# Patient Record
Sex: Male | Born: 1965 | Race: Black or African American | Hispanic: No | Marital: Single | Smoking: Current every day smoker
Health system: Southern US, Community
[De-identification: ages and names within clinical notes are randomized; demographics above are authoritative.]

## PROBLEM LIST (undated history)

## (undated) DIAGNOSIS — F209 Schizophrenia, unspecified: Secondary | ICD-10-CM

## (undated) DIAGNOSIS — J189 Pneumonia, unspecified organism: Secondary | ICD-10-CM

## (undated) DIAGNOSIS — M1990 Unspecified osteoarthritis, unspecified site: Secondary | ICD-10-CM

## (undated) DIAGNOSIS — I639 Cerebral infarction, unspecified: Secondary | ICD-10-CM

## (undated) DIAGNOSIS — M5136 Other intervertebral disc degeneration, lumbar region: Secondary | ICD-10-CM

## (undated) DIAGNOSIS — F32A Depression, unspecified: Secondary | ICD-10-CM

## (undated) DIAGNOSIS — R51 Headache: Secondary | ICD-10-CM

## (undated) DIAGNOSIS — F319 Bipolar disorder, unspecified: Secondary | ICD-10-CM

## (undated) DIAGNOSIS — R519 Headache, unspecified: Secondary | ICD-10-CM

## (undated) DIAGNOSIS — E119 Type 2 diabetes mellitus without complications: Secondary | ICD-10-CM

## (undated) DIAGNOSIS — M545 Low back pain, unspecified: Secondary | ICD-10-CM

## (undated) DIAGNOSIS — G8929 Other chronic pain: Secondary | ICD-10-CM

## (undated) DIAGNOSIS — M51369 Other intervertebral disc degeneration, lumbar region without mention of lumbar back pain or lower extremity pain: Secondary | ICD-10-CM

## (undated) DIAGNOSIS — F1310 Sedative, hypnotic or anxiolytic abuse, uncomplicated: Secondary | ICD-10-CM

## (undated) DIAGNOSIS — K219 Gastro-esophageal reflux disease without esophagitis: Secondary | ICD-10-CM

## (undated) DIAGNOSIS — I10 Essential (primary) hypertension: Secondary | ICD-10-CM

## (undated) DIAGNOSIS — F1210 Cannabis abuse, uncomplicated: Secondary | ICD-10-CM

## (undated) DIAGNOSIS — G43909 Migraine, unspecified, not intractable, without status migrainosus: Secondary | ICD-10-CM

## (undated) DIAGNOSIS — F329 Major depressive disorder, single episode, unspecified: Secondary | ICD-10-CM

## (undated) DIAGNOSIS — F1410 Cocaine abuse, uncomplicated: Secondary | ICD-10-CM

## (undated) DIAGNOSIS — F1010 Alcohol abuse, uncomplicated: Secondary | ICD-10-CM

## (undated) DIAGNOSIS — F419 Anxiety disorder, unspecified: Secondary | ICD-10-CM

## (undated) HISTORY — PX: NECK SURGERY: SHX720

## (undated) HISTORY — PX: BACK SURGERY: SHX140

## (undated) HISTORY — PX: LUMBAR DISC SURGERY: SHX700

---

## 1998-02-12 ENCOUNTER — Emergency Department (HOSPITAL_COMMUNITY): Admission: EM | Admit: 1998-02-12 | Discharge: 1998-02-12 | Payer: Self-pay | Admitting: Emergency Medicine

## 1998-02-25 ENCOUNTER — Encounter: Payer: Self-pay | Admitting: Family Medicine

## 1998-02-25 ENCOUNTER — Ambulatory Visit (HOSPITAL_COMMUNITY): Admission: RE | Admit: 1998-02-25 | Discharge: 1998-02-25 | Payer: Self-pay | Admitting: Family Medicine

## 1998-07-06 ENCOUNTER — Emergency Department (HOSPITAL_COMMUNITY): Admission: EM | Admit: 1998-07-06 | Discharge: 1998-07-06 | Payer: Self-pay | Admitting: Emergency Medicine

## 1999-01-08 ENCOUNTER — Emergency Department (HOSPITAL_COMMUNITY): Admission: EM | Admit: 1999-01-08 | Discharge: 1999-01-08 | Payer: Self-pay | Admitting: Emergency Medicine

## 1999-01-15 ENCOUNTER — Emergency Department (HOSPITAL_COMMUNITY): Admission: EM | Admit: 1999-01-15 | Discharge: 1999-01-15 | Payer: Self-pay

## 1999-02-03 ENCOUNTER — Encounter: Admission: RE | Admit: 1999-02-03 | Discharge: 1999-02-25 | Payer: Self-pay | Admitting: Orthopedic Surgery

## 1999-02-10 ENCOUNTER — Ambulatory Visit (HOSPITAL_COMMUNITY): Admission: RE | Admit: 1999-02-10 | Discharge: 1999-02-10 | Payer: Self-pay | Admitting: Orthopedic Surgery

## 1999-02-10 ENCOUNTER — Encounter: Payer: Self-pay | Admitting: Orthopedic Surgery

## 1999-03-29 ENCOUNTER — Encounter: Payer: Self-pay | Admitting: Emergency Medicine

## 1999-03-29 ENCOUNTER — Emergency Department (HOSPITAL_COMMUNITY): Admission: EM | Admit: 1999-03-29 | Discharge: 1999-03-29 | Payer: Self-pay | Admitting: *Deleted

## 1999-05-21 ENCOUNTER — Encounter: Payer: Self-pay | Admitting: Neurosurgery

## 1999-05-21 ENCOUNTER — Ambulatory Visit (HOSPITAL_COMMUNITY): Admission: RE | Admit: 1999-05-21 | Discharge: 1999-05-21 | Payer: Self-pay | Admitting: Neurosurgery

## 1999-05-24 ENCOUNTER — Encounter: Payer: Self-pay | Admitting: Neurosurgery

## 1999-05-28 ENCOUNTER — Encounter: Payer: Self-pay | Admitting: Neurosurgery

## 1999-05-29 ENCOUNTER — Encounter: Payer: Self-pay | Admitting: Neurosurgery

## 1999-05-29 ENCOUNTER — Observation Stay (HOSPITAL_COMMUNITY): Admission: RE | Admit: 1999-05-29 | Discharge: 1999-05-31 | Payer: Self-pay | Admitting: Neurosurgery

## 1999-08-07 ENCOUNTER — Encounter: Payer: Self-pay | Admitting: Emergency Medicine

## 1999-08-07 ENCOUNTER — Emergency Department (HOSPITAL_COMMUNITY): Admission: EM | Admit: 1999-08-07 | Discharge: 1999-08-07 | Payer: Self-pay | Admitting: Emergency Medicine

## 1999-08-13 ENCOUNTER — Emergency Department (HOSPITAL_COMMUNITY): Admission: EM | Admit: 1999-08-13 | Discharge: 1999-08-13 | Payer: Self-pay | Admitting: *Deleted

## 2000-10-11 ENCOUNTER — Encounter: Payer: Self-pay | Admitting: Emergency Medicine

## 2000-10-11 ENCOUNTER — Emergency Department (HOSPITAL_COMMUNITY): Admission: EM | Admit: 2000-10-11 | Discharge: 2000-10-12 | Payer: Self-pay | Admitting: Emergency Medicine

## 2000-10-23 ENCOUNTER — Emergency Department (HOSPITAL_COMMUNITY): Admission: EM | Admit: 2000-10-23 | Discharge: 2000-10-23 | Payer: Self-pay | Admitting: Emergency Medicine

## 2001-01-09 ENCOUNTER — Inpatient Hospital Stay (HOSPITAL_COMMUNITY): Admission: AD | Admit: 2001-01-09 | Discharge: 2001-01-14 | Payer: Self-pay | Admitting: Psychiatry

## 2002-10-17 ENCOUNTER — Emergency Department (HOSPITAL_COMMUNITY): Admission: EM | Admit: 2002-10-17 | Discharge: 2002-10-18 | Payer: Self-pay | Admitting: Emergency Medicine

## 2007-05-12 ENCOUNTER — Emergency Department (HOSPITAL_COMMUNITY): Admission: EM | Admit: 2007-05-12 | Discharge: 2007-05-12 | Payer: Self-pay | Admitting: Emergency Medicine

## 2007-06-13 ENCOUNTER — Emergency Department (HOSPITAL_COMMUNITY): Admission: EM | Admit: 2007-06-13 | Discharge: 2007-06-13 | Payer: Self-pay | Admitting: Emergency Medicine

## 2008-09-25 DIAGNOSIS — J189 Pneumonia, unspecified organism: Secondary | ICD-10-CM

## 2008-09-25 HISTORY — DX: Pneumonia, unspecified organism: J18.9

## 2008-10-16 ENCOUNTER — Ambulatory Visit: Payer: Self-pay | Admitting: Diagnostic Radiology

## 2008-10-16 ENCOUNTER — Emergency Department (HOSPITAL_BASED_OUTPATIENT_CLINIC_OR_DEPARTMENT_OTHER): Admission: EM | Admit: 2008-10-16 | Discharge: 2008-10-16 | Payer: Self-pay | Admitting: Emergency Medicine

## 2008-12-19 ENCOUNTER — Emergency Department (HOSPITAL_BASED_OUTPATIENT_CLINIC_OR_DEPARTMENT_OTHER): Admission: EM | Admit: 2008-12-19 | Discharge: 2008-12-19 | Payer: Self-pay | Admitting: Emergency Medicine

## 2009-03-05 ENCOUNTER — Emergency Department (HOSPITAL_COMMUNITY): Admission: EM | Admit: 2009-03-05 | Discharge: 2009-03-05 | Payer: Self-pay | Admitting: Emergency Medicine

## 2009-03-06 ENCOUNTER — Ambulatory Visit: Payer: Self-pay | Admitting: Vascular Surgery

## 2009-07-02 ENCOUNTER — Emergency Department (HOSPITAL_BASED_OUTPATIENT_CLINIC_OR_DEPARTMENT_OTHER): Admission: EM | Admit: 2009-07-02 | Discharge: 2009-07-02 | Payer: Self-pay | Admitting: Emergency Medicine

## 2010-01-13 ENCOUNTER — Emergency Department (HOSPITAL_BASED_OUTPATIENT_CLINIC_OR_DEPARTMENT_OTHER)
Admission: EM | Admit: 2010-01-13 | Discharge: 2010-01-13 | Payer: Self-pay | Source: Home / Self Care | Admitting: Emergency Medicine

## 2010-05-01 LAB — DIFFERENTIAL
Basophils Relative: 3 % — ABNORMAL HIGH (ref 0–1)
Eosinophils Absolute: 0.2 10*3/uL (ref 0.0–0.7)
Monocytes Relative: 9 % (ref 3–12)
Neutrophils Relative %: 71 % (ref 43–77)

## 2010-05-01 LAB — CBC
MCHC: 34.6 g/dL (ref 30.0–36.0)
MCV: 87 fL (ref 78.0–100.0)
Platelets: 333 10*3/uL (ref 150–400)

## 2010-05-01 LAB — BASIC METABOLIC PANEL
BUN: 15 mg/dL (ref 6–23)
CO2: 31 mEq/L (ref 19–32)
Chloride: 102 mEq/L (ref 96–112)
Creatinine, Ser: 1.2 mg/dL (ref 0.4–1.5)
Glucose, Bld: 111 mg/dL — ABNORMAL HIGH (ref 70–99)

## 2010-06-09 NOTE — Consult Note (Signed)
NEW PATIENT CONSULTATION   Lonnie [name], Lonnie [name]  DOB:  1965-10-21                                       03/06/2009  ZOXWR#:[id number]   I saw the patient in the office today in consultation concerning pain in  his left first, second and third toes.  He was referred by the emergency  department at [facility].  This is a pleasant 45 year old  gentleman who states that approximately a month ago he noted the fairly  sudden onset of pain in the first, second and third toes of his left  foot.  Over the last month the pain gradually worsened.  He experienced  pain whenever he was putting his socks on and then when there was any  pressure on his toes.  The only alleviating factor has been pain  medicines when he received these in the emergency room last night.  He  does describe some mild pain in his distal left foot but it does not  sound like classic calf claudication.  He has had no real rest pain.  He  has had no history of nonhealing wounds.  He did have his left great  toenail removed about 4 years ago.  He has had no associated symptoms  besides the pain in his toes.  He has not really noticed any  discoloration in his toes.   PAST MEDICAL HISTORY:  Significant for adult onset diabetes.  He does  not require insulin.  This has been stable on his current medications.  He also states that he has had some history of hypertension in the past  but has not had a problem with this recently.  He denies any history of  hypercholesterolemia, history of previous myocardial infarction, history  of congestive heart failure or history of COPD.   FAMILY HISTORY:  There is no history of premature cardiovascular  disease.   SOCIAL HISTORY:  He is single.  He has three children.  He smokes  occasional cigars but denies using cigarettes currently.  He has used  cigarettes in the past.   REVIEW OF SYSTEMS:  GENERAL:  He has had no recent weight loss, weight  gain or problems  with his appetite.  He has had some occasional chest  tightness which is relieved by stretching.  He has had no palpitations  or arrhythmias.  PULMONARY:  He has had occasional productive cough, occasional wheezing.  He has had no bronchitis or asthma.  GI:  He has had some reflux and history of diarrhea and constipation.  GU:  He has had some urinary frequency.  He has had no dysuria.  NEUROLOGIC:  He has had no history of stroke, TIAs or amaurosis fugax.  He has had no history of DVT or phlebitis.  He has occasional dizziness  and occasional headaches.  MUSCULOSKELETAL:  He does have occasional joint pain and muscle pain.  ENT:  He states that he has had some problems with his eyesight  recently.  He has had no change in hearing.  No history of nosebleeds or  sore throat.  Psychiatric, hematologic review of systems is unremarkable.  INTEGUMENTARY:  He does state he has had a callus on the bottom of his  right foot.   PHYSICAL EXAMINATION:  General:  This is a pleasant 45 year old  gentleman who appears his stated age.  Vital signs:  His blood pressure  is 124/74, respiratory rate 20, heart rate is 80.  HEENT:  Is  unremarkable.  Lungs:  Are clear bilaterally to auscultation without  rales, rhonchi or wheezing.  Cardiovascular:  I do not detect any  carotid bruits.  He has a regular rate and rhythm without murmur  appreciated.  He has no significant peripheral edema.  He has palpable  radial pulses bilaterally.  He has palpable femoral, popliteal, dorsalis  pedis and posterior tibial pulses bilaterally.  He has no ischemic  ulcers on his feet.  No discoloration of his toes.  Abdomen:  Soft and  nontender with normal pitched bowel sounds.  No masses appreciated.  I  cannot palpate an aneurysm.  Musculoskeletal:  He has no major  deformities or cyanosis.  There is no discoloration to his toes.  No  cyanosis of the toes.  Neurological:  He has no focal weakness or  paresthesias.  Skin:   He does have a callus on the plantar aspect of his  right foot.  He has no ulcers.  Lymphatic:  He has no significant  cervical, axillary or inguinal lymphadenopathy.   I did independently interpret his arterial Doppler study today which  shows biphasic Doppler signals in the dorsalis pedis and posterior  tibial positions bilaterally.  He has an ABI of 100% bilaterally.  Toe  pressure on the right is 103 mmHg and on the left 102 mmHg.   I have reassured the patient that based on his exam and Doppler study he  has no evidence of significant arterial insufficiency.  He has had some  pain in his toes but I really do not see any evidence of atheroembolic  disease.  He does not have any significant risk factors for having  proximal disease and there is no discoloration in the toes to suggest  atheroembolic disease.  He does have a history of diabetes and certainly  some of his pain could be neuropathy.  The other possibility would be  gout.  I have recommended that he see a podiatrist and they can do x-  rays at the office or he can be seen at the [facility] and we will try  to make these referrals.  Again, however, I reassured him that he has no  evidence of significant peripheral vascular disease and I think it is  very unlikely he has had any type of an embolic event.  We will see him  back p.r.n.     Di Kindle. Edilia Bo, M.D.  Electronically Signed   CSD/MEDQ  D:  03/06/2009  T:  03/07/2009  Job:  [id number]

## 2010-06-12 NOTE — H&P (Signed)
[facility]  Patient:    Lonnie [name], Lonnie [name] Visit Number: [id number] MRN: [id number]          Service Type: PSY Location: 300 0301 01 Attending Physician:  Jeanice Lim Dictated by:   Candi Leash. Orsini, N.P. Admit Date:  01/09/2001                     Psychiatric Admission Assessment  IDENTIFYING INFORMATION:  A 45 year old divorced African-American male involuntarily committed for depression and suicidal ideation and polysubstance abuse.  HISTORY OF PRESENT ILLNESS:  The patient presents with a history of alcohol abuse, cocaine dependence, and depressive symptoms for months that had increased, was having suicidal thoughts, the patients thoughts to smoke enough cocaine to blow his heart up.  He reports no sleep for the past 5 nights.  He is experiencing positive auditory hallucinations to kill himself, having a decreased appetite with an 18 pound weight loss.  No current suicidal or homicidal ideations, no psychosis.  He has been drinking 4-5 22-ounce beers for the past few days to calm himself down from cocaine binges.  The patient denies any withdrawal symptoms at present.  PAST PSYCHIATRIC HISTORY:  First visit to [facility].  He was detoxed in September at [facility], had been sober for 1-1/2 months.  Longest period of sobriety was in 1994 and clean for 3 years.  SOCIAL HISTORY:  He is a 45 year old divorced African-American male, divorced for 15 years, 2 children ages 107 and 21.  He lives alone, no legal charges.  He has completed 1 year of college.  He is unemployed.  FAMILY HISTORY:  None.  ALCOHOL DRUG HISTORY:  The patient smokes cigarettes, has been drinking 4 to 5 22-ounce beers for the past several days.  Last drink was on Sunday morning. Has been using crack cocaine for the past 15 years every day and reports a history of blackouts.  PAST MEDICAL HISTORY:  Primary care Wanell Lorenzi is none.  Medical problems  are none.  Medications are none.  DRUG ALLERGIES:  No known allergies.  PHYSICAL EXAMINATION  REVIEW OF SYSTEMS:  The patient had back surgery in the year 2000.  No other current medical problems.  Occasional migraine headaches.  No heart rate or pulmonary symptoms.  No endocrine or GI problems.  Patient reports some history of insomnia.  GENERAL APPEARANCE:    The patient is 5 feet 11 inches tall.  He is 184 pounds.  Last vital signs 97.6, 73, 24, blood pressure 123/67.  Patient is a 45 year old African-American male, well-nourished, in no acute distress. HEAD:  Normocephalic.  His hair is short, clean and of equal distribution. EYES:  His EOMs are intact bilaterally.  No sinus tenderness or nasal discharge.  Mucosa is moist with good dentition.  NECK:  Supple, no JVD, negative lymphadenopathy.  Thyroid is nonpalpable and nontender.  CHEST:  Clear to auscultation.  No cough.  HEART:  Regular rate and rhythm, without murmurs, gallops or rubs. GENITALIA EXAM is deferred.  ABDOMEN:  Soft, nontender abdomen.  No CVA tenderness.  MUSCULOSKELETAL:  No joint swelling or deformity.  Good range of motion. SKIN:  Warm and dry with good turgor.  No lacerations or rashes.  NEUROLOGIC:  He is oriented x 3.  Cranial nerves are grossly intact.  LABORATORY DATA:  CBC is within normal limits.  Total protein is decreased at 5.8.  MENTAL STATUS EXAMINATION:  He is an awake but sleep, well-nourished African-American  male.  He is cooperative, casually dressed,  neatly dressed. Speech is normal and relevant.  Mood is depressed.  Affect is difficult to ascertain.  The patient is too sleepy.  Thought processes are coherent.  There is no current auditory or visual hallucinations.  He does not appear to be responding to any internal stimuli.  No suicidal or homicidal ideations, no paranoia.  Cognitively, he is oriented x 3.  Judgment is impaired.  Insight is fair.  Poor impulse  control.  ADMISSION DIAGNOSES: Axis I:    1. Mood disorder not otherwise specified.            2. Alcohol abuse.            3. Cocaine dependence. Axis II:   Deferred. Axis III:  None. Axis IV:   Mild to moderate, psychosocial problems. Axis V:    Current 30, estimated this past year 52.  INITIAL PLAN OF CARE:  Involuntary commitment for depression and suicidal ideation, alcohol and cocaine abuse.  Contract for safety, check every 15 minutes.  The patient promises safety.  We will obtain labs and patient is to attend groups.  We will initiate an antidepressant, have Symmetrel available for withdrawal symptoms and Zyprexa for sleep and psychosis.  Will monitor withdrawal symptoms.  Our goal is to stabilize mood and thinking so the patient can be safe, for patient to be detoxed safely, for patient to remain drug and alcohol free after discharge, attend mental health and AA and NA meeting.  TENTATIVE LENGTH OF STAY:  4 to 5 days.Dictated by:   Candi Leash. Orsini, N.P.  Attending Physician:  Jeanice Lim DD:  01/11/01 TD:  01/12/01 Job: [id number]
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
## 2010-06-12 NOTE — Discharge Summary (Signed)
[facility]  Patient:    THAILAND, DUBE Visit Number: [id number] MRN: [id number]          Service Type: PSY Location: 300 0301 01 Attending Physician:  Jeanice Lim Dictated by:   Jeanice Lim, M.D. Admit Date:  01/09/2001 Discharge Date: 01/14/2001                             Discharge Summary  IDENTIFYING DATA:             Buchmann is a 45 year old divorced, African-American male who voluntarily committed for depression, suicidal ideation, polysubstance abuse.  ALLERGIES:                    No known drug allergies.  PHYSICAL EXAMINATION:         Unremarkable.  Neurologically, nonfocal.  ROUTINE ADMISSION LABS:       Within normal limits including CBC with differential, comprehensive metabolic panel, thyroid panel with a normal TSH of 0.793.  MENTAL STATUS EXAMINATION:    Awake, sleepy, well-nourished, African-American male, casually dressed, neat.  Speech was within normal limits.  Mood depressed.  Affect somewhat restricted.  Thought process goal directed by content.  No current psychotic symptoms.  He denied suicidal or homicidal ideation.  Cognitively intact.  Judgment and insight were considered impaired.  ADMITTING DIAGNOSES: Axis I;    1. Alcohol abuse.            2. Cocaine dependence.            3. Mood disorder, not otherwise specified. Axis II:   None. Axis III:  None. Axis IV:   Moderate, other psychosocial problems. Axis V:    30/65.  Routine p.r.n. medications were ordered.  The patient had been on a 7-day binge with marijuana, alcohol and cocaine and reported auditory hallucinations with suicidal content.  He was given thiamine and multivitamin, phenobarbital p.r.n. withdrawal symptoms and amantadine for cocaine cravings and Gatorade for potassium supplementation, Wellbutrin was optimized and Zyprexa scheduled for psychotic symptoms and Wellbutrin targeting depressive symptoms.  The patient reported oversedation from  Zyprexa and requested Seroquel which was optimized and the patient reported a positive response to medications and no side effects.  He denied active withdrawal symptoms and his condition at discharge was improved.  Mood was more euthymic.  Affect bright.  Thought process goal directed for content.  Negative for psychotic symptoms.  No dangerous ideation.  The patient reported motivation to be sober and to be compliant with follow up plans.  DISCHARGE MEDICATIONS: 1. Seroquel 150 mg q.h.s. 2. Symmetrel 100 mg b.i.d. 3. Wellbutrin SR 150 mg q.a.m. and q.3 p.m.  DISCHARGE DIAGNOSES: Axis I:    1. Alcohol abuse.            2. Cocaine dependence.            3. Mood disorder, not otherwise specified. Axis II:   None. Axis III:  None. Axis IV:   Moderate, other psychosocial problems. Axis V:    GAF on discharge was 55.  FURTHER EVALUATION AND TREATMENT RECOMMENDATIONS:    He was discharged to follow up with the mental health center for medication management and substance abuse       treatment. ictated by:   Jeanice Lim, M.D. Attending Physician:  Jeanice Lim DD:  02/21/01 TD:  02/21/01 Job: [id number]

## 2010-06-12 NOTE — Discharge Summary (Signed)
[place]. [facility]  Patient:    Lonnie [name], Lonnie [name]                       MRN: [id number] Adm. Date:  [id number] Disc. Date: [id number] Attending:  Coletta Memos                           Discharge Summary  REASON FOR ADMISSION: 1. Herniated lumbar disk L5-S1 left. 2. Left lumbar radiculopathy.  FINAL DIAGNOSES: 1. Herniated lumbar disk L5-S1 left. 2. Left lumbar radiculopathy.  HISTORY OF PRESENT ILLNESS AND HOSPITAL COURSE:  Lonnie [name] is a 45 year old man with significant left lower extremity radicular pain.  He underwent L4-5 microdiskectomy on the left.  He had improvement in his radicular pain. Postoperatively, he had some persistent incisional pain.  On May 30, 1999, complained of some crushing chest pain on the left.  An EKG was obtained, which was normal.  The patient had improvement of his chest pain with Mylanta. He was observed in the hospital after that, had no recurrent episodes of chest pain.  Was doing well on May 31, 1999, and was up and ambulatory with some persistent incisional pain, but much better than the previous day.  His incision appeared to be healing well.  He was discharged home.  DISCHARGE MEDICATIONS: 1. Percocet 5/325 1-2 every four hours as needed for pain. 2. Valium 5 mg up to every six hours as needed for spasm.  DISCHARGE INSTRUCTIONS:  No lifting, bending, twisting, or driving.  Okay to shower, but not to soak his incision.  FOLLOW-UP:  In two weeks with Dr. Franky Macho. DD:  05/31/99 TD:  06/01/99 Job: [id number]

## 2010-10-20 LAB — URINALYSIS, ROUTINE W REFLEX MICROSCOPIC
Hgb urine dipstick: NEGATIVE
Protein, ur: NEGATIVE
Urobilinogen, UA: 2 — ABNORMAL HIGH

## 2011-05-27 ENCOUNTER — Emergency Department (HOSPITAL_BASED_OUTPATIENT_CLINIC_OR_DEPARTMENT_OTHER)
Admission: EM | Admit: 2011-05-27 | Discharge: 2011-05-28 | Disposition: A | Discharge status: Home / Self Care | Payer: Self-pay | Attending: Emergency Medicine | Admitting: Emergency Medicine

## 2011-05-27 ENCOUNTER — Emergency Department (INDEPENDENT_AMBULATORY_CARE_PROVIDER_SITE_OTHER): Payer: Self-pay

## 2011-05-27 ENCOUNTER — Emergency Department (HOSPITAL_BASED_OUTPATIENT_CLINIC_OR_DEPARTMENT_OTHER): Payer: Self-pay

## 2011-05-27 ENCOUNTER — Encounter (HOSPITAL_BASED_OUTPATIENT_CLINIC_OR_DEPARTMENT_OTHER): Payer: Self-pay | Admitting: *Deleted

## 2011-05-27 DIAGNOSIS — S62639A Displaced fracture of distal phalanx of unspecified finger, initial encounter for closed fracture: Secondary | ICD-10-CM

## 2011-05-27 DIAGNOSIS — W230XXA Caught, crushed, jammed, or pinched between moving objects, initial encounter: Secondary | ICD-10-CM | POA: Insufficient documentation

## 2011-05-27 DIAGNOSIS — S62639B Displaced fracture of distal phalanx of unspecified finger, initial encounter for open fracture: Secondary | ICD-10-CM | POA: Insufficient documentation

## 2011-05-27 DIAGNOSIS — S6710XA Crushing injury of unspecified finger(s), initial encounter: Secondary | ICD-10-CM

## 2011-05-27 DIAGNOSIS — X58XXXA Exposure to other specified factors, initial encounter: Secondary | ICD-10-CM
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
## 2011-05-28 MED ORDER — BUPIVACAINE HCL 0.5 % IJ SOLN
INTRAMUSCULAR | Status: AC
  Administered 2011-05-28: 1 mL
  Filled 2011-05-28: qty 1

## 2011-05-28 MED ORDER — HYDROCODONE-ACETAMINOPHEN 5-325 MG PO TABS: 1.0000 | ORAL_TABLET | Freq: Four times a day (QID) | ORAL | Status: AC | PRN

## 2011-05-28 MED ORDER — HYDROCODONE-ACETAMINOPHEN 5-325 MG PO TABS
1.0000 | ORAL_TABLET | Freq: Once | ORAL | Status: AC
  Administered 2011-05-28: 1 via ORAL
  Filled 2011-05-28: qty 1

## 2011-05-28 MED ORDER — CEPHALEXIN 500 MG PO CAPS: 500.0000 mg | ORAL_CAPSULE | Freq: Four times a day (QID) | ORAL | Status: AC

## 2011-05-28 MED ORDER — BUPIVACAINE HCL 0.5 % IJ SOLN
50.0000 mL | Freq: Once | INTRAMUSCULAR | Status: AC
  Administered 2011-05-28: 1 mL

## 2011-05-28 MED ORDER — CEPHALEXIN 250 MG PO CAPS
500.0000 mg | ORAL_CAPSULE | Freq: Once | ORAL | Status: AC
  Administered 2011-05-28: 500 mg via ORAL
  Filled 2011-05-28: qty 2

## 2011-05-28 NOTE — Discharge Instructions (Signed)
Crush Injury, Fingers or Toes  A crush injury to the fingers or toes means the tissues have been damaged by being squeezed (compressed). There will be bleeding into the tissues and swelling. Often, blood will collect under the skin. When this happens, the skin on the finger often dies and may slough off (shed) 1 week to 10 days later. Usually, new skin is growing underneath. If the injury has been too severe and the tissue does not survive, the damaged tissue may begin to turn black over several days.   Wounds which occur because of the crushing may be stitched (sutured) shut. However, crush injuries are more likely to become infected than other injuries.These wounds may not be closed as tightly as other types of cuts to prevent infection. Nails involved are often lost. These usually grow back over several weeks.   DIAGNOSIS  X-rays may be taken to see if there is any injury to the bones.  TREATMENT  Broken bones (fractures) may be treated with splinting, depending on the fracture. Often, no treatment is required for fractures of the last bone in the fingers or toes.  HOME CARE INSTRUCTIONS    The crushed part should be raised (elevated) above the heart or center of the chest as much as possible for the first several days or as directed. This helps with pain and lessens swelling. Less swelling increases the chances that the crushed part will survive.   Put ice on the injured area.   Put ice in a plastic bag.   Place a towel between your skin and the bag.   Leave the ice on for 15 to 20 minutes, 3 to 4 times a day for the first 2 days.   Only take over-the-counter or prescription medicines for pain, discomfort, or fever as directed by your caregiver.   Use your injured part only as directed.   Change your bandages (dressings) as directed.   Keep all follow-up appointments as directed by your caregiver. Not keeping your appointment could result in a chronic or permanent injury, pain, and disability. If there  is any problem keeping the appointment, you must call to reschedule.  SEEK IMMEDIATE MEDICAL CARE IF:    There is redness, swelling, or increasing pain in the wound area.   Pus is coming from the wound.   You have a fever.   You notice a bad smell coming from the wound or dressing.   The edges of the wound do not stay together after the sutures have been removed.   You are unable to move the injured finger or toe.  MAKE SURE YOU:    Understand these instructions.   Will watch your condition.   Will get help right away if you are not doing well or get worse.  Document Released: 01/11/2005 Document Revised: 12/31/2010 Document Reviewed: 05/29/2010  [employer] Patient Information 2012 [employer].

## 2011-05-28 NOTE — ED Provider Notes (Signed)
History     CSN: [id number]  Arrival date & time 05/27/11  2320   First MD Initiated Contact with Patient 2011/06/03 [phone]      Chief Complaint  Patient presents with  . Finger Injury    (Consider location/radiation/quality/duration/timing/severity/associated sxs/prior treatment) HPI Is a 46 year old black male who states that her beam fell on his right middle finger at work about 4 hours ago. He has subsequently gradually developed severe pain in that finger tip. There is swelling and blood noted under the fingernail. Pain is worse with palpation or movement. He has not taken any medication to use for pain. He denies other injury.  History reviewed. No pertinent past medical history.  Past Surgical History  Procedure Date  . Back surgery     History reviewed. No pertinent family history.  History  Substance Use Topics  . Smoking status: Current Everyday Smoker  . Smokeless tobacco: Not on file  . Alcohol Use: No      Review of Systems  All other systems reviewed and are negative.    Allergies  Review of patient's allergies indicates no known allergies.  Home Medications  No current outpatient prescriptions on file.  BP 126/107  Pulse 92  Temp(Src) 98.3 F (36.8 C) (Oral)  Resp 22  SpO2 100%  Physical Exam General: Well-developed, well-nourished male in no acute distress; appearance consistent with age of record HENT: normocephalic, atraumatic Eyes: pupils equal round and reactive to light; extraocular muscles intact Neck: supple Heart: regular rate and rhythm Lungs: Normal respiratory effort and excursion Abdomen: soft; nondistended Extremities: No deformity; full range of motion; swelling and tenderness of distal phalanx of left middle finger with small (3-25mm) laceration of the proximal part of the left middle finger pad; large subungual hematoma of the left middle fingernail Neurologic: Awake, alert and oriented; motor function intact in all extremities  and symmetric; no facial droop Skin: Warm and dry    ED Course  Procedures (including critical care time)  SUBUNGUAL HEMATOMA DRAINAGE A standard battery-powered pen cautery was used to puncture the nail of the left middle finger. There was a subsequent release of blood. The patient tolerated this well and there were no immediate complications.  DIGITAL BLOCK The patient's left little finger was blocked with approximately 5 mL of 0.5% tetracaine without epinephrine. 1:28 AM Adequate anesthesia achieved. Patient comfortable.   MDM   Nursing notes and vitals signs, including pulse oximetry, reviewed.  Summary of this visit's results, reviewed by myself:   Imaging Studies: Dg Finger Middle Right  Jun 03, 2011  *RADIOLOGY REPORT*  Clinical Data: Right third digit pain status post trauma.  RIGHT MIDDLE FINGER 2+V  Comparison: None.  Findings: There is a fracture through the radial volar aspect of the tip of the distal phalanx third digit.  No dislocation.  No additional osseous abnormality.  IMPRESSION: Fracture at the tip of the distal phalanx third digit as above.  Original Report Authenticated By: Waneta Martins, M.D.    1:11 AM The laceration's location is proximal to the fracture seen in the gradient graft. This distances such that this most likely does not represent an open fracture. We will nevertheless treat with antibiotics and refer to hand surgery.        Hanley Seamen, MD 06/03/2011 [phone]

## 2011-09-06 ENCOUNTER — Emergency Department (HOSPITAL_BASED_OUTPATIENT_CLINIC_OR_DEPARTMENT_OTHER): Payer: Self-pay

## 2011-09-06 ENCOUNTER — Encounter (HOSPITAL_BASED_OUTPATIENT_CLINIC_OR_DEPARTMENT_OTHER): Payer: Self-pay | Admitting: *Deleted

## 2011-09-06 ENCOUNTER — Emergency Department (HOSPITAL_BASED_OUTPATIENT_CLINIC_OR_DEPARTMENT_OTHER)
Admission: EM | Admit: 2011-09-06 | Discharge: 2011-09-06 | Disposition: A | Discharge status: Home / Self Care | Payer: Self-pay | Attending: Emergency Medicine | Admitting: Emergency Medicine

## 2011-09-06 DIAGNOSIS — S060X9A Concussion with loss of consciousness of unspecified duration, initial encounter: Secondary | ICD-10-CM | POA: Insufficient documentation

## 2011-09-06 DIAGNOSIS — H7290 Unspecified perforation of tympanic membrane, unspecified ear: Secondary | ICD-10-CM | POA: Insufficient documentation

## 2011-09-06 DIAGNOSIS — S060XAA Concussion with loss of consciousness status unknown, initial encounter: Secondary | ICD-10-CM | POA: Insufficient documentation

## 2011-09-06 DIAGNOSIS — F17200 Nicotine dependence, unspecified, uncomplicated: Secondary | ICD-10-CM | POA: Insufficient documentation

## 2011-09-06 MED ORDER — ONDANSETRON 8 MG PO TBDP
8.0000 mg | ORAL_TABLET | Freq: Once | ORAL | Status: AC
  Administered 2011-09-06: 8 mg via ORAL
  Filled 2011-09-06: qty 1

## 2011-09-06 MED ORDER — PROMETHAZINE HCL 25 MG PO TABS: 25.0000 mg | ORAL_TABLET | Freq: Four times a day (QID) | ORAL | Status: DC | PRN

## 2011-09-06 MED ORDER — HYDROCODONE-ACETAMINOPHEN 5-325 MG PO TABS: 2.0000 | ORAL_TABLET | ORAL | Status: AC | PRN

## 2011-09-06 MED ORDER — HYDROCODONE-ACETAMINOPHEN 5-325 MG PO TABS
2.0000 | ORAL_TABLET | Freq: Once | ORAL | Status: AC
  Administered 2011-09-06: 2 via ORAL
  Filled 2011-09-06: qty 2

## 2011-09-06 NOTE — ED Provider Notes (Signed)
Medical screening examination/treatment/procedure(s) were performed by non-physician practitioner and as supervising physician I was immediately available for consultation/collaboration.   Charles B. Bernette Mayers, MD 09/06/11 [phone]
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
## 2011-09-06 NOTE — ED Notes (Signed)
Pt c/o nausea-requested snack-EDPA advised-orders for NPO until xray resulted and zofran odt
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
## 2011-09-27 ENCOUNTER — Encounter (HOSPITAL_BASED_OUTPATIENT_CLINIC_OR_DEPARTMENT_OTHER): Payer: Self-pay | Admitting: *Deleted

## 2011-09-27 ENCOUNTER — Emergency Department (HOSPITAL_BASED_OUTPATIENT_CLINIC_OR_DEPARTMENT_OTHER)
Admission: EM | Admit: 2011-09-27 | Discharge: 2011-09-27 | Disposition: A | Discharge status: Home / Self Care | Payer: Self-pay | Attending: Emergency Medicine | Admitting: Emergency Medicine

## 2011-09-27 DIAGNOSIS — F17200 Nicotine dependence, unspecified, uncomplicated: Secondary | ICD-10-CM | POA: Insufficient documentation

## 2011-09-27 DIAGNOSIS — M545 Low back pain, unspecified: Secondary | ICD-10-CM

## 2011-09-27 DIAGNOSIS — M5416 Radiculopathy, lumbar region: Secondary | ICD-10-CM

## 2011-09-27 MED ORDER — CYCLOBENZAPRINE HCL 10 MG PO TABS: 10.0000 mg | ORAL_TABLET | Freq: Two times a day (BID) | ORAL | Status: DC | PRN

## 2011-09-27 MED ORDER — OXYCODONE-ACETAMINOPHEN 5-325 MG PO TABS: 1.0000 | ORAL_TABLET | ORAL | Status: AC | PRN

## 2011-09-27 MED ORDER — OXYCODONE-ACETAMINOPHEN 5-325 MG PO TABS
1.0000 | ORAL_TABLET | Freq: Once | ORAL | Status: AC
  Administered 2011-09-27: 1 via ORAL
  Filled 2011-09-27 (×2): qty 1

## 2011-09-27 MED ORDER — CYCLOBENZAPRINE HCL 10 MG PO TABS
10.0000 mg | ORAL_TABLET | Freq: Once | ORAL | Status: AC
  Administered 2011-09-27: 10 mg via ORAL
  Filled 2011-09-27: qty 1

## 2011-09-27 MED ORDER — IBUPROFEN 800 MG PO TABS
800.0000 mg | ORAL_TABLET | Freq: Once | ORAL | Status: AC
  Administered 2011-09-27: 800 mg via ORAL
  Filled 2011-09-27: qty 1

## 2011-09-27 MED ORDER — IBUPROFEN 800 MG PO TABS: 800.0000 mg | ORAL_TABLET | Freq: Three times a day (TID) | ORAL | Status: AC

## 2011-09-27 NOTE — ED Provider Notes (Signed)
History     CSN: [id number]  Arrival date & time 09/27/11  1215   First MD Initiated Contact with Patient 09/27/11 1344      Chief Complaint  Patient presents with  . Back Pain    (Consider location/radiation/quality/duration/timing/severity/associated sxs/prior treatment) HPI Comments: Patient is here today with the chief complaint of low back pain. He states his pain began a couple days ago and has progressively gotten worse. Yesterday his back pain was the worst it has been and he was unable to move due to the pain. The pain is shooting and radiates down his left leg and he reports numbness, tingling and a pins and needles sensation in his left foot. He is not taking anything for the pain including muscle relaxer or NSAIDs. He does not see anyone for his back. Positional change makes the pain worse and he is walking sideways due to the pain. . Past medical history is significant for degenerative disc disease and back surgery 10-12 years ago. He denies any history of diabetes. Denies headache, change in bowel or bladder function or trauma to his back.  Patient is a 46 y.o. male presenting with back pain. The history is provided by the patient.  Back Pain  This is a new problem. The current episode started more than 2 days ago. The problem has been gradually worsening. The pain is associated with no known injury. The pain is present in the lumbar spine. The quality of the pain is described as shooting. The pain radiates to the left foot. The pain is severe. The symptoms are aggravated by certain positions, bending and twisting. The pain is the same all the time. Associated symptoms include numbness, leg pain, paresthesias and tingling. Pertinent negatives include no chest pain, no headaches, no bowel incontinence, no bladder incontinence and no weakness. He has tried nothing for the symptoms.    History reviewed. No pertinent past medical history.  Past Surgical History  Procedure Date  .  Back surgery     No family history on file.  History  Substance Use Topics  . Smoking status: Current Everyday Smoker  . Smokeless tobacco: Not on file  . Alcohol Use: No      Review of Systems  Cardiovascular: Negative for chest pain and leg swelling.  Gastrointestinal: Negative for bowel incontinence.  Genitourinary: Negative for bladder incontinence.  Musculoskeletal: Positive for back pain.  Neurological: Positive for tingling, numbness and paresthesias. Negative for weakness and headaches.    Allergies  Review of patient's allergies indicates no known allergies.  Home Medications   Current Outpatient Rx  Name Route Sig Dispense Refill  . ACETAMINOPHEN 325 MG PO TABS Oral Take 650 mg by mouth every 6 (six) hours as needed. For pain.    Marland Kitchen PROMETHAZINE HCL 25 MG PO TABS Oral Take 1 tablet (25 mg total) by mouth every 6 (six) hours as needed for nausea. 30 tablet 0    BP 94/65  Pulse 74  Temp 97.8 F (36.6 C) (Oral)  Resp 20  SpO2 100%  Physical Exam  Constitutional: He is oriented to person, place, and time. He appears well-developed and well-nourished.  Eyes: Pupils are equal, round, and reactive to light.  Cardiovascular: Normal rate, regular rhythm, normal heart sounds and intact distal pulses.   Pulses:      Dorsalis pedis pulses are 2+ on the right side, and 2+ on the left side.       Posterior tibial pulses are 2+ on the  right side, and 2+ on the left side.  Pulmonary/Chest: Effort normal and breath sounds normal.  Musculoskeletal:       Tenderness to palpation over lumbar spine. Active ROM in lumbar spine limited to pain. Strength 5/5 bilaterally in lower extremities. Full ROM in lower extremities bilaterally. No scoliosis or kyphosis.  Neurological: He is alert and oriented to person, place, and time. He is not disoriented. No cranial nerve deficit.       Sensation intact in lower extremities bilaterally    ED Course  Procedures (including critical  care time)  Labs Reviewed - No data to display No results found.   No diagnosis found. 1. Lower back pain   MDM  Exam and history support musculoskeletal back pain, possibly disc related. Will treat conservatively and refer to ortho for persistent symptoms. Patient comfortable with care plan.         Rodena Medin, PA-C 09/27/11 2249

## 2011-09-27 NOTE — ED Notes (Addendum)
Back pain. States he is trying to get disability. States he has had the pain for years. He rates his pain a 10/10 and has not taken any medications for the pain.

## 2011-09-27 NOTE — ED Provider Notes (Signed)
Medical screening examination/treatment/procedure(s) were performed by non-physician practitioner and as supervising physician I was immediately available for consultation/collaboration.   Carleene Cooper III, MD 09/27/11 [phone]

## 2011-10-13 ENCOUNTER — Ambulatory Visit: Payer: Self-pay | Admitting: Family Medicine

## 2011-10-14 ENCOUNTER — Encounter: Payer: Self-pay | Admitting: Family Medicine

## 2011-10-14 ENCOUNTER — Ambulatory Visit (INDEPENDENT_AMBULATORY_CARE_PROVIDER_SITE_OTHER): Payer: Self-pay | Admitting: Family Medicine

## 2011-10-14 ENCOUNTER — Ambulatory Visit: Payer: Self-pay | Admitting: Family Medicine

## 2011-10-14 VITALS — BP 118/77 | HR 93 | Ht 71.0 in | Wt 180.0 lb

## 2011-10-14 DIAGNOSIS — G8929 Other chronic pain: Secondary | ICD-10-CM

## 2011-10-14 DIAGNOSIS — M545 Low back pain: Secondary | ICD-10-CM

## 2011-10-14 MED ORDER — CYCLOBENZAPRINE HCL 10 MG PO TABS: 10.0000 mg | ORAL_TABLET | Freq: Three times a day (TID) | ORAL | Status: AC | PRN

## 2011-10-14 MED ORDER — MELOXICAM 15 MG PO TABS: 15.0000 mg | ORAL_TABLET | Freq: Every day | ORAL | Status: DC

## 2011-10-14 MED ORDER — TRAMADOL HCL 50 MG PO TABS: 50.0000 mg | ORAL_TABLET | Freq: Three times a day (TID) | ORAL | Status: DC | PRN

## 2011-10-14 MED ORDER — PREDNISONE (PAK) 10 MG PO TABS: ORAL_TABLET | ORAL | Status: DC

## 2011-10-14 NOTE — Patient Instructions (Addendum)
You have lumbar radiculopathy (a pinched nerve in your low back). Take tylenol for baseline pain relief (1-2 extra strength tabs 3x/day) A prednisone dose pack is the best option for immediate relief and may be prescribed with transition to an anti-inflammatory like meloxicam (if you do not have stomach or kidney issues). Tramadol as needed for severe pain (no driving on this medicine). Flexeril as needed for muscle spasms (no driving on this medicine if it makes you sleepy). Stay as active as possible. Physical therapy has been shown to be helpful as well. Strengthening of low back muscles, abdominal musculature are key for long term pain relief. Call me when the [employer] coverage or medicaid goes through and we will go ahead with an MRI.

## 2011-10-15 ENCOUNTER — Encounter: Payer: Self-pay | Admitting: Family Medicine

## 2011-10-15 DIAGNOSIS — M545 Low back pain: Secondary | ICD-10-CM | POA: Insufficient documentation
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
## 2011-10-15 NOTE — Assessment & Plan Note (Signed)
history and exam consistent with left lumbar radiculopathy.  Last radiographs from 1 1/2 years ago show some mild DDD at L3-4 and L4-5.  Along with disc pathology may be causing nerve impingement at either one of these levels.  Given information to apply for medicaid and [employer] coverage - when these go through given his history and everything he has tried would go ahead with MRI - to call us when this occurs.  Start with prednisone dose pack, transition to meloxicam.  Flexeril as needed for spasms, tramadol as needed for pain.  Consider formal PT.  Depending on MRI results would also consider repeat ESIs, neurosurgery referral.

## 2011-11-10 ENCOUNTER — Ambulatory Visit (INDEPENDENT_AMBULATORY_CARE_PROVIDER_SITE_OTHER): Payer: Self-pay | Admitting: Family Medicine

## 2011-11-10 ENCOUNTER — Encounter: Payer: Self-pay | Admitting: Family Medicine

## 2011-11-10 ENCOUNTER — Other Ambulatory Visit: Payer: Self-pay | Admitting: Family Medicine

## 2011-11-10 VITALS — BP 126/81 | HR 106 | Ht 71.0 in | Wt 180.0 lb

## 2011-11-10 DIAGNOSIS — G8929 Other chronic pain: Secondary | ICD-10-CM

## 2011-11-10 DIAGNOSIS — M545 Low back pain: Secondary | ICD-10-CM

## 2011-11-10 DIAGNOSIS — Z1389 Encounter for screening for other disorder: Secondary | ICD-10-CM

## 2011-11-10 NOTE — Assessment & Plan Note (Signed)
history and exam consistent with left lumbar radiculopathy.  Will go ahead with MRI now that he has [employer] coverage.  Will then call him with results and how to proceed - consider formal PT, ESIs, referral to neurosurgery (previously saw Dr. Mikal Plane who performed surgery about 12 years ago.  In meantime continue mobic, tramadol as needed.
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
## 2011-11-13 ENCOUNTER — Ambulatory Visit (HOSPITAL_BASED_OUTPATIENT_CLINIC_OR_DEPARTMENT_OTHER)
Admission: RE | Admit: 2011-11-13 | Discharge: 2011-11-13 | Disposition: A | Discharge status: Home / Self Care | Payer: Self-pay | Source: Ambulatory Visit | Attending: Family Medicine | Admitting: Family Medicine

## 2011-11-13 DIAGNOSIS — M545 Low back pain, unspecified: Secondary | ICD-10-CM | POA: Insufficient documentation

## 2011-11-13 DIAGNOSIS — M79605 Pain in left leg: Secondary | ICD-10-CM

## 2011-11-13 DIAGNOSIS — M5146 Schmorl's nodes, lumbar region: Secondary | ICD-10-CM | POA: Insufficient documentation

## 2011-11-13 DIAGNOSIS — R209 Unspecified disturbances of skin sensation: Secondary | ICD-10-CM | POA: Insufficient documentation

## 2011-11-13 DIAGNOSIS — M5126 Other intervertebral disc displacement, lumbar region: Secondary | ICD-10-CM | POA: Insufficient documentation

## 2011-11-13 DIAGNOSIS — M79609 Pain in unspecified limb: Secondary | ICD-10-CM | POA: Insufficient documentation

## 2011-11-17 ENCOUNTER — Other Ambulatory Visit: Payer: Self-pay | Admitting: Family Medicine

## 2011-11-17 DIAGNOSIS — IMO0002 Reserved for concepts with insufficient information to code with codable children: Secondary | ICD-10-CM

## 2011-11-24 ENCOUNTER — Ambulatory Visit (INDEPENDENT_AMBULATORY_CARE_PROVIDER_SITE_OTHER): Payer: Self-pay | Admitting: Family Medicine

## 2011-11-24 ENCOUNTER — Encounter: Payer: Self-pay | Admitting: Family Medicine

## 2011-11-24 VITALS — BP 121/71 | HR 71 | Ht 71.0 in | Wt 180.0 lb

## 2011-11-24 DIAGNOSIS — M79672 Pain in left foot: Secondary | ICD-10-CM

## 2011-11-24 DIAGNOSIS — M79609 Pain in unspecified limb: Secondary | ICD-10-CM

## 2011-11-25 ENCOUNTER — Encounter: Payer: Self-pay | Admitting: Family Medicine

## 2011-11-25 DIAGNOSIS — M79672 Pain in left foot: Secondary | ICD-10-CM | POA: Insufficient documentation

## 2011-11-25 NOTE — Progress Notes (Signed)
Subjective:    Patient ID: Lonnie [name], male    DOB: [birth year], 46 y.o.   MRN: [id number]  PCP: None  HPI 46 yo M here for bilateral foot pain.  Patient reports several years of plantar foot pain right side, left great toe pain. No known injury to these areas. Started with a pinpoint area plantar ball of right foot that has gotten bigger and large callus forming. Saw a foot doctor previously who pared this down but said the bone was pushing on this too hard and probably wouldn't improve without surgery. Not tried meds, icing, inserts for this. Left great toe (and right also) with large callus formation. Gets tingling into left great toe also but not persistent. Concerned about having diabetes as it runs in his family. He does not have a family doctor.  History reviewed. No pertinent past medical history.  Current Outpatient Prescriptions on File Prior to Visit  Medication Sig Dispense Refill  . meloxicam (MOBIC) 15 MG tablet Take 1 tablet (15 mg total) by mouth daily. With food.  Start AFTER finishing prednisone.  30 tablet  1  . promethazine (PHENERGAN) 25 MG tablet Take 1 tablet (25 mg total) by mouth every 6 (six) hours as needed for nausea.  30 tablet  0  . traMADol (ULTRAM) 50 MG tablet Take 1 tablet (50 mg total) by mouth every 8 (eight) hours as needed for pain.  90 tablet  1    Past Surgical History  Procedure Date  . Back surgery     Allergies  Allergen Reactions  . Ibuprofen     History   Social History  . Marital Status: Single    Spouse Name: N/A    Number of Children: N/A  . Years of Education: N/A   Occupational History  . Not on file.   Social History Main Topics  . Smoking status: Current Every Day Smoker    Types: Cigarettes  . Smokeless tobacco: Not on file   Comment: 8 cigarettes per day  . Alcohol Use: No  . Drug Use: No  . Sexually Active: Not on file   Other Topics Concern  . Not on file   Social History Narrative  . No narrative  on file    Family History  Problem Relation Age of Onset  . Diabetes Mother   . Hypertension Mother   . Hyperlipidemia Father   . Heart attack Neg Hx   . Sudden death Neg Hx     BP 121/71  Pulse 71  Ht 5\' 11"  (1.803 m)  Wt 180 lb (81.647 kg)  BMI 25.10 kg/m2  Review of Systems See HPI above.    Objective:   Physical Exam Gen: NAD  L foot: Large callus medial aspect great toe. Pes planus. Onychomycosis. Also with loss of transverse arch but no significant callus. No focal TTP throughout foot, ankle. FROM ankle and digits. Sensation intact currently.  R foot: Large callus medial aspect great toe. Pes planus. Onychomycosis. Also with loss of transverse arch. Large plantar wart with surrounding hyperkeratosis, callus. TTP in this area but otherwise no foot/ankle TTP. FROM ankle and digits. Sensation intact currently.    Assessment & Plan:  1. Bilateral foot pain - due to a combination of things but primarily due to right plantar wart.  Advised to establish care with a PCP (has orange card - to call numbers on back of this to get one) for treatment for this.  Can try pedegg over the  counter to pare and OTC discs or freezing methods to the wart in meantime.  These are very difficult to eradicate.  Sports insoles with metatarsal pad on right provided to help with pain - felt much better with these.  Icing, meds as he's been taking for his back (mobic, tramadol).  Nothing on exam that would benefit from PT, home exercises. F/u prn for this issue.

## 2011-11-25 NOTE — Assessment & Plan Note (Signed)
due to a combination of things but primarily due to right plantar wart.  Advised to establish care with a PCP (has [facility] card - to call numbers on back of this to get one) for treatment for this.  Can try pedegg over the counter to pare and OTC discs or freezing methods to the wart in meantime.  These are very difficult to eradicate.  Sports insoles with metatarsal pad on right provided to help with pain - felt much better with these.  Icing, meds as he's been taking for his back (mobic, tramadol).  Nothing on exam that would benefit from PT, home exercises. F/u prn for this issue.

## 2011-11-26 ENCOUNTER — Other Ambulatory Visit: Payer: Self-pay

## 2011-12-01 ENCOUNTER — Ambulatory Visit
Admission: RE | Admit: 2011-12-01 | Discharge: 2011-12-01 | Disposition: A | Discharge status: Home / Self Care | Payer: No Typology Code available for payment source | Source: Ambulatory Visit | Attending: Family Medicine | Admitting: Family Medicine

## 2011-12-01 ENCOUNTER — Other Ambulatory Visit: Payer: Self-pay | Admitting: Family Medicine

## 2011-12-01 DIAGNOSIS — IMO0002 Reserved for concepts with insufficient information to code with codable children: Secondary | ICD-10-CM

## 2011-12-01 MED ORDER — METHYLPREDNISOLONE ACETATE 40 MG/ML INJ SUSP (RADIOLOG
120.0000 mg | Freq: Once | INTRAMUSCULAR | Status: AC
  Administered 2011-12-01: 120 mg via EPIDURAL

## 2011-12-01 MED ORDER — IOHEXOL 180 MG/ML  SOLN
1.0000 mL | Freq: Once | INTRAMUSCULAR | Status: AC | PRN
  Administered 2011-12-01: 1 mL via EPIDURAL

## 2012-04-19 ENCOUNTER — Emergency Department (HOSPITAL_BASED_OUTPATIENT_CLINIC_OR_DEPARTMENT_OTHER): Payer: No Typology Code available for payment source

## 2012-04-19 ENCOUNTER — Encounter (HOSPITAL_BASED_OUTPATIENT_CLINIC_OR_DEPARTMENT_OTHER): Payer: Self-pay

## 2012-04-19 ENCOUNTER — Emergency Department (HOSPITAL_BASED_OUTPATIENT_CLINIC_OR_DEPARTMENT_OTHER)
Admission: EM | Admit: 2012-04-19 | Discharge: 2012-04-19 | Disposition: A | Discharge status: Home / Self Care | Payer: No Typology Code available for payment source | Attending: Emergency Medicine | Admitting: Emergency Medicine

## 2012-04-19 DIAGNOSIS — R1110 Vomiting, unspecified: Secondary | ICD-10-CM | POA: Insufficient documentation

## 2012-04-19 DIAGNOSIS — R1013 Epigastric pain: Secondary | ICD-10-CM | POA: Insufficient documentation

## 2012-04-19 DIAGNOSIS — F17200 Nicotine dependence, unspecified, uncomplicated: Secondary | ICD-10-CM | POA: Insufficient documentation

## 2012-04-19 LAB — CBC WITH DIFFERENTIAL/PLATELET
Basophils Absolute: 0 10*3/uL (ref 0.0–0.1)
Eosinophils Absolute: 0.1 10*3/uL (ref 0.0–0.7)
Eosinophils Relative: 2 % (ref 0–5)
Lymphs Abs: 2.7 10*3/uL (ref 0.7–4.0)
MCH: 28.8 pg (ref 26.0–34.0)
MCV: 83.1 fL (ref 78.0–100.0)
Platelets: 311 10*3/uL (ref 150–400)
RDW: 13.9 % (ref 11.5–15.5)

## 2012-04-19 LAB — COMPREHENSIVE METABOLIC PANEL
ALT: 21 U/L (ref 0–53)
Calcium: 9.2 mg/dL (ref 8.4–10.5)
Creatinine, Ser: 1.3 mg/dL (ref 0.50–1.35)
GFR calc Af Amer: 75 mL/min — ABNORMAL LOW (ref >90)
Glucose, Bld: 114 mg/dL — ABNORMAL HIGH (ref 70–99)
Sodium: 137 mEq/L (ref 135–145)
Total Protein: 7 g/dL (ref 6.0–8.3)

## 2012-04-19 MED ORDER — ONDANSETRON 4 MG PO TBDP: 4.0000 mg | ORAL_TABLET | Freq: Three times a day (TID) | ORAL | Status: DC | PRN

## 2012-04-19 MED ORDER — ONDANSETRON 8 MG PO TBDP
8.0000 mg | ORAL_TABLET | Freq: Once | ORAL | Status: AC
  Administered 2012-04-19: 8 mg via ORAL
  Filled 2012-04-19: qty 1

## 2012-04-19 MED ORDER — GI COCKTAIL ~~LOC~~
30.0000 mL | Freq: Once | ORAL | Status: AC
  Administered 2012-04-19: 30 mL via ORAL
  Filled 2012-04-19: qty 30

## 2012-04-19 NOTE — ED Notes (Signed)
Pt states that he has been vomiting for the past three days.  Pt states that he has vomited about 8-9 times in the past 12 hours.  Pt c/o severe epigastric pain and then generalized abdominal cramping.

## 2012-04-19 NOTE — ED Notes (Signed)
Pt asleep upon entering room to given antiemetic, pt woke up and medication given

## 2012-04-19 NOTE — ED Provider Notes (Signed)
Medical screening examination/treatment/procedure(s) were performed by non-physician practitioner and as supervising physician I was immediately available for consultation/collaboration.   Zakyra Kukuk, MD 04/19/12 2222 

## 2012-04-19 NOTE — ED Provider Notes (Signed)
History     CSN: [id number]  Arrival date & time 04/19/12  2009   First MD Initiated Contact with Patient 04/19/12 2035      Chief Complaint  Patient presents with  . Emesis    (Consider location/radiation/quality/duration/timing/severity/associated sxs/prior treatment) HPI Comments: Pt states that he has been vomiting for the last 3 days and has had epigastic pain:pt states that he had abdominal cramping this morning and had a bm this morning:pt denies anything making the symptoms better or worse  Patient is a 47 y.o. male presenting with vomiting. The history is provided by the patient. No language interpreter was used.  Emesis Severity:  Moderate Duration:  3 days Timing:  Constant Quality:  Unable to specify Able to tolerate:  Liquids Progression:  Unchanged Chronicity:  New Relieved by:  Nothing Worsened by:  Nothing tried Associated symptoms: no cough, no diarrhea, no fever, no headaches, no sore throat and no URI   Associated symptoms comment:  Epigastric pain Risk factors: no alcohol use and no travel to endemic areas     History reviewed. No pertinent past medical history.  Past Surgical History  Procedure Laterality Date  . Back surgery      Family History  Problem Relation Age of Onset  . Diabetes Mother   . Hypertension Mother   . Hyperlipidemia Father   . Heart attack Neg Hx   . Sudden death Neg Hx     History  Substance Use Topics  . Smoking status: Current Every Day Smoker -- 0.50 packs/day for 15 years    Types: Cigarettes  . Smokeless tobacco: Never Used  . Alcohol Use: Yes      Review of Systems  Constitutional: Negative.   HENT: Negative for sore throat.   Respiratory: Negative.   Cardiovascular: Negative.   Gastrointestinal: Positive for vomiting. Negative for diarrhea.  Neurological: Negative for headaches.    Allergies  Ibuprofen  Home Medications   Current Outpatient Rx  Name  Route  Sig  Dispense  Refill  . meloxicam  (MOBIC) 15 MG tablet   Oral   Take 1 tablet (15 mg total) by mouth daily. With food.  Start AFTER finishing prednisone.   30 tablet   1   . EXPIRED: promethazine (PHENERGAN) 25 MG tablet   Oral   Take 1 tablet (25 mg total) by mouth every 6 (six) hours as needed for nausea.   30 tablet   0   . traMADol (ULTRAM) 50 MG tablet   Oral   Take 1 tablet (50 mg total) by mouth every 8 (eight) hours as needed for pain.   90 tablet   1     BP 121/76  Pulse 80  Temp(Src) 98.2 F (36.8 C) (Oral)  Resp 16  Ht 5\' 9"  (1.753 m)  Wt 180 lb (81.647 kg)  BMI 26.57 kg/m2  SpO2 100%  Physical Exam  Nursing note and vitals reviewed. Constitutional: He is oriented to person, place, and time. He appears well-developed and well-nourished.  HENT:  Head: Normocephalic.  Eyes: Conjunctivae and EOM are normal.  Neck: Neck supple.  Cardiovascular: Normal rate.   Pulmonary/Chest: Effort normal and breath sounds normal.  Abdominal: Soft. Bowel sounds are normal. There is no tenderness.  Musculoskeletal: Normal range of motion.  Neurological: He is alert and oriented to person, place, and time.  Skin: Skin is warm and dry.  Psychiatric: He has a normal mood and affect.    ED Course  Procedures (including  critical care time)  Labs Reviewed  COMPREHENSIVE METABOLIC PANEL - Abnormal; Notable for the following:    Glucose, Bld 114 (*)    GFR calc non Af Amer 64 (*)    GFR calc Af Amer 75 (*)    All other components within normal limits  CBC WITH DIFFERENTIAL  TROPONIN I   No results found.  Date: 04/19/2012  Rate: 81  Rhythm: normal sinus rhythm  QRS Axis: normal  Intervals: normal  ST/T Wave abnormalities: early repolarization  Conduction Disutrbances:none  Narrative Interpretation:   Old EKG Reviewed: unchanged    1. Epigastric pain   2. Vomiting       MDM  Pt is feeling better:pt is tolerating po at this time:doubt cardiac in nature        Teressa Lower,  NP 04/19/12 2206

## 2012-05-03 ENCOUNTER — Ambulatory Visit: Payer: No Typology Code available for payment source

## 2012-05-10 ENCOUNTER — Ambulatory Visit: Payer: Self-pay

## 2012-05-24 ENCOUNTER — Ambulatory Visit: Payer: Self-pay

## 2012-11-08 ENCOUNTER — Emergency Department (HOSPITAL_BASED_OUTPATIENT_CLINIC_OR_DEPARTMENT_OTHER)
Admission: EM | Admit: 2012-11-08 | Discharge: 2012-11-09 | Disposition: A | Discharge status: Home / Self Care | Payer: Self-pay | Attending: Emergency Medicine | Admitting: Emergency Medicine

## 2012-11-08 ENCOUNTER — Encounter (HOSPITAL_BASED_OUTPATIENT_CLINIC_OR_DEPARTMENT_OTHER): Payer: Self-pay | Admitting: Emergency Medicine

## 2012-11-08 DIAGNOSIS — R221 Localized swelling, mass and lump, neck: Secondary | ICD-10-CM

## 2012-11-08 DIAGNOSIS — E119 Type 2 diabetes mellitus without complications: Secondary | ICD-10-CM | POA: Insufficient documentation

## 2012-11-08 DIAGNOSIS — K4090 Unilateral inguinal hernia, without obstruction or gangrene, not specified as recurrent: Secondary | ICD-10-CM | POA: Insufficient documentation

## 2012-11-08 DIAGNOSIS — Z87891 Personal history of nicotine dependence: Secondary | ICD-10-CM | POA: Insufficient documentation

## 2012-11-08 DIAGNOSIS — R220 Localized swelling, mass and lump, head: Secondary | ICD-10-CM | POA: Insufficient documentation

## 2012-11-08 DIAGNOSIS — Z79899 Other long term (current) drug therapy: Secondary | ICD-10-CM | POA: Insufficient documentation

## 2012-11-08 NOTE — ED Notes (Signed)
Pt presents with painful knot in lower abdomen and on upper back below neck. Pain worsened after lifting heavy boxes at work but pt sts he has been dealing with it for "awhile."  Pain 8/10. Pt denies fever, chills.A&Ox4, NAD noted.

## 2012-11-08 NOTE — ED Notes (Signed)
C/o lower abd pain since this AM, pt states that the pain started after lifting heavy boxes at work. Pt thinks he may have a hernia.

## 2012-11-08 NOTE — ED Provider Notes (Signed)
CSN: [id number]     Arrival date & time 11/08/12  2233 History  This chart was scribed for Hurman Horn, MD by Karle Plumber, ED Scribe. This patient was seen in room MH09/MH09 and the patient's care was started at 11:40 PM.  Chief Complaint  Patient presents with  . Abdominal Pain   The history is provided by the patient. No language interpreter was used.   HPI Comments:  TEDFORD BERG is a 47 y.o. male with h/o DM type II controlled by diet who presents to the Emergency Department complaining of a right groin bulge gradual onset 6 months. Pt states that he experiences a constant burning pain that worsens with activity and movement. He reports pushing the bulge back in when it gets too large when heavy lifting at work. He denies fever, chest pain, SOB, abdominal pain, vomiting, dysuria, or hematuria. He reports no testicular pain but burning and pain in the general right inguinal canal area. Hernia reduced by Pt upon arrival to ED.    Past Medical History  Diagnosis Date  . Diabetes mellitus without complication    Past Surgical History  Procedure Laterality Date  . Back surgery     Family History  Problem Relation Age of Onset  . Diabetes Mother   . Hypertension Mother   . Hyperlipidemia Father   . Heart attack Neg Hx   . Sudden death Neg Hx    History  Substance Use Topics  . Smoking status: Former Smoker -- 0.50 packs/day for 15 years    Types: Cigarettes    Quit date: 08/08/2012  . Smokeless tobacco: Never Used  . Alcohol Use: Yes    Review of Systems 10 Systems reviewed and all are negative for acute change except as noted in the HPI.  Allergies  Ibuprofen  Home Medications   Current Outpatient Rx  Name  Route  Sig  Dispense  Refill  . meloxicam (MOBIC) 15 MG tablet   Oral   Take 1 tablet (15 mg total) by mouth daily. With food.  Start AFTER finishing prednisone.   30 tablet   1   . ondansetron (ZOFRAN ODT) 4 MG disintegrating tablet   Oral   Take 1  tablet (4 mg total) by mouth every 8 (eight) hours as needed for nausea.   20 tablet   0   . EXPIRED: promethazine (PHENERGAN) 25 MG tablet   Oral   Take 1 tablet (25 mg total) by mouth every 6 (six) hours as needed for nausea.   30 tablet   0   . traMADol (ULTRAM) 50 MG tablet   Oral   Take 1 tablet (50 mg total) by mouth every 8 (eight) hours as needed for pain.   90 tablet   1    Triage Vitals: BP 122/74  Pulse 74  Temp(Src) 98.5 F (36.9 C) (Oral)  Resp 18  Ht 5\' 11"  (1.803 m)  Wt 190 lb (86.183 kg)  BMI 26.51 kg/m2  SpO2 100% Physical Exam  Nursing note and vitals reviewed. Constitutional:  Awake, alert, nontoxic appearance.  HENT:  Head: Atraumatic.  Eyes: Right eye exhibits no discharge. Left eye exhibits no discharge.  Neck: Neck supple.  Cardiovascular: Normal rate and regular rhythm.   No murmur heard. Pulmonary/Chest: Effort normal and breath sounds normal. No respiratory distress. He has no wheezes. He has no rales. He exhibits no tenderness.  Abdominal: Soft. Bowel sounds are normal. He exhibits no distension and no mass. There  is no tenderness. There is no rebound and no guarding.  Genitourinary:  Testicles non-tender. No palpable hernias but tender right inguinal canal suspicious for recent self-reduction by patient  Musculoskeletal: He exhibits no tenderness.  Right posterior neck has soft rounded subcutaneous nodule consistent with cyst vs lipoma 1 cm diameter. No overlying erythema or fluctuance to suggest infection.  Neurological: He is alert.  Mental status and motor strength appears baseline for patient and situation.  Skin: No rash noted.  Psychiatric: He has a normal mood and affect.    ED Course  Procedures (including critical care time) DIAGNOSTIC STUDIES: Oxygen Saturation is 100% on RA, normal by my interpretation.   Patient / Family / Caregiver understand and agree with initial ED impression and plan with expectations set for ED  visit.Patient / Family / Caregiver informed of clinical course, understand medical decision-making process, and agree with plan.  Labs Review Labs Reviewed - No data to display Imaging Review No results found.  EKG Interpretation   None       MDM   1. Right inguinal hernia   2. Neck mass    I doubt any other EMC precluding discharge at this time including, but not necessarily limited to the following:incarcerated or strangulated hernia.  I personally performed the services described in this documentation, which was scribed in my presence. The recorded information has been reviewed and is accurate.     Hurman Horn, MD 11/09/12 [phone]

## 2012-11-14 ENCOUNTER — Emergency Department (HOSPITAL_COMMUNITY)
Admission: EM | Admit: 2012-11-14 | Discharge: 2012-11-14 | Disposition: A | Discharge status: Home / Self Care | Payer: Self-pay | Attending: Emergency Medicine | Admitting: Emergency Medicine

## 2012-11-14 ENCOUNTER — Encounter (HOSPITAL_COMMUNITY): Payer: Self-pay | Admitting: Emergency Medicine

## 2012-11-14 DIAGNOSIS — K4090 Unilateral inguinal hernia, without obstruction or gangrene, not specified as recurrent: Secondary | ICD-10-CM | POA: Insufficient documentation

## 2012-11-14 DIAGNOSIS — Z87891 Personal history of nicotine dependence: Secondary | ICD-10-CM | POA: Insufficient documentation

## 2012-11-14 DIAGNOSIS — E119 Type 2 diabetes mellitus without complications: Secondary | ICD-10-CM | POA: Insufficient documentation

## 2012-11-14 LAB — CBC WITH DIFFERENTIAL/PLATELET
Eosinophils Absolute: 0.2 10*3/uL (ref 0.0–0.7)
Hemoglobin: 13.7 g/dL (ref 13.0–17.0)
Lymphocytes Relative: 45 % (ref 12–46)
Lymphs Abs: 3.1 10*3/uL (ref 0.7–4.0)
MCH: 28.3 pg (ref 26.0–34.0)
Monocytes Relative: 9 % (ref 3–12)
Neutro Abs: 2.9 10*3/uL (ref 1.7–7.7)
Neutrophils Relative %: 43 % (ref 43–77)
RBC: 4.84 MIL/uL (ref 4.22–5.81)
WBC: 6.8 10*3/uL (ref 4.0–10.5)

## 2012-11-14 LAB — BASIC METABOLIC PANEL
CO2: 26 mEq/L (ref 19–32)
Chloride: 99 mEq/L (ref 96–112)
Glucose, Bld: 81 mg/dL (ref 70–99)
Potassium: 3.8 mEq/L (ref 3.5–5.1)
Sodium: 135 mEq/L (ref 135–145)

## 2012-11-14 LAB — LACTIC ACID, PLASMA: Lactic Acid, Venous: 0.8 mmol/L (ref 0.5–2.2)

## 2012-11-14 MED ORDER — TRAMADOL HCL 50 MG PO TABS: 50.0000 mg | ORAL_TABLET | Freq: Four times a day (QID) | ORAL | Status: DC | PRN

## 2012-11-14 MED ORDER — FENTANYL CITRATE 0.05 MG/ML IJ SOLN
75.0000 ug | Freq: Once | INTRAMUSCULAR | Status: AC
  Administered 2012-11-14: 75 ug via INTRAVENOUS
  Filled 2012-11-14: qty 2

## 2012-11-14 NOTE — ED Notes (Addendum)
Pt reporting hx right inguinal today was picking up a bucket and developed a lot of pain there. Reports burning into back, pain with urination. Vomited x 1 due to pain. States that he pushed it back in after it happened.

## 2012-11-14 NOTE — ED Provider Notes (Signed)
CSN: [id number]     Arrival date & time 11/14/12  7253 History   First MD Initiated Contact with Patient 11/14/12 0840     Chief Complaint  Patient presents with  . Hernia   (Consider location/radiation/quality/duration/timing/severity/associated sxs/prior Treatment) HPI Comments: 47 yo male with smoking and DM diet controlled hx presents with right groin swelling and pain since 1 hr PTA.  Pt has hx of known inguinal hernia, with prolonged standing/ lifting it will bulge then come back with pressure or rest.  No surgery hx.  Today lifted 75lb object and felt bulge but also pain.  He was able to push it back before arrival in ED.  Pain improved.  No fevers. No stone hx.  NO testicle pain.   The history is provided by the patient.    Past Medical History  Diagnosis Date  . Diabetes mellitus without complication    Past Surgical History  Procedure Laterality Date  . Back surgery     Family History  Problem Relation Age of Onset  . Diabetes Mother   . Hypertension Mother   . Hyperlipidemia Father   . Heart attack Neg Hx   . Sudden death Neg Hx    History  Substance Use Topics  . Smoking status: Former Smoker -- 0.50 packs/day for 15 years    Types: Cigarettes    Quit date: 08/08/2012  . Smokeless tobacco: Never Used  . Alcohol Use: Yes    Review of Systems  Constitutional: Negative for fever and chills.  Eyes: Negative for visual disturbance.  Respiratory: Negative for shortness of breath.   Cardiovascular: Negative for chest pain.  Gastrointestinal: Negative for nausea, vomiting and abdominal pain (groin pain right).  Genitourinary: Negative for dysuria and flank pain.  Musculoskeletal: Negative for back pain, neck pain and neck stiffness.  Skin: Negative for rash.  Neurological: Negative for light-headedness and headaches.    Allergies  Ibuprofen  Home Medications   Current Outpatient Rx  Name  Route  Sig  Dispense  Refill  . EXPIRED: promethazine (PHENERGAN)  25 MG tablet   Oral   Take 1 tablet (25 mg total) by mouth every 6 (six) hours as needed for nausea.   30 tablet   0    BP 118/60  Pulse 71  Temp(Src) 97.9 F (36.6 C) (Oral)  Resp 20  Ht 5\' 11"  (1.803 m)  Wt 200 lb (90.719 kg)  BMI 27.91 kg/m2  SpO2 99% Physical Exam  Nursing note and vitals reviewed. Constitutional: He is oriented to person, place, and time. He appears well-developed and well-nourished.  HENT:  Head: Normocephalic and atraumatic.  Eyes: Conjunctivae are normal. Right eye exhibits no discharge. Left eye exhibits no discharge.  Neck: Normal range of motion. Neck supple. No tracheal deviation present.  Cardiovascular: Normal rate and regular rhythm.   Pulmonary/Chest: Effort normal and breath sounds normal.  Abdominal: Soft. He exhibits no distension. There is tenderness (mild tender right groin, no bulge, no hernia on digital exam, normal testicle position, no swelling/ erythema). There is no guarding.  Musculoskeletal: He exhibits no edema.  Neurological: He is alert and oriented to person, place, and time.  Skin: Skin is warm. No rash noted.  Psychiatric: He has a normal mood and affect.    ED Course  Procedures (including critical care time) Labs Review Labs Reviewed  BASIC METABOLIC PANEL - Abnormal; Notable for the following:    GFR calc non Af Amer 72 (*)    GFR calc  Af Amer 83 (*)    All other components within normal limits  CBC WITH DIFFERENTIAL  LACTIC ACID, PLASMA  URINALYSIS, ROUTINE W REFLEX MICROSCOPIC   Imaging Review No results found.  EKG Interpretation   None       MDM  No diagnosis found. Right inguinal hernia clinically that has self reduced.  With mild pain in ED plan for pain meds and labs.  Discussed reasons to see physician emergently and close fup with surgery to discuss Elective repair.    Smoking cessation and outpatient follow up was discussed with the patient for 3 to 5  Minutes.  Pain resolved in ED.  Labs  unremarkable.  Results and differential diagnosis were discussed with the patient. Close follow up outpatient was discussed, patient comfortable with the plan.   Diagnosis: Right inguinal hernia, groin pain    Enid Skeens, MD 11/14/12 [phone]

## 2012-11-14 NOTE — ED Notes (Signed)
No hernia noted. States right inguinal pain with burning that radiates to his back. Right inguinal area painful to touch.

## 2012-11-17 ENCOUNTER — Encounter (HOSPITAL_BASED_OUTPATIENT_CLINIC_OR_DEPARTMENT_OTHER): Payer: Self-pay | Admitting: Emergency Medicine

## 2012-11-17 ENCOUNTER — Emergency Department (HOSPITAL_BASED_OUTPATIENT_CLINIC_OR_DEPARTMENT_OTHER)
Admission: EM | Admit: 2012-11-17 | Discharge: 2012-11-17 | Disposition: A | Discharge status: Home / Self Care | Payer: Self-pay | Attending: Emergency Medicine | Admitting: Emergency Medicine

## 2012-11-17 ENCOUNTER — Emergency Department (HOSPITAL_BASED_OUTPATIENT_CLINIC_OR_DEPARTMENT_OTHER): Payer: Self-pay

## 2012-11-17 DIAGNOSIS — E119 Type 2 diabetes mellitus without complications: Secondary | ICD-10-CM | POA: Insufficient documentation

## 2012-11-17 DIAGNOSIS — F17200 Nicotine dependence, unspecified, uncomplicated: Secondary | ICD-10-CM | POA: Insufficient documentation

## 2012-11-17 DIAGNOSIS — N419 Inflammatory disease of prostate, unspecified: Secondary | ICD-10-CM | POA: Insufficient documentation

## 2012-11-17 LAB — URINALYSIS, ROUTINE W REFLEX MICROSCOPIC
Leukocytes, UA: NEGATIVE
Nitrite: NEGATIVE
Specific Gravity, Urine: 1.026 (ref 1.005–1.030)
Urobilinogen, UA: 1 mg/dL (ref 0.0–1.0)
pH: 6 (ref 5.0–8.0)

## 2012-11-17 LAB — GC/CHLAMYDIA PROBE AMP
CT Probe RNA: NEGATIVE
GC Probe RNA: NEGATIVE

## 2012-11-17 MED ORDER — METRONIDAZOLE 500 MG PO TABS
2000.0000 mg | ORAL_TABLET | Freq: Once | ORAL | Status: AC
  Administered 2012-11-17: 2000 mg via ORAL
  Filled 2012-11-17: qty 4

## 2012-11-17 MED ORDER — CIPROFLOXACIN HCL 500 MG PO TABS: 500.0000 mg | ORAL_TABLET | Freq: Two times a day (BID) | ORAL | Status: DC

## 2012-11-17 MED ORDER — CEFTRIAXONE SODIUM 250 MG IJ SOLR
250.0000 mg | Freq: Once | INTRAMUSCULAR | Status: AC
  Administered 2012-11-17: 250 mg via INTRAMUSCULAR
  Filled 2012-11-17: qty 250

## 2012-11-17 MED ORDER — LIDOCAINE HCL (PF) 1 % IJ SOLN
INTRAMUSCULAR | Status: AC
  Administered 2012-11-17: 5 mL
  Filled 2012-11-17: qty 5

## 2012-11-17 MED ORDER — CIPROFLOXACIN HCL 500 MG PO TABS
500.0000 mg | ORAL_TABLET | Freq: Once | ORAL | Status: AC
  Administered 2012-11-17: 500 mg via ORAL
  Filled 2012-11-17: qty 1

## 2012-11-17 NOTE — ED Provider Notes (Addendum)
CSN: [id number]     Arrival date & time 11/17/12  0036 History   First MD Initiated Contact with Patient 11/17/12 0116     Chief Complaint  Patient presents with  . Penile Discharge   (Consider location/radiation/quality/duration/timing/severity/associated sxs/prior Treatment) HPI  this is a 47 year old male who states he is monogamous. He is here complaining of burning with urination for the past 2 weeks. He states the burning is deep not at the end of his penis. Contrary to what nursing staff documentated he denies penile discharge to me. He is also having some pain in his left lower posterior rib cage. He has not had fever or chills.  Past Medical History  Diagnosis Date  . Diabetes mellitus without complication     diet controlled   Past Surgical History  Procedure Laterality Date  . Back surgery     Family History  Problem Relation Age of Onset  . Diabetes Mother   . Hypertension Mother   . Hyperlipidemia Father   . Heart attack Neg Hx   . Sudden death Neg Hx    History  Substance Use Topics  . Smoking status: Current Some Day Smoker -- 15 years    Types: Cigarettes    Last Attempt to Quit: 08/08/2012  . Smokeless tobacco: Never Used  . Alcohol Use: Yes    Review of Systems  All other systems reviewed and are negative.    Allergies  Ibuprofen  Home Medications   Current Outpatient Rx  Name  Route  Sig  Dispense  Refill  . EXPIRED: promethazine (PHENERGAN) 25 MG tablet   Oral   Take 1 tablet (25 mg total) by mouth every 6 (six) hours as needed for nausea.   30 tablet   0   . traMADol (ULTRAM) 50 MG tablet   Oral   Take 1 tablet (50 mg total) by mouth every 6 (six) hours as needed for pain.   8 tablet   0    BP 123/76  Pulse 71  Temp(Src) 97.7 F (36.5 C) (Oral)  Resp 16  Ht 5\' 11"  (1.803 m)  Wt 200 lb (90.719 kg)  BMI 27.91 kg/m2  SpO2 100%  Physical Exam General: Well-developed, well-nourished male in no acute distress; appearance  consistent with age of record HENT: normocephalic; atraumatic Eyes: pupils equal, round and reactive to light; extraocular muscles intact Neck: supple Heart: regular rate and rhythm Lungs: clear to auscultation bilaterally Chest: Left lower posterior rib tenderness without deformity or crepitus Abdomen: soft; nondistended; nontender; no masses or hepatosplenomegaly; bowel sounds present GU: No CVA tenderness; Tanner 4 male, circumcised; no urethral discharge Rectal: Normal sphincter tone; prostate tender Extremities: No deformity; full range of motion Neurologic: Awake, alert and oriented; motor function intact in all extremities and symmetric; no facial droop Skin: Warm and dry Psychiatric: Normal mood and affect    ED Course  Procedures (including critical care time)  MDM   Nursing notes and vitals signs, including pulse oximetry, reviewed.  Summary of this visit's results, reviewed by myself:  Labs:  Results for orders placed during the hospital encounter of 11/17/12 (from the past 24 hour(s))  URINALYSIS, ROUTINE W REFLEX MICROSCOPIC     Status: None   Collection Time    11/17/12  1:34 AM      Result Value Range   Color, Urine YELLOW  YELLOW   APPearance CLEAR  CLEAR   Specific Gravity, Urine 1.026  1.005 - 1.030   pH 6.0  5.0 - 8.0   Glucose, UA NEGATIVE  NEGATIVE mg/dL   Hgb urine dipstick NEGATIVE  NEGATIVE   Bilirubin Urine NEGATIVE  NEGATIVE   Ketones, ur NEGATIVE  NEGATIVE mg/dL   Protein, ur NEGATIVE  NEGATIVE mg/dL   Urobilinogen, UA 1.0  0.0 - 1.0 mg/dL   Nitrite NEGATIVE  NEGATIVE   Leukocytes, UA NEGATIVE  NEGATIVE    Imaging Studies: Dg Ribs Unilateral W/chest Left  11/17/2012   CLINICAL DATA:  Left lower posterior rib pain for 2 weeks. No known injury.  EXAM: LEFT RIBS AND CHEST - 3+ VIEW  COMPARISON:  Chest 04/19/2012  FINDINGS: No fracture or other bone lesions are seen involving the ribs. There is no evidence of pneumothorax or pleural effusion.  Both lungs are clear. Heart size and mediastinal contours are within normal limits.  IMPRESSION: Negative.   Electronically Signed   By: Burman Nieves M.D.   On: 11/17/2012 01:51   We will treat for prostatitis.   2:27 AM Patient's sexual partner as advised him that she was recently tested for and treated for trichomoniasis. We will add Flagyl to his treatment regimen.   Hanley Seamen, MD 11/17/12 0272  Hanley Seamen, MD 11/17/12 [phone]

## 2012-11-17 NOTE — ED Notes (Addendum)
Rx x 1 given for cipro. Pt given resource guide

## 2012-11-17 NOTE — ED Notes (Signed)
Pt on phone with recent sexual contact who informed him she tested positive for trich after their last sexual encounter. EDP [place] made aware

## 2012-11-17 NOTE — Discharge Instructions (Signed)
Prostatitis The prostate gland is about the size and shape of a walnut. It is located just below your bladder. It produces one of the components of semen, which is made up of sperm and the fluids that help nourish and transport it out from the testicles. Prostatitis is redness, soreness, and swelling (inflammation) of the prostate gland.  There are 3 types of prostatitis:  Acute bacterial prostatitis This is the least common type of prostatitis. It starts quickly and usually leads to a bladder infection. It can occur at any age.  Chronic bacterial prostatitis This is a persistent bacterial infection in the prostate. It usually develops from repeated acute bacterial prostatitis or acute bacterial prostatitis that was not properly treated. It can occur in men of any age but is most common in middle-aged men whose prostate has begun to enlarge.   Chronic prostatitis chronic pelvic pain syndrome This is the most common type of prostatitis. It is inflammation of the prostate gland that is not caused by a bacterial infection. The cause is unknown. CAUSES The cause of acute and chronic bacterial prostatitis is a bacterial infection. The exact cause of chronic prostatitis and chronic pelvic pain syndrome and asymptomatic inflammatory prostatitis is unknown.  SYMPTOMS  Symptoms can vary depending upon the type of prostatitis that exists. There can also be overlap in symptoms. Possible symptoms for each type of prostatitis are listed below. Acute bacterial prostatitis  Painful urination.  Fever or chills.  Muscle or joint pains.  Low back pain.  Low abdominal pain.  Inability to empty bladder completely.  Sudden urge to urinate.  Frequent urination.  Difficulty starting urine stream.  Weak urine stream.  Discharge from the urethra.  Dribbling after urination.  Rectal pain.  Pain in the testicles, penis, or tip of the penis.  Pain in the space between the anus and scrotum  (perineum).  Problems with sexual function.  Painful ejaculation.  Bloody semen. Chronic bacterial prostatitis  The symptoms are similar to those of acute bacterial prostatitis, but they usually are much less severe. Fever, chills, and muscle and joint pain are not associated with chronic bacterial prostatitis. Chronic prostatitis chronic pelvic pain syndrome  Symptoms typically include a dull ache in the scrotum and the perineum. DIAGNOSIS  In order to diagnose prostatitis, your caregiver will ask about your symptoms. If acute or chronic bacterial prostatitis is suspected, a urine sample will be taken and tested (urinalysis). This is to see if there is bacteria in your urine. If the urinalysis result is negative for bacteria, your caregiver may use a finger to feel your prostate (digital rectal exam). This exam helps your caregiver determine if your prostate is swollen and tender. TREATMENT  Treatment for prostatitis depends on the cause. If a bacterial infection is the cause, it can be treated with antibiotic medicine. In cases of chronic bacterial prostatitis, the use of antibiotics for up to 1 month may be necessary. Your caregiver may instruct you to take sitz baths to help relieve pain. A sitz bath is a bath of hot water in which your hips and buttocks are under water. HOME CARE INSTRUCTIONS   Take all medicines as directed by your caregiver.  Take sitz baths as directed by your caregiver. SEEK MEDICAL CARE IF:   Your symptoms get worse, not better.  You have a fever. SEEK IMMEDIATE MEDICAL CARE IF:   You have chills.  You feel nauseous or vomit.  You feel lightheaded or faint.  You are unable to  urinate.  You have blood or blood clots in your urine. Document Released: 01/09/2000 Document Revised: 04/05/2011 Document Reviewed: 12/14/2010 [facility] Patient Information 2014 [place], [place].        RESOURCE GUIDE  Chronic Pain Problems: Contact [facility]  [phone] Patients need to be referred by their primary care doctor.  Insufficient Money for Medicine: Contact United Way:  call [phone]  No Primary Care Doctor: - Call Health Connect  [phone] - can help you locate a primary care doctor that  accepts your insurance, provides certain services, etc. - Physician Referral Service- [phone]  Agencies that provide inexpensive medical care: - [facility]  [phone] - [facility]  [phone] - [facility]  [phone] - Women's Clinic  [phone] - Planned Parenthood  [phone] - [facility]  [phone]  Medicaid-accepting [facility] Providers: - [facility]- [address]  [phone], Mon-Fri 9am-7pm, Sat 9am-1pm - [facility]- [address] [place], Suite [place]  [phone] - [facility]- [address] [place]  [phone] [facility]- [address]  [phone] - Renaye Rakers- [address] [place], [address], [phone]  Only accepts [place] patients after they have their name  applied to their card  Self Pay (no insurance) in [facility]: - Sickle Cell Patients: [facility]  [address] [place], [phone] - [facility]- [address] [place]  [phone]       [facility] [place]- [address]       -     [facility]- see information above (Speak to [employer] if you do not have insurance)       -  [facility]- [address],  [phone]       -  [facility]- [address], [phone]       -  Dr Julio Sicks-  [address], [place], [phone]       -  [facility] - [address] [phone]       -  [facility] [address], [phone], also [address] [phone]       -    [facility]-  [address] [place], [phone], 1st & 3rd Saturday        every month, 10am-1pm  -    [facility]   [address], [place]   Phone:  [phone], Fax:  [phone].  Hours of Operation:  9 am - 6 pm, M-F.  -    [facility]   [address], [place]   Phone: [phone], Fax: [phone]. Hours of Operation:  8:30 am - 5:30 pm, M-F.  [facility] [address] [place], [place] [address] [phone]  [facility] [address] [place], [place] [address] [phone]  1) Find a Doctor and Pay Out of Pocket Although you won't have to find out who is covered by your insurance plan, it is a good idea to ask around and get recommendations. You will then need to call the office and see if the doctor you have chosen will accept you as a new patient and  what types of options they offer for patients who are self-pay. Some doctors offer discounts or will set up payment plans for their patients who do not have insurance, but you will need to ask so you aren't surprised when you get to your appointment.  2) Contact Your Local Health Department Not all health departments have doctors that can see patients for sick visits, but many do, so it is worth a call to see if yours does. If you don't know where your local health department is, you can check in your phone book. The CDC also has a tool to help you locate your state's health department, and many state websites also have listings of all of their local health departments.  3) Find a Walk-in Clinic If your illness is not likely to be very severe or complicated, you may want to try a walk in clinic. These are popping up all over the country in pharmacies, drugstores, and shopping centers. They're usually staffed by nurse practitioners or physician assistants that have been trained to treat common illnesses and complaints. They're usually fairly quick and  inexpensive. However, if you have serious medical issues or chronic medical problems, these are probably not your best option  STD Testing - [facility] Department of [facility], STD Clinic, [address] [place], phone [phone] or [phone].  Monday - Friday, call for an appointment. [facility] of [employer], STD Clinic, [place] [address], [place], phone [phone] or [phone].  Monday - Friday, call for an appointment.  Abuse/Neglect: [facility] Child Abuse Hotline 206-577-4871 [facility] Child Abuse Hotline [phone] (After Hours)  Emergency Shelter:  [facility] [phone]  Maternity Homes: - [facility] at the [place] [facility] [phone] - [facility] [phone]  MRSA Hotline #:   2391472756  Dental Assistance  If unable to pay or uninsured, contact:  [facility]. to become qualified for the adult dental clinic.  Patients with Medicaid: [facility] [phone] [address], [phone] [address], [phone]  If unable to pay, or uninsured, contact [facility] [phone] in [place], [phone] in [facility]) to become qualified for the adult dental clinic  [facility] [address] [place] Phone: [phone] [email or web address]  Other Proofreader Services: - [facility]- [address] [place], [place], [place], [address], [phone], Ext. [phone], 2nd and 4th Thursday of the month at 6:30am.  10 clients each day by appointment, can sometimes see walk-in patients if someone does not show for an appointment. [facility]- [address] [place], [place], [address], [phone] - [facility] [address] [place], [place], [address], [phone] - [facility]- [phone] [facility]-  [phone] [facility] [phone]       Behavioral Health Resources in the [facility]  Intensive Outpatient Programs: [facility]      [address] [place], [place] [phone] Both a day and evening program       [facility] Outpatient     [address]        [place], [place] [address] [phone]         ADS: [facility] [address] [place] [phone]  [facility] ACCESS LINE: (647)[phone] or [phone] [address]  [place], [place] [address] [email or web address]  Behavioral Health Services  Substance Abuse Resources: - [facility]  [phone] - [facility] [phone] - [facility] [phone] Floydene Flock [phone] - Residential & Outpatient Substance Abuse Program  ([phone]  Psychological Services: [facility]  [phone] [facility]  [phone] - [facility], [phone] [place]. [address] [place], ACCESS LINE: (317) [phone] or [phone], [email or web address]  Mobile Crisis Teams:                                        Therapeutic Alternatives         Mobile Crisis Care Unit 4636560302             [facility] [address] Ginette Otto [phone]                                         Interventionist [address] [place] [address] [place] [phone]  Self-Help/Support Groups: Mental Health Assoc. of [employer] of support groups (603)031-5997 (call for more info)  Narcotics Anonymous (NA) [facility] [address] [place] - 2 meetings at this location  Residential Treatment Programs:  [facility]      [address]        [place]       [phone]         [facility] [address] [place]  [address] [place], [place]  [address] [phone]  [facility]  [address] [place], [place] [address] [phone] Admissions: 8am-3pm M-F  [facility]     [address]        [place], [place] [address]       [phone]         [facility] [address] [place], [place] [phone]  [facility] [address] [place], [place] [phone]  [facility] - Intensive Outpatient      [address]     [place], [place]       [phone]         [facility] (Addiction Recovery [facility]     [address] [place], [place] [phone] or [phone]  Residential Treatment Services (RTS), Medicaid [address] [place], [place] [phone]  [facility] [address]                                               [address] [place] [phone]  [facility] Resources: [phone]               General Therapy                                                Angie Fava, PhD        [phone]  [place], [place] [address]         [phone]   Insurance  [facility]   [address] [place], [place] [address] [phone]  [facility] [address] [place], [place] [address] [phone] Insurance/Medicaid/sponsorship through [facility]                                              [address]                                        [place], [place] [address]    Therapy/tele-psych/case         [phone]          [facility] [address], [place]  [address]  Adolescent/group home/case management [phone]                                           Creola Corn PhD       General therapy       Insurance   [phone]         Dr. Lolly Mustache, Insurance, M-F [phone]  [facility] [place]  [employer] [facility] Dept. [address]                 [address]         [address] [place] [address]  [place]ondell Reveal Phone:  [phone]                                  Phone:  [phone]                   Phone:  [phone]  [facility] Health, [phone] - [facility] - [facility]- [phone]       -     [facility] in [place], [address],             [phone], Insurance  [place] Child Abuse Hotline 520-482-8268 or [phone] (After Hours)

## 2012-11-17 NOTE — ED Notes (Signed)
Dysuria and penile d/c x 1 week. Admits to sex only with one partner

## 2012-11-29 ENCOUNTER — Ambulatory Visit (INDEPENDENT_AMBULATORY_CARE_PROVIDER_SITE_OTHER): Payer: Self-pay | Admitting: General Surgery

## 2012-12-08 ENCOUNTER — Encounter (INDEPENDENT_AMBULATORY_CARE_PROVIDER_SITE_OTHER): Payer: Self-pay | Admitting: General Surgery

## 2013-01-10 ENCOUNTER — Emergency Department (HOSPITAL_BASED_OUTPATIENT_CLINIC_OR_DEPARTMENT_OTHER)
Admission: EM | Admit: 2013-01-10 | Discharge: 2013-01-10 | Disposition: A | Discharge status: Home / Self Care | Payer: Self-pay | Attending: Emergency Medicine | Admitting: Emergency Medicine

## 2013-01-10 ENCOUNTER — Emergency Department (HOSPITAL_BASED_OUTPATIENT_CLINIC_OR_DEPARTMENT_OTHER): Payer: Self-pay

## 2013-01-10 ENCOUNTER — Encounter (HOSPITAL_BASED_OUTPATIENT_CLINIC_OR_DEPARTMENT_OTHER): Payer: Self-pay | Admitting: Emergency Medicine

## 2013-01-10 DIAGNOSIS — R112 Nausea with vomiting, unspecified: Secondary | ICD-10-CM | POA: Insufficient documentation

## 2013-01-10 DIAGNOSIS — R6883 Chills (without fever): Secondary | ICD-10-CM | POA: Insufficient documentation

## 2013-01-10 DIAGNOSIS — K219 Gastro-esophageal reflux disease without esophagitis: Secondary | ICD-10-CM | POA: Insufficient documentation

## 2013-01-10 DIAGNOSIS — Z79899 Other long term (current) drug therapy: Secondary | ICD-10-CM | POA: Insufficient documentation

## 2013-01-10 DIAGNOSIS — R079 Chest pain, unspecified: Secondary | ICD-10-CM | POA: Insufficient documentation

## 2013-01-10 DIAGNOSIS — Z792 Long term (current) use of antibiotics: Secondary | ICD-10-CM | POA: Insufficient documentation

## 2013-01-10 HISTORY — DX: Gastro-esophageal reflux disease without esophagitis: K21.9

## 2013-01-10 LAB — CBC WITH DIFFERENTIAL/PLATELET
Basophils Absolute: 0 10*3/uL (ref 0.0–0.1)
Eosinophils Absolute: 0.1 10*3/uL (ref 0.0–0.7)
Eosinophils Relative: 2 % (ref 0–5)
HCT: 44.4 % (ref 39.0–52.0)
Hemoglobin: 15 g/dL (ref 13.0–17.0)
Lymphocytes Relative: 53 % — ABNORMAL HIGH (ref 12–46)
Lymphs Abs: 2.7 10*3/uL (ref 0.7–4.0)
MCH: 28.2 pg (ref 26.0–34.0)
Monocytes Absolute: 0.5 10*3/uL (ref 0.1–1.0)
Neutro Abs: 1.7 10*3/uL (ref 1.7–7.7)
RBC: 5.32 MIL/uL (ref 4.22–5.81)
RDW: 13.8 % (ref 11.5–15.5)
WBC: 5 10*3/uL (ref 4.0–10.5)

## 2013-01-10 LAB — COMPREHENSIVE METABOLIC PANEL
ALT: 25 U/L (ref 0–53)
AST: 22 U/L (ref 0–37)
Albumin: 4 g/dL (ref 3.5–5.2)
CO2: 27 mEq/L (ref 19–32)
Calcium: 9.5 mg/dL (ref 8.4–10.5)
Creatinine, Ser: 1.3 mg/dL (ref 0.50–1.35)
Glucose, Bld: 83 mg/dL (ref 70–99)
Potassium: 4.4 mEq/L (ref 3.5–5.1)
Sodium: 138 mEq/L (ref 135–145)

## 2013-01-10 LAB — URINALYSIS, ROUTINE W REFLEX MICROSCOPIC
Glucose, UA: NEGATIVE mg/dL
Specific Gravity, Urine: 1.028 (ref 1.005–1.030)
Urobilinogen, UA: 1 mg/dL (ref 0.0–1.0)
pH: 6 (ref 5.0–8.0)

## 2013-01-10 LAB — URINE MICROSCOPIC-ADD ON

## 2013-01-10 MED ORDER — ONDANSETRON HCL 4 MG/2ML IJ SOLN
4.0000 mg | Freq: Once | INTRAMUSCULAR | Status: AC
  Administered 2013-01-10: 4 mg via INTRAVENOUS
  Filled 2013-01-10: qty 2

## 2013-01-10 MED ORDER — SODIUM CHLORIDE 0.9 % IV SOLN
1000.0000 mL | Freq: Once | INTRAVENOUS | Status: AC
  Administered 2013-01-10: 1000 mL via INTRAVENOUS

## 2013-01-10 MED ORDER — LANSOPRAZOLE 30 MG PO CPDR: 30.0000 mg | DELAYED_RELEASE_CAPSULE | Freq: Every day | ORAL | Status: DC

## 2013-01-10 MED ORDER — ONDANSETRON 8 MG PO TBDP: 8.0000 mg | ORAL_TABLET | Freq: Three times a day (TID) | ORAL | Status: DC | PRN

## 2013-01-10 MED ORDER — HYDROMORPHONE HCL PF 1 MG/ML IJ SOLN
0.5000 mg | INTRAMUSCULAR | Status: DC | PRN
  Administered 2013-01-10: 0.5 mg via INTRAVENOUS
  Filled 2013-01-10: qty 1

## 2013-01-10 MED ORDER — GI COCKTAIL ~~LOC~~
30.0000 mL | Freq: Once | ORAL | Status: AC
  Administered 2013-01-10: 30 mL via ORAL
  Filled 2013-01-10: qty 30

## 2013-01-10 MED ORDER — SODIUM CHLORIDE 0.9 % IV SOLN: 1000.0000 mL | Freq: Once | INTRAVENOUS | Status: DC

## 2013-01-10 MED ORDER — SODIUM CHLORIDE 0.9 % IV SOLN
1000.0000 mL | INTRAVENOUS | Status: DC
  Administered 2013-01-10: 1000 mL via INTRAVENOUS

## 2013-01-10 NOTE — ED Notes (Signed)
EDP with orders for 2nd L NS to complete prior to d/c

## 2013-01-10 NOTE — ED Provider Notes (Signed)
CSN: [id number]     Arrival date & time 01/10/13  1235 History   First MD Initiated Contact with Patient 01/10/13 1256     Chief Complaint  Patient presents with  . Emesis  . Abdominal Pain    Patient is a 47 y.o. male presenting with vomiting and abdominal pain.  Emesis Severity:  Moderate Timing:  Intermittent Number of daily episodes:  Once this am, 3-4 times previously when trying to eat Quality:  Stomach contents Recent urination:  Normal Relieved by:  Nothing Associated symptoms: abdominal pain and chills   Associated symptoms: no diarrhea   Abdominal Pain Pain location:  Epigastric Pain quality: sharp   Pain radiation: chest. Pain severity:  Severe Onset quality:  Gradual Duration:  3 days Timing:  Constant Progression:  Worsening Chronicity:  New Context: retching   Relieved by:  Nothing Worsened by:  Nothing tried Ineffective treatments:  None tried Associated symptoms: chest pain, chills, nausea and vomiting   Associated symptoms: no cough, no diarrhea, no dysuria, no fatigue and no fever   Risk factors: alcohol abuse     Past Medical History  Diagnosis Date  . GERD (gastroesophageal reflux disease)    Past Surgical History  Procedure Laterality Date  . Back surgery    . Neck surgery     Family History  Problem Relation Age of Onset  . Diabetes Mother   . Hypertension Mother   . Hyperlipidemia Father   . Heart attack Neg Hx   . Sudden death Neg Hx    History  Substance Use Topics  . Smoking status: Never Smoker   . Smokeless tobacco: Never Used  . Alcohol Use: No    Review of Systems  Constitutional: Positive for chills. Negative for fever and fatigue.  Respiratory: Negative for cough.   Cardiovascular: Positive for chest pain.  Gastrointestinal: Positive for nausea, vomiting and abdominal pain. Negative for diarrhea.  Genitourinary: Negative for dysuria.  All other systems reviewed and are negative.    Allergies  Ibuprofen  Home  Medications   Current Outpatient Rx  Name  Route  Sig  Dispense  Refill  . ciprofloxacin (CIPRO) 500 MG tablet   Oral   Take 1 tablet (500 mg total) by mouth 2 (two) times daily.   28 tablet   0   . lansoprazole (PREVACID) 30 MG capsule   Oral   Take 1 capsule (30 mg total) by mouth daily at 12 noon.   14 capsule   0   . ondansetron (ZOFRAN ODT) 8 MG disintegrating tablet   Oral   Take 1 tablet (8 mg total) by mouth every 8 (eight) hours as needed for nausea or vomiting.   20 tablet   0   . EXPIRED: promethazine (PHENERGAN) 25 MG tablet   Oral   Take 1 tablet (25 mg total) by mouth every 6 (six) hours as needed for nausea.   30 tablet   0    BP 130/78  Pulse 73  Resp 16  Ht 5\' 9"  (1.753 m)  Wt 180 lb (81.647 kg)  BMI 26.57 kg/m2  SpO2 100% Physical Exam  Nursing note and vitals reviewed. Constitutional: He appears well-developed and well-nourished. No distress.  HENT:  Head: Normocephalic and atraumatic.  Right Ear: External ear normal.  Left Ear: External ear normal.  Eyes: Conjunctivae are normal. Right eye exhibits no discharge. Left eye exhibits no discharge. No scleral icterus.  Neck: Neck supple. No tracheal deviation present.  Cardiovascular:  Normal rate, regular rhythm and intact distal pulses.   Pulmonary/Chest: Effort normal and breath sounds normal. No stridor. No respiratory distress. He has no wheezes. He has no rales.  Abdominal: Soft. Bowel sounds are normal. He exhibits no distension, no ascites and no mass. There is tenderness in the epigastric area. There is no rebound and no guarding. No hernia.  Musculoskeletal: He exhibits no edema and no tenderness.  Neurological: He is alert. He has normal strength. No sensory deficit. Cranial nerve deficit:  no gross defecits noted. He exhibits normal muscle tone. He displays no seizure activity. Coordination normal.  Skin: Skin is warm and dry. No rash noted.  Psychiatric: He has a normal mood and affect.     ED Course  Procedures (including critical care time) Labs Review Labs Reviewed  COMPREHENSIVE METABOLIC PANEL - Abnormal; Notable for the following:    Total Bilirubin 1.6 (*)    GFR calc non Af Amer 64 (*)    GFR calc Af Amer 75 (*)    All other components within normal limits  URINALYSIS, ROUTINE W REFLEX MICROSCOPIC - Abnormal; Notable for the following:    Color, Urine AMBER (*)    Bilirubin Urine SMALL (*)    Ketones, ur 15 (*)    Leukocytes, UA SMALL (*)    All other components within normal limits  CBC WITH DIFFERENTIAL - Abnormal; Notable for the following:    Neutrophils Relative % 33 (*)    Lymphocytes Relative 53 (*)    All other components within normal limits  URINE MICROSCOPIC-ADD ON - Abnormal; Notable for the following:    Bacteria, UA FEW (*)    All other components within normal limits  URINE CULTURE  LIPASE, BLOOD   Imaging Review Dg Abd Acute W/chest  01/10/2013   CLINICAL DATA:  Epigastric pain, nausea and vomiting  EXAM: ACUTE ABDOMEN SERIES (ABDOMEN 2 VIEW & CHEST 1 VIEW)  COMPARISON:  11/17/2012  FINDINGS: Cardiomediastinal silhouette is stable. No acute infiltrate or pleural effusion. No pulmonary edema. Bony thorax is unremarkable.  There is nonspecific nonobstructive bowel gas pattern. No free abdominal air. Moderate stool in rectum.  IMPRESSION: Negative abdominal radiographs.  No acute cardiopulmonary disease.   Electronically Signed   By: Natasha Mead M.D.   On: 01/10/2013 14:12    EKG Interpretation    Date/Time:  Wednesday January 10 2013 13:35:44 EST Ventricular Rate:  59 PR Interval:  140 QRS Duration: 86 QT Interval:  384 QTC Calculation: 380 R Axis:   66 Text Interpretation:  Sinus bradycardia Otherwise normal ECG No significant change since last tracing Confirmed by Salman Wellen  MD-J, Paton Crum ([id number]) on 01/10/2013 1:43:43 PM            MDM   1. GERD (gastroesophageal reflux disease)    1509  Repeat exam.  No ttp ruq.  ?gerd,  gastritis.  Will dc home with antacids and antinausea meds.  Rec follow up with PCP or GI md    Celene Kras, MD 01/10/13 [phone]

## 2013-01-10 NOTE — ED Notes (Signed)
Pt reports epigastric pain that develops after eating.  He develops nausea and vomiting and unable to keep food or PO fluids down.

## 2013-01-11 LAB — URINE CULTURE
Colony Count: NO GROWTH
Culture: NO GROWTH

## 2013-02-11 ENCOUNTER — Emergency Department (HOSPITAL_BASED_OUTPATIENT_CLINIC_OR_DEPARTMENT_OTHER): Payer: Self-pay

## 2013-02-11 ENCOUNTER — Emergency Department (HOSPITAL_BASED_OUTPATIENT_CLINIC_OR_DEPARTMENT_OTHER)
Admission: EM | Admit: 2013-02-11 | Discharge: 2013-02-11 | Disposition: A | Discharge status: Home / Self Care | Payer: Self-pay | Attending: Emergency Medicine | Admitting: Emergency Medicine

## 2013-02-11 ENCOUNTER — Encounter (HOSPITAL_BASED_OUTPATIENT_CLINIC_OR_DEPARTMENT_OTHER): Payer: Self-pay | Admitting: Emergency Medicine

## 2013-02-11 DIAGNOSIS — Z79899 Other long term (current) drug therapy: Secondary | ICD-10-CM | POA: Insufficient documentation

## 2013-02-11 DIAGNOSIS — L03119 Cellulitis of unspecified part of limb: Principal | ICD-10-CM

## 2013-02-11 DIAGNOSIS — K219 Gastro-esophageal reflux disease without esophagitis: Secondary | ICD-10-CM | POA: Insufficient documentation

## 2013-02-11 DIAGNOSIS — F17200 Nicotine dependence, unspecified, uncomplicated: Secondary | ICD-10-CM | POA: Insufficient documentation

## 2013-02-11 DIAGNOSIS — R42 Dizziness and giddiness: Secondary | ICD-10-CM | POA: Insufficient documentation

## 2013-02-11 DIAGNOSIS — L0390 Cellulitis, unspecified: Secondary | ICD-10-CM

## 2013-02-11 DIAGNOSIS — Z23 Encounter for immunization: Secondary | ICD-10-CM | POA: Insufficient documentation

## 2013-02-11 DIAGNOSIS — Z792 Long term (current) use of antibiotics: Secondary | ICD-10-CM | POA: Insufficient documentation

## 2013-02-11 DIAGNOSIS — L02519 Cutaneous abscess of unspecified hand: Secondary | ICD-10-CM | POA: Insufficient documentation

## 2013-02-11 LAB — CBC WITH DIFFERENTIAL/PLATELET
BASOS ABS: 0 10*3/uL (ref 0.0–0.1)
BASOS PCT: 0 % (ref 0–1)
EOS ABS: 0.2 10*3/uL (ref 0.0–0.7)
EOS PCT: 3 % (ref 0–5)
HCT: 38.4 % — ABNORMAL LOW (ref 39.0–52.0)
Hemoglobin: 12.9 g/dL — ABNORMAL LOW (ref 13.0–17.0)
Lymphocytes Relative: 44 % (ref 12–46)
Lymphs Abs: 2.8 10*3/uL (ref 0.7–4.0)
MCH: 28 pg (ref 26.0–34.0)
MCHC: 33.6 g/dL (ref 30.0–36.0)
MCV: 83.5 fL (ref 78.0–100.0)
Monocytes Absolute: 0.8 10*3/uL (ref 0.1–1.0)
Monocytes Relative: 12 % (ref 3–12)
NEUTROS PCT: 41 % — AB (ref 43–77)
Neutro Abs: 2.7 10*3/uL (ref 1.7–7.7)
Platelets: 326 10*3/uL (ref 150–400)
RBC: 4.6 MIL/uL (ref 4.22–5.81)
RDW: 14 % (ref 11.5–15.5)
WBC: 6.5 10*3/uL (ref 4.0–10.5)

## 2013-02-11 LAB — BASIC METABOLIC PANEL
BUN: 22 mg/dL (ref 6–23)
CALCIUM: 8.6 mg/dL (ref 8.4–10.5)
CO2: 25 mEq/L (ref 19–32)
Chloride: 103 mEq/L (ref 96–112)
Creatinine, Ser: 1.4 mg/dL — ABNORMAL HIGH (ref 0.50–1.35)
GFR, EST AFRICAN AMERICAN: 68 mL/min — AB (ref >90)
GFR, EST NON AFRICAN AMERICAN: 59 mL/min — AB (ref >90)
Glucose, Bld: 134 mg/dL — ABNORMAL HIGH (ref 70–99)
POTASSIUM: 4.3 meq/L (ref 3.7–5.3)
SODIUM: 142 meq/L (ref 137–147)

## 2013-02-11 MED ORDER — DOXYCYCLINE HYCLATE 100 MG PO CAPS: 100.0000 mg | ORAL_CAPSULE | Freq: Two times a day (BID) | ORAL | Status: DC

## 2013-02-11 MED ORDER — OXYCODONE-ACETAMINOPHEN 5-325 MG PO TABS: 1.0000 | ORAL_TABLET | Freq: Four times a day (QID) | ORAL | Status: DC | PRN

## 2013-02-11 MED ORDER — OXYCODONE-ACETAMINOPHEN 5-325 MG PO TABS
1.0000 | ORAL_TABLET | Freq: Once | ORAL | Status: AC
  Administered 2013-02-11: 1 via ORAL
  Filled 2013-02-11: qty 1

## 2013-02-11 MED ORDER — CEFAZOLIN SODIUM 1 G IJ SOLR
1.0000 g | Freq: Once | INTRAMUSCULAR | Status: AC
  Administered 2013-02-11: 1 g via INTRAMUSCULAR
  Filled 2013-02-11: qty 10

## 2013-02-11 MED ORDER — CEPHALEXIN 500 MG PO CAPS: 500.0000 mg | ORAL_CAPSULE | Freq: Four times a day (QID) | ORAL | Status: DC

## 2013-02-11 MED ORDER — TETANUS-DIPHTH-ACELL PERTUSSIS 5-2.5-18.5 LF-MCG/0.5 IM SUSP
0.5000 mL | Freq: Once | INTRAMUSCULAR | Status: AC
  Administered 2013-02-11: 0.5 mL via INTRAMUSCULAR
  Filled 2013-02-11: qty 0.5

## 2013-02-11 NOTE — ED Notes (Signed)
Pt reports he woke up this morning and noticed his right hand, right forearm and left forearm are painful and swollen. ?insect bite. States he has been feeling dizzy

## 2013-02-11 NOTE — ED Provider Notes (Signed)
CSN: [id number]     Arrival date & time 02/11/13  0037 History  This chart was scribed for Dacey Milberger Smitty CordsK Lidwina Kaner-Rasch, MD by Blanchard KelchNicole Curnes, ED Scribe. The patient was seen in room MH07/MH07. Patient's care was started at 12:51 AM.    Chief Complaint  Patient presents with  . Hand Pain   Patient is a 48 y.o. male presenting with hand pain. The history is provided by the patient. No language interpreter was used.  Hand Pain This is a new problem. The current episode started 12 to 24 hours ago. The problem occurs constantly. The problem has not changed since onset.Pertinent negatives include no chest pain, no abdominal pain, no headaches and no shortness of breath. Nothing aggravates the symptoms. Nothing relieves the symptoms. Treatments tried: tylenol. The treatment provided no relief.    HPI Comments: Apolinar JunesHoward J Wingler is a 48 y.o. male who presents to the Emergency Department complaining of right hand pain and associated swelling that began this morning upon waking. He describes the pain as burning and sore. He has been putting rubbing alcohol on the hand and taking Tylenol without relief. He has had associated dizziness. He denies any acute injury or trauma to the area that he can think of. He denies any new medications. He was recently sick with a cough, congestion and rhinorrhea, but states those symptoms are significantly improved. He does not know if he is up to date on tetanus vaccination. He is right handed.    Past Medical History  Diagnosis Date  . GERD (gastroesophageal reflux disease)    Past Surgical History  Procedure Laterality Date  . Back surgery    . Neck surgery     Family History  Problem Relation Age of Onset  . Diabetes Mother   . Hypertension Mother   . Hyperlipidemia Father   . Heart attack Neg Hx   . Sudden death Neg Hx    History  Substance Use Topics  . Smoking status: Current Every Day Smoker -- 15 years    Types: Cigarettes    Last Attempt to Quit: 08/08/2012   . Smokeless tobacco: Never Used  . Alcohol Use: No    Review of Systems  Respiratory: Negative for shortness of breath.   Cardiovascular: Negative for chest pain.  Gastrointestinal: Negative for abdominal pain.  Musculoskeletal: Positive for arthralgias.  Neurological: Negative for headaches.  All other systems reviewed and are negative.    Allergies  Ibuprofen  Home Medications   Current Outpatient Rx  Name  Route  Sig  Dispense  Refill  . ciprofloxacin (CIPRO) 500 MG tablet   Oral   Take 1 tablet (500 mg total) by mouth 2 (two) times daily.   28 tablet   0   . lansoprazole (PREVACID) 30 MG capsule   Oral   Take 1 capsule (30 mg total) by mouth daily at 12 noon.   14 capsule   0   . ondansetron (ZOFRAN ODT) 8 MG disintegrating tablet   Oral   Take 1 tablet (8 mg total) by mouth every 8 (eight) hours as needed for nausea or vomiting.   20 tablet   0   . EXPIRED: promethazine (PHENERGAN) 25 MG tablet   Oral   Take 1 tablet (25 mg total) by mouth every 6 (six) hours as needed for nausea.   30 tablet   0    Triage Vitals: BP 107/69  Pulse 85  Temp(Src) 98.5 F (36.9 C) (Oral)  Resp 18  Ht 5\' 11"  (1.803 m)  Wt 180 lb (81.647 kg)  BMI 25.12 kg/m2  SpO2 98%  Physical Exam  Nursing note and vitals reviewed. Constitutional: He is oriented to person, place, and time. He appears well-developed and well-nourished. No distress.  HENT:  Mouth/Throat: Oropharynx is clear and moist. No oropharyngeal exudate.  Eyes: Pupils are equal, round, and reactive to light.  Neck: Normal range of motion. Neck supple.  Cardiovascular: Normal rate, regular rhythm and intact distal pulses.   Pulses:      Radial pulses are 2+ on the right side.  Pulmonary/Chest: Effort normal. No respiratory distress. He has no wheezes. He has no rales.  Abdominal: Soft. Bowel sounds are normal. There is no tenderness.  Musculoskeletal: Normal range of motion. He exhibits edema.  No snuff box  tenderness of the right wrist. Dorsal swelling. No palmar swelling. Tendons intact. Right hand NVI. No fluctuance.  Neurological: He is alert and oriented to person, place, and time. He displays normal reflexes.  Skin: Skin is warm and dry. There is erythema.  Cap refill < 2 seconds right hand neurovascularly intact FROM sensation intact.      ED Course  Procedures (including critical care time)  DIAGNOSTIC STUDIES: Oxygen Saturation is 98% on room air, normal by my interpretation.    COORDINATION OF CARE: 12:58 AM -Will order right hand x-ray. Patient verbalizes understanding and agrees with treatment plan.    Labs Review Labs Reviewed - No data to display Imaging Review No results found.  EKG Interpretation   None       MDM  No diagnosis found. Tetanus updated ancef given will start doxycycline and kelfex follow up with hand surgery this week.    I personally performed the services described in this documentation, which was scribed in my presence. The recorded information has been reviewed and is accurate.     Jasmine Awe, MD 02/11/13 [phone]

## 2013-02-11 NOTE — Discharge Instructions (Signed)
Cellulitis °Cellulitis is an infection of the skin and the tissue under the skin. The infected area is usually red and tender. This happens most often in the arms and lower legs. °HOME CARE  °· Take your antibiotic medicine as told. Finish the medicine even if you start to feel better. °· Keep the infected arm or leg raised (elevated). °· Put a warm cloth on the area up to 4 times per day. °· Only take medicines as told by your doctor. °· Keep all doctor visits as told. °GET HELP RIGHT AWAY IF:  °· You have a fever. °· You feel very sleepy. °· You throw up (vomit) or have watery poop (diarrhea). °· You feel sick and have muscle aches and pains. °· You see red streaks on the skin coming from the infected area. °· Your red area gets bigger or turns a dark color. °· Your bone or joint under the infected area is painful after the skin heals. °· Your infection comes back in the same area or different area. °· You have a puffy (swollen) bump in the infected area. °· You have new symptoms. °MAKE SURE YOU:  °· Understand these instructions. °· Will watch your condition. °· Will get help right away if you are not doing well or get worse. °Document Released: 06/30/2007 Document Revised: 07/13/2011 Document Reviewed: 03/29/2011 °[employer]® Patient Information ©2014 [employer]. ° °

## 2013-02-11 NOTE — ED Notes (Signed)
Pt reports he woke with swelling to right hand and both forearms. Skin warm to touch. Denies known injury

## 2013-02-11 NOTE — ED Notes (Signed)
Rx x 3 given for keflex, percocet and doxy- pt has called family for ride

## 2013-02-19 ENCOUNTER — Emergency Department (HOSPITAL_COMMUNITY)
Admission: EM | Admit: 2013-02-19 | Discharge: 2013-02-19 | Disposition: A | Discharge status: Home / Self Care | Payer: Self-pay | Attending: Emergency Medicine | Admitting: Emergency Medicine

## 2013-02-19 ENCOUNTER — Emergency Department (HOSPITAL_COMMUNITY): Payer: Self-pay

## 2013-02-19 ENCOUNTER — Encounter (HOSPITAL_COMMUNITY): Payer: Self-pay | Admitting: Emergency Medicine

## 2013-02-19 DIAGNOSIS — Z8719 Personal history of other diseases of the digestive system: Secondary | ICD-10-CM | POA: Insufficient documentation

## 2013-02-19 DIAGNOSIS — M25449 Effusion, unspecified hand: Secondary | ICD-10-CM | POA: Insufficient documentation

## 2013-02-19 DIAGNOSIS — M779 Enthesopathy, unspecified: Secondary | ICD-10-CM

## 2013-02-19 DIAGNOSIS — F17200 Nicotine dependence, unspecified, uncomplicated: Secondary | ICD-10-CM | POA: Insufficient documentation

## 2013-02-19 DIAGNOSIS — M65849 Other synovitis and tenosynovitis, unspecified hand: Principal | ICD-10-CM

## 2013-02-19 DIAGNOSIS — Z792 Long term (current) use of antibiotics: Secondary | ICD-10-CM | POA: Insufficient documentation

## 2013-02-19 DIAGNOSIS — M65839 Other synovitis and tenosynovitis, unspecified forearm: Secondary | ICD-10-CM | POA: Insufficient documentation

## 2013-02-19 DIAGNOSIS — R209 Unspecified disturbances of skin sensation: Secondary | ICD-10-CM | POA: Insufficient documentation

## 2013-02-19 MED ORDER — NAPROXEN 500 MG PO TABS: 500.0000 mg | ORAL_TABLET | Freq: Two times a day (BID) | ORAL | Status: DC

## 2013-02-19 NOTE — ED Provider Notes (Signed)
Medical screening examination/treatment/procedure(s) were performed by non-physician practitioner and as supervising physician I was immediately available for consultation/collaboration.  EKG Interpretation   None        Avy Barlett F Dekota Kirlin, MD 02/19/13 1936 

## 2013-02-19 NOTE — ED Notes (Signed)
Pt c/o right hand pain x 2 wks

## 2013-02-19 NOTE — ED Provider Notes (Signed)
CSN: [id number]     Arrival date & time 02/19/13  1314 History  This chart was scribed for non-physician practitioner, Mardee PostinFrances Khylin Gutridge-PA, working with Shon Batonourtney F Horton, MD by Smiley HousemanFallon Davis, ED Scribe. This patient was seen in room WTR6/WTR6 and the patient's care was started at 3:03 PM.  Chief Complaint  Patient presents with  . Hand Pain   The history is provided by the patient. No language interpreter was used.   HPI Comments: Apolinar JunesHoward J Vantol is a 48 y.o. male who presents to the Emergency Department complaining of constant right hand pain that started last Saturday.  Pt states that he went to work on Saturday and when he arrived home his right hand was swollen and painful.  He states that he thought it was a spider bite.  Pt was seen for similar complaint on 02/11/13 and prescribed antibiotics without relief.  Pt states that he began to have swelling in the left hand too, but it has subsided.  He reports that he has little strength in his right hand.  Pt has tingling with palpation.  Pt denies any injury to the area.    Past Medical History  Diagnosis Date  . GERD (gastroesophageal reflux disease)    Past Surgical History  Procedure Laterality Date  . Back surgery    . Neck surgery     Family History  Problem Relation Age of Onset  . Diabetes Mother   . Hypertension Mother   . Hyperlipidemia Father   . Heart attack Neg Hx   . Sudden death Neg Hx    History  Substance Use Topics  . Smoking status: Current Every Day Smoker -- 15 years    Types: Cigarettes    Last Attempt to Quit: 08/08/2012  . Smokeless tobacco: Never Used  . Alcohol Use: No    Review of Systems  Constitutional: Negative for fever and chills.  HENT: Negative for congestion and rhinorrhea.   Respiratory: Negative for cough and shortness of breath.   Cardiovascular: Negative for chest pain.  Gastrointestinal: Negative for nausea, vomiting, abdominal pain and diarrhea.  Musculoskeletal: Positive for  arthralgias (Right Hand). Negative for back pain.  Skin: Negative for color change and rash.  Neurological: Negative for syncope.  All other systems reviewed and are negative.    Allergies  Ibuprofen  Home Medications   Current Outpatient Rx  Name  Route  Sig  Dispense  Refill  . cephALEXin (KEFLEX) 500 MG capsule   Oral   Take 1 capsule (500 mg total) by mouth 4 (four) times daily.   28 capsule   0   . oxyCODONE-acetaminophen (PERCOCET) 5-325 MG per tablet   Oral   Take 1 tablet by mouth every 6 (six) hours as needed for moderate pain.   10 tablet   0    Triage Vitals: BP 103/74  Pulse 71  Temp(Src) 98 F (36.7 C) (Oral)  Resp 20  SpO2 100% Physical Exam  Nursing note and vitals reviewed. Constitutional: He is oriented to person, place, and time. He appears well-developed and well-nourished. No distress.  HENT:  Head: Normocephalic and atraumatic.  Mouth/Throat: Oropharynx is clear and moist.  Eyes: Conjunctivae are normal. No scleral icterus.  Pulmonary/Chest: Effort normal.  Musculoskeletal:       Right hand: He exhibits tenderness, bony tenderness and swelling. He exhibits normal range of motion, normal capillary refill and no deformity. Normal sensation noted. Normal strength noted.       Hands: + Tinel's  test  Neurological: He is alert and oriented to person, place, and time. He exhibits normal muscle tone. Coordination normal.  Skin: Skin is warm and dry. No rash noted. No erythema. No pallor.  Psychiatric: He has a normal mood and affect. His behavior is normal. Judgment and thought content normal.    ED Course  Procedures (including critical care time) DIAGNOSTIC STUDIES: Oxygen Saturation is 100% on RA, normal by my interpretation.    COORDINATION OF CARE: 3:10 PM-Discussed normal x-ray results with pt.  Will order wrist splint.  Patient informed of current plan of treatment and evaluation and agrees with plan.    Labs Review Labs Reviewed - No  data to display Imaging Review Dg Hand Complete Right  02/19/2013   CLINICAL DATA:  Right hand pain  EXAM: RIGHT HAND - COMPLETE 3+ VIEW  COMPARISON:  02/11/2013  FINDINGS: Three views of the right hand submitted. No acute fracture or subluxation. Mild degenerative changes first carpometacarpal joint.  IMPRESSION: No acute fracture or subluxation. Mild degenerative changes first carpometacarpal joint.   Electronically Signed   By: Natasha Mead M.D.   On: 02/19/2013 14:15    MDM  Tendonitis  Patient returns with right hand pain - reports that he does repetitive movements, has not followed up with hand surgery because of costs, has no improvement despite the medication - I do not suspect infectious source to his pain, seems more inflammatory.  No fracture noted.  Placed in wrist splint and will follow up with hand surgery.  I personally performed the services described in this documentation, which was scribed in my presence. The recorded information has been reviewed and is accurate.     Izola Price Marisue Humble, PA-C 02/19/13 1545

## 2013-02-19 NOTE — Discharge Instructions (Signed)
Tendinitis Tendinitis is swelling and inflammation of the tendons. Tendons are band-like tissues that connect muscle to bone. Tendinitis commonly occurs in the:   Shoulders (rotator cuff).  Heels (Achilles tendon).  Elbows (triceps tendon). CAUSES Tendinitis is usually caused by overusing the tendon, muscles, and joints involved. When the tissue surrounding a tendon (synovium) becomes inflamed, it is called tenosynovitis. Tendinitis commonly develops in people whose jobs require repetitive motions. SYMPTOMS  Pain.  Tenderness.  Mild swelling. DIAGNOSIS Tendinitis is usually diagnosed by physical exam. Your caregiver may also order X-rays or other imaging tests. TREATMENT Your caregiver may recommend certain medicines or exercises for your treatment. HOME CARE INSTRUCTIONS   Use a sling or splint for as long as directed by your caregiver until the pain decreases.  Put ice on the injured area.  Put ice in a plastic bag.  Place a towel between your skin and the bag.  Leave the ice on for 15-20 minutes, 03-04 times a day.  Avoid using the limb while the tendon is painful. Perform gentle range of motion exercises only as directed by your caregiver. Stop exercises if pain or discomfort increase, unless directed otherwise by your caregiver.  Only take over-the-counter or prescription medicines for pain, discomfort, or fever as directed by your caregiver. SEEK MEDICAL CARE IF:   Your pain and swelling increase.  You develop new, unexplained symptoms, especially increased numbness in the hands. MAKE SURE YOU:   Understand these instructions.  Will watch your condition.  Will get help right away if you are not doing well or get worse. Document Released: 01/09/2000 Document Revised: 04/05/2011 Document Reviewed: 03/30/2010 [facility] Patient Information 2014 [place], [place].  Repetitive Strain Injuries Repetitive strain injuries (RSIs) result from overuse or misuse of soft  tissues including muscles, tendons, or nerves. Tendons are the cord-like structures that attach muscles to bones. RSIs can affect almost any part of the body. However, RSIs are most common in the arms (thumbs, wrists, elbows, shoulders) and legs (ankles, knees). Common medical conditions that are often caused by repetitive strain include carpal tunnel syndrome, tennis or golfer's elbow, bursitis, and tendonitis. If RSIs are treated early, and therepeated activity is reduced or removed, the severity and length of your problems can usually be reduced. RSIs are also called cumulative trauma disorders (CTD).  CAUSES  Many RSIs occur due to repeating the same activity at work over weeks or months without sufficient rest, such as prolonged typing. RSIs also commonly occur when a hobby or sport is done repeatedly without sufficient rest. RSIs can also occur due to repeated strain or stress on a body part in someone who has one or more risk factors for RSIs. RISK FACTORS Workplace risk factors  Frequent computer use, especially if your workstation is not adjusted for your body type.  Infrequent rest breaks.  Working in a high-pressure environment.  Working at a [employer].  Repeating the same motion, such as frequent typing.  Working in an awkward position or holding the same position for a long time.  Forceful movements such as lifting, pulling, or pushing.  Vibration caused by using power tools.  Working in cold temperatures.  Job stress. Personal risk factors  Poor posture.  Being loose-jointed.  Not exercising regularly.  Being overweight.  Arthritis, diabetes, thyroid problems, or other long-term (chronic)medical conditions.  Vitamin deficiencies.  Keeping your fingernails long.  An unhealthy, stressful, or inactive lifestyle.  Not sleeping well. SYMPTOMS  Symptoms often begin at work but become more noticeable  after the repeated stress has ended. For example, you may  develop fatigue or soreness in your wrist while typingat work, and at night you may develop numbness and tingling in your fingers. Common symptoms include:   Burning, shooting, or aching pain, especially in the fingers, palms, wrists, forearms, or shoulders.  Tenderness.  Swelling.  Tingling, numbness, or loss of feeling.  Pain with certain activities, such as turning a doorknob or reaching above your head.  Weakness, heaviness, or loss of coordination in yourhand.  Muscle spasms or tightness. In some cases, symptoms can become so intense that it is difficult to perform everyday tasks. Symptoms that do not improve with rest may indicate a more serious condition.  DIAGNOSIS  Your caregiver may determine the type ofRSI you have based on your medical evaluation and a description of your activities.  TREATMENT  Treatment depends on the severity and type of RSI you have. Your caregiver may recommend rest for the affected body part, medicines, and physical or occupational therapy to reduce pain, swelling, and soreness. Discuss the activities you do repeatedly with your caregiver. Your caregiver can help you decide whether you need to change your activities. An RSI may take months or years to heal, especially if the affected body part gets insufficient rest. In some cases, such as severe carpal tunnel syndrome, surgery may be recommended. PREVENTION  Talk with your supervisor to make sure you have the proper equipment for your work station.  Maintain good posture at your desk or work station with:  Feet flat on the floor.  Knees directly over the feet, bent at a right angle.  Lower back supported by your chair or a cushion in the curve of your lower back.  Shoulders and arms relaxed and at your sides.  Neck relaxed and not bent forwards or backwards.  Your desk and computer workstation properly adjusted to your body type.  Your chair adjusted so there is no excess pressure on the  back of your thighs.  The keyboard resting above your thighs. You should be able to reach the keys with your elbows at your side, bent at a right angle. Your arms should be supported on forearm rests, with your forearms parallel to the ground.  The computer mouse within easy reach.  The monitor directly in front of you, so that your eyes are aligned with the top of the screen. The screen should be about 15 to 25 inches from your eyes.  While typing, keep your wrist straight, in a neutral position. Move your entire arm when you move your mouse or when typing hard-to-reach keys.  Only use your computer as much as you need to for work. Do not use it during breaks.  Take breaks often from any repeated activity. Alternate with another task which requires you to use different muscles, or rest at least once every hour.  Change positions regularly. If you spend a lot of time sitting, get up, walk around, and stretch.  Do not hold pens or pencils tightly when writing.  Exercise regularly.  Maintain a normal weight.  Eat a diet with plenty of vegetables, whole grains, and fruit.  Get sufficient, restful sleep. HOME CARE INSTRUCTIONS  If your caregiver prescribed medicine to help reduce swelling, take it as directed.  Only take over-the-counter or prescription medicines for pain, discomfort, or fever as directed by your caregiver.  Reduce, and if needed, stopthe activities that are causing your problems until you have no further symptoms.If your symptoms  are work-related, you may need to talk to your supervisor about changing your activities.  When symptoms develop, put ice or a cold pack on the aching area.  Put ice in a plastic bag.  Place a towel between your skin and the bag.  Leave the ice on for 15-20 minutes.  If you were given a splint to keep your wrist from bending, wear it as instructed. It is important to wear the splint at night. Use the splint for as long as your  caregiver recommends. SEEK MEDICAL CARE IF:  You develop new problems.  Your problems do not get better with medicine. MAKE SURE YOU:  Understand these instructions.  Will watch your condition.  Will get help right away if you are not doing well or get worse. Document Released: 01/01/2002 Document Revised: 07/13/2011 Document Reviewed: 03/04/2011 [facility] Patient Information 2014 [place], [place].

## 2013-02-19 NOTE — Progress Notes (Signed)
[facility] CL provided pt with a [employer] Orange Card application, list of primary care resource, and a ACA information.

## 2013-05-17 DIAGNOSIS — F339 Major depressive disorder, recurrent, unspecified: Secondary | ICD-10-CM | POA: Insufficient documentation

## 2013-07-24 ENCOUNTER — Inpatient Hospital Stay (HOSPITAL_COMMUNITY)
Admission: AD | Admit: 2013-07-24 | Discharge: 2013-07-30 | DRG: 897 | Disposition: A | Discharge status: Rehabilitation Facility | Payer: Federal, State, Local not specified - Other | Source: Intra-hospital | Attending: Emergency Medicine | Admitting: Emergency Medicine

## 2013-07-24 ENCOUNTER — Encounter (HOSPITAL_COMMUNITY): Payer: Self-pay | Admitting: Emergency Medicine

## 2013-07-24 ENCOUNTER — Emergency Department (HOSPITAL_COMMUNITY)
Admission: EM | Admit: 2013-07-24 | Discharge: 2013-07-24 | Disposition: A | Discharge status: Other Acute Inpatient Hospital | Payer: Self-pay | Attending: Emergency Medicine | Admitting: Emergency Medicine

## 2013-07-24 DIAGNOSIS — Z833 Family history of diabetes mellitus: Secondary | ICD-10-CM | POA: Diagnosis not present

## 2013-07-24 DIAGNOSIS — M545 Low back pain, unspecified: Secondary | ICD-10-CM | POA: Diagnosis present

## 2013-07-24 DIAGNOSIS — Z8249 Family history of ischemic heart disease and other diseases of the circulatory system: Secondary | ICD-10-CM

## 2013-07-24 DIAGNOSIS — F339 Major depressive disorder, recurrent, unspecified: Secondary | ICD-10-CM | POA: Diagnosis present

## 2013-07-24 DIAGNOSIS — F1220 Cannabis dependence, uncomplicated: Secondary | ICD-10-CM | POA: Insufficient documentation

## 2013-07-24 DIAGNOSIS — K920 Hematemesis: Secondary | ICD-10-CM | POA: Diagnosis present

## 2013-07-24 DIAGNOSIS — Z5989 Other problems related to housing and economic circumstances: Secondary | ICD-10-CM | POA: Diagnosis not present

## 2013-07-24 DIAGNOSIS — F1020 Alcohol dependence, uncomplicated: Secondary | ICD-10-CM | POA: Diagnosis present

## 2013-07-24 DIAGNOSIS — K2961 Other gastritis with bleeding: Secondary | ICD-10-CM

## 2013-07-24 DIAGNOSIS — F1910 Other psychoactive substance abuse, uncomplicated: Secondary | ICD-10-CM

## 2013-07-24 DIAGNOSIS — F1310 Sedative, hypnotic or anxiolytic abuse, uncomplicated: Secondary | ICD-10-CM | POA: Diagnosis present

## 2013-07-24 DIAGNOSIS — F333 Major depressive disorder, recurrent, severe with psychotic symptoms: Secondary | ICD-10-CM | POA: Diagnosis present

## 2013-07-24 DIAGNOSIS — F1410 Cocaine abuse, uncomplicated: Secondary | ICD-10-CM | POA: Insufficient documentation

## 2013-07-24 DIAGNOSIS — G4700 Insomnia, unspecified: Secondary | ICD-10-CM | POA: Diagnosis present

## 2013-07-24 DIAGNOSIS — Z598 Other problems related to housing and economic circumstances: Secondary | ICD-10-CM | POA: Diagnosis not present

## 2013-07-24 DIAGNOSIS — F329 Major depressive disorder, single episode, unspecified: Secondary | ICD-10-CM | POA: Insufficient documentation

## 2013-07-24 DIAGNOSIS — K922 Gastrointestinal hemorrhage, unspecified: Secondary | ICD-10-CM | POA: Diagnosis present

## 2013-07-24 DIAGNOSIS — G8929 Other chronic pain: Secondary | ICD-10-CM | POA: Diagnosis present

## 2013-07-24 DIAGNOSIS — F17200 Nicotine dependence, unspecified, uncomplicated: Secondary | ICD-10-CM | POA: Diagnosis present

## 2013-07-24 DIAGNOSIS — F1420 Cocaine dependence, uncomplicated: Secondary | ICD-10-CM | POA: Diagnosis present

## 2013-07-24 DIAGNOSIS — F1994 Other psychoactive substance use, unspecified with psychoactive substance-induced mood disorder: Secondary | ICD-10-CM | POA: Diagnosis present

## 2013-07-24 DIAGNOSIS — K219 Gastro-esophageal reflux disease without esophagitis: Secondary | ICD-10-CM | POA: Diagnosis present

## 2013-07-24 DIAGNOSIS — F411 Generalized anxiety disorder: Secondary | ICD-10-CM | POA: Diagnosis present

## 2013-07-24 DIAGNOSIS — R45851 Suicidal ideations: Secondary | ICD-10-CM | POA: Insufficient documentation

## 2013-07-24 DIAGNOSIS — F3289 Other specified depressive episodes: Secondary | ICD-10-CM | POA: Insufficient documentation

## 2013-07-24 DIAGNOSIS — F331 Major depressive disorder, recurrent, moderate: Secondary | ICD-10-CM

## 2013-07-24 DIAGNOSIS — Z79899 Other long term (current) drug therapy: Secondary | ICD-10-CM | POA: Insufficient documentation

## 2013-07-24 DIAGNOSIS — F259 Schizoaffective disorder, unspecified: Secondary | ICD-10-CM | POA: Diagnosis present

## 2013-07-24 DIAGNOSIS — Z5987 Material hardship due to limited financial resources, not elsewhere classified: Secondary | ICD-10-CM

## 2013-07-24 DIAGNOSIS — F1210 Cannabis abuse, uncomplicated: Secondary | ICD-10-CM | POA: Diagnosis present

## 2013-07-24 DIAGNOSIS — F1024 Alcohol dependence with alcohol-induced mood disorder: Secondary | ICD-10-CM | POA: Diagnosis present

## 2013-07-24 DIAGNOSIS — Z8719 Personal history of other diseases of the digestive system: Secondary | ICD-10-CM | POA: Insufficient documentation

## 2013-07-24 DIAGNOSIS — F332 Major depressive disorder, recurrent severe without psychotic features: Secondary | ICD-10-CM

## 2013-07-24 HISTORY — DX: Major depressive disorder, single episode, unspecified: F32.9

## 2013-07-24 HISTORY — DX: Depression, unspecified: F32.A

## 2013-07-24 LAB — COMPREHENSIVE METABOLIC PANEL
ALBUMIN: 4.1 g/dL (ref 3.5–5.2)
ALT: 38 U/L (ref 0–53)
AST: 30 U/L (ref 0–37)
Alkaline Phosphatase: 53 U/L (ref 39–117)
BUN: 10 mg/dL (ref 6–23)
CO2: 21 mEq/L (ref 19–32)
Calcium: 9.2 mg/dL (ref 8.4–10.5)
Chloride: 100 mEq/L (ref 96–112)
Creatinine, Ser: 1.1 mg/dL (ref 0.50–1.35)
GFR calc Af Amer: >90 mL/min (ref >90)
GFR calc non Af Amer: 78 mL/min — ABNORMAL LOW (ref >90)
Glucose, Bld: 97 mg/dL (ref 70–99)
POTASSIUM: 3.8 meq/L (ref 3.7–5.3)
SODIUM: 141 meq/L (ref 137–147)
TOTAL PROTEIN: 7.5 g/dL (ref 6.0–8.3)
Total Bilirubin: 1 mg/dL (ref 0.3–1.2)

## 2013-07-24 LAB — RAPID URINE DRUG SCREEN, HOSP PERFORMED
Amphetamines: NOT DETECTED
Barbiturates: NOT DETECTED
Benzodiazepines: NOT DETECTED
COCAINE: POSITIVE — AB
OPIATES: NOT DETECTED
TETRAHYDROCANNABINOL: POSITIVE — AB

## 2013-07-24 LAB — CBC
HCT: 42.1 % (ref 39.0–52.0)
Hemoglobin: 14.2 g/dL (ref 13.0–17.0)
MCH: 29.1 pg (ref 26.0–34.0)
MCHC: 33.7 g/dL (ref 30.0–36.0)
MCV: 86.3 fL (ref 78.0–100.0)
Platelets: 352 10*3/uL (ref 150–400)
RBC: 4.88 MIL/uL (ref 4.22–5.81)
RDW: 14.5 % (ref 11.5–15.5)
WBC: 8.6 10*3/uL (ref 4.0–10.5)

## 2013-07-24 LAB — ACETAMINOPHEN LEVEL: Acetaminophen (Tylenol), Serum: <15 ug/mL (ref 10–30)

## 2013-07-24 LAB — ETHANOL: ALCOHOL ETHYL (B): 17 mg/dL — AB (ref 0–11)

## 2013-07-24 LAB — SALICYLATE LEVEL

## 2013-07-24 MED ORDER — CHLORDIAZEPOXIDE HCL 25 MG PO CAPS
25.0000 mg | ORAL_CAPSULE | Freq: Three times a day (TID) | ORAL | Status: AC
Start: 2013-07-26 — End: 2013-07-27
  Administered 2013-07-26 – 2013-07-27 (×3): 25 mg via ORAL
  Filled 2013-07-24 (×3): qty 1

## 2013-07-24 MED ORDER — HYDROXYZINE HCL 25 MG PO TABS
25.0000 mg | ORAL_TABLET | Freq: Four times a day (QID) | ORAL | Status: AC | PRN
  Administered 2013-07-25: 25 mg via ORAL
  Filled 2013-07-24: qty 1

## 2013-07-24 MED ORDER — CHLORDIAZEPOXIDE HCL 25 MG PO CAPS: 25.0000 mg | ORAL_CAPSULE | Freq: Four times a day (QID) | ORAL | Status: AC | PRN

## 2013-07-24 MED ORDER — ADULT MULTIVITAMIN W/MINERALS CH
1.0000 | ORAL_TABLET | Freq: Every day | ORAL | Status: DC
  Administered 2013-07-25 – 2013-07-29 (×5): 1 via ORAL
  Filled 2013-07-24 (×7): qty 1

## 2013-07-24 MED ORDER — PRAZOSIN HCL 1 MG PO CAPS
1.0000 mg | ORAL_CAPSULE | Freq: Every day | ORAL | Status: DC
  Administered 2013-07-24 – 2013-07-28 (×4): 1 mg via ORAL
  Filled 2013-07-24 (×9): qty 1

## 2013-07-24 MED ORDER — THIAMINE HCL 100 MG/ML IJ SOLN: 100.0000 mg | Freq: Once | INTRAMUSCULAR | Status: DC

## 2013-07-24 MED ORDER — ONDANSETRON 4 MG PO TBDP
4.0000 mg | ORAL_TABLET | Freq: Four times a day (QID) | ORAL | Status: AC | PRN
  Administered 2013-07-27: 4 mg via ORAL
  Filled 2013-07-24: qty 1

## 2013-07-24 MED ORDER — VITAMIN B-1 100 MG PO TABS
100.0000 mg | ORAL_TABLET | Freq: Every day | ORAL | Status: DC
  Administered 2013-07-25 – 2013-07-29 (×5): 100 mg via ORAL
  Filled 2013-07-24 (×7): qty 1

## 2013-07-24 MED ORDER — CHLORDIAZEPOXIDE HCL 25 MG PO CAPS
25.0000 mg | ORAL_CAPSULE | ORAL | Status: AC
  Administered 2013-07-27 – 2013-07-28 (×2): 25 mg via ORAL
  Filled 2013-07-24 (×2): qty 1

## 2013-07-24 MED ORDER — CHLORDIAZEPOXIDE HCL 25 MG PO CAPS
25.0000 mg | ORAL_CAPSULE | Freq: Every day | ORAL | Status: AC
  Administered 2013-07-29: 25 mg via ORAL
  Filled 2013-07-24: qty 1

## 2013-07-24 MED ORDER — ALUM & MAG HYDROXIDE-SIMETH 200-200-20 MG/5ML PO SUSP
30.0000 mL | ORAL | Status: DC | PRN
  Administered 2013-07-28 – 2013-07-29 (×2): 30 mL via ORAL

## 2013-07-24 MED ORDER — ACETAMINOPHEN 325 MG PO TABS
650.0000 mg | ORAL_TABLET | Freq: Four times a day (QID) | ORAL | Status: DC | PRN
  Administered 2013-07-25 (×2): 650 mg via ORAL
  Filled 2013-07-24 (×2): qty 2

## 2013-07-24 MED ORDER — ACETAMINOPHEN 325 MG PO TABS: 650.0000 mg | ORAL_TABLET | ORAL | Status: DC | PRN

## 2013-07-24 MED ORDER — NICOTINE 21 MG/24HR TD PT24
21.0000 mg | MEDICATED_PATCH | Freq: Every day | TRANSDERMAL | Status: DC
  Administered 2013-07-24: 21 mg via TRANSDERMAL
  Filled 2013-07-24: qty 1

## 2013-07-24 MED ORDER — MAGNESIUM HYDROXIDE 400 MG/5ML PO SUSP: 30.0000 mL | Freq: Every day | ORAL | Status: DC | PRN

## 2013-07-24 MED ORDER — LORAZEPAM 1 MG PO TABS
0.0000 mg | ORAL_TABLET | Freq: Four times a day (QID) | ORAL | Status: DC
  Administered 2013-07-24: 1 mg via ORAL
  Filled 2013-07-24: qty 1

## 2013-07-24 MED ORDER — CHLORDIAZEPOXIDE HCL 25 MG PO CAPS
25.0000 mg | ORAL_CAPSULE | Freq: Four times a day (QID) | ORAL | Status: AC
  Administered 2013-07-24 – 2013-07-26 (×6): 25 mg via ORAL
  Filled 2013-07-24 (×6): qty 1

## 2013-07-24 MED ORDER — ONDANSETRON HCL 4 MG PO TABS
4.0000 mg | ORAL_TABLET | Freq: Three times a day (TID) | ORAL | Status: DC | PRN
  Filled 2013-07-24: qty 1

## 2013-07-24 MED ORDER — TRAZODONE HCL 50 MG PO TABS
50.0000 mg | ORAL_TABLET | Freq: Every evening | ORAL | Status: DC | PRN
  Administered 2013-07-24 – 2013-07-27 (×3): 50 mg via ORAL
  Filled 2013-07-24 (×12): qty 1

## 2013-07-24 MED ORDER — LOPERAMIDE HCL 2 MG PO CAPS: 2.0000 mg | ORAL_CAPSULE | ORAL | Status: AC | PRN

## 2013-07-24 MED ORDER — LORAZEPAM 1 MG PO TABS
0.0000 mg | ORAL_TABLET | Freq: Two times a day (BID) | ORAL | Status: DC
Start: 2013-07-26 — End: 2013-07-24

## 2013-07-24 NOTE — ED Notes (Signed)
PT'S Lonnie [name], Lonnie [name], [phone] CALLED AND ADVISED PT IS NOT ALLOWED TO RETURN TO HER AND HER AUNT'S HOUSE. ADVISED TO PLEASE CONTACT HER OR AUNT, Lonnie [name], [phone].

## 2013-07-24 NOTE — ED Provider Notes (Signed)
CSN: [id number]     Arrival date & time 07/24/13  [id number] History   First MD Initiated Contact with Patient 07/24/13 1119     Chief Complaint  Patient presents with  . Medical Clearance  . Alcohol Problem  . Addiction Problem     (Consider location/radiation/quality/duration/timing/severity/associated sxs/prior Treatment) Patient is a 48 y.o. male presenting with alcohol problem. The history is provided by the patient. No language interpreter was used.  Alcohol Problem Pertinent negatives include no chills or fever. Associated symptoms comments: He presents with complaint of alcohol and drug dependence causing severe depression and thoughts of suicide. No attempt today but he has a history of self harm. No formed plan of sucide currently. .    Past Medical History  Diagnosis Date  . GERD (gastroesophageal reflux disease)    Past Surgical History  Procedure Laterality Date  . Back surgery    . Neck surgery     Family History  Problem Relation Age of Onset  . Diabetes Mother   . Hypertension Mother   . Hyperlipidemia Father   . Heart attack Neg Hx   . Sudden death Neg Hx    History  Substance Use Topics  . Smoking status: Current Every Day Smoker -- 15 years    Types: Cigarettes    Last Attempt to Quit: 08/08/2012  . Smokeless tobacco: Never Used  . Alcohol Use: Yes    Review of Systems  Constitutional: Negative for fever and chills.  HENT: Negative.   Respiratory: Negative.   Cardiovascular: Negative.   Gastrointestinal: Negative.   Musculoskeletal: Negative.   Skin: Negative.   Neurological: Negative.   Psychiatric/Behavioral: Positive for suicidal ideas and dysphoric mood.      Allergies  Ibuprofen  Home Medications   Prior to Admission medications   Medication Sig Start Date End Date Taking? Authorizing Provider  FLUoxetine (PROZAC) 40 MG capsule Take 40 mg by mouth daily.  05/23/13 05/23/14 Yes Historical Provider, MD  prazosin (MINIPRESS) 1 MG capsule  Take 1 mg by mouth at bedtime.  05/23/13 05/23/14 Yes Historical Provider, MD  risperidone (RISPERDAL) 4 MG tablet Take 4 mg by mouth daily.   Yes Historical Provider, MD   BP 123/65  Pulse 94  Temp(Src) 98.6 F (37 C) (Oral)  Resp 18  Ht 5\' 11"  (1.803 m)  Wt 190 lb (86.183 kg)  BMI 26.51 kg/m2  SpO2 96% Physical Exam  Constitutional: He appears well-developed and well-nourished.  HENT:  Head: Normocephalic.  Neck: Normal range of motion. Neck supple.  Cardiovascular: Normal rate and regular rhythm.   Pulmonary/Chest: Effort normal and breath sounds normal.  Abdominal: Soft. Bowel sounds are normal. There is no tenderness. There is no rebound and no guarding.  Musculoskeletal: Normal range of motion.  Neurological: He is alert. No cranial nerve deficit.  Skin: Skin is warm and dry. No rash noted.  Psychiatric: His speech is normal. He is withdrawn. He exhibits a depressed mood. He expresses suicidal ideation. He expresses no suicidal plans.    ED Course  Procedures (including critical care time) Labs Review Labs Reviewed  COMPREHENSIVE METABOLIC PANEL - Abnormal; Notable for the following:    GFR calc non Af Amer 78 (*)    All other components within normal limits  ETHANOL - Abnormal; Notable for the following:    Alcohol, Ethyl (B) 17 (*)    All other components within normal limits  SALICYLATE LEVEL - Abnormal; Notable for the following:    Salicylate Lvl <  2.0 (*)    All other components within normal limits  URINE RAPID DRUG SCREEN (HOSP PERFORMED) - Abnormal; Notable for the following:    Cocaine POSITIVE (*)    Tetrahydrocannabinol POSITIVE (*)    All other components within normal limits  CBC  ACETAMINOPHEN LEVEL    Imaging Review No results found.   EKG Interpretation None      MDM   Final diagnoses:  None    1. Polysubstance dependence 2. Suicidal ideation  He continues to endorse SI without current plan. Last alcohol use this morning. Will place  on CIWA protocol and request [facility] evaluation for placement/treatment.    Arnoldo HookerShari A Amri Lien, PA-C 07/24/13 1353

## 2013-07-24 NOTE — BH Assessment (Signed)
Tele Assessment Note   Lonnie [name] is an 48 y.o. male that presented to [facility] for detox. Patient reports use of alcohol, crack, THC, and Xanax. Patient started using alcohol at 48 yrs old and has drank a 12 pack of beer daily x9 yrs. His last drink was this morning. Patient uses crack and started use at age 39. He uses 2-grams per use and last use was today. He also uses THC and Xanax and reports somewhat frequent use. He last used THC 3 days ago. He last used xanax 3-4 weeks ago. His withdrawal symptoms include irritability. He has received substance abuse  treatment at [facility] 01/09/2001.   Patient has passive suicidal thoughts and when asked if he has a plan he responds, "If I had to think of one I would overdose". Patient denies hx of suicide attempts/gestures or self harm. Pt sts, "I don't want to die I really want to live but I need help". Patient reports that he is depressed evidenced by loss of interest in usual pleasures, fatigue, and irritability. His stressors include financial, living arrangement, and relationship with mother of children. Sts that the mother of his children does alcohol and drugs and he doesn't want to be around the substance use so he left. Patient also concerned about his health. Patient denies HI. He is calm and cooperative during the tele ssessment. He reports current legal charges for unpaid court fines and has a court date August 03, 2013.   Patient reports auditory hallucinations stating he hears voices calling his name and telling him bad things (mainly at night). Patient also see's shadows of people. He reports that due to the [facility] he has done things out of character (stolen from people, cursed people out, and been mean to people).   Patient has a current outpatient psychiatrist with [facility].  Axis I: Major Depression, Recurrent severe with psychotic features and Polysubstance Abuse Axis II: Deferred Axis III:  Past Medical History  Diagnosis Date  .  GERD (gastroesophageal reflux disease)    Axis IV: economic problems, housing problems, other psychosocial or environmental problems, problems related to legal system/crime, problems related to social environment, problems with access to health care services and problems with primary support group Axis V: 31-40 impairment in reality testing  Past Medical History:  Past Medical History  Diagnosis Date  . GERD (gastroesophageal reflux disease)     Past Surgical History  Procedure Laterality Date  . Back surgery    . Neck surgery      Family History:  Family History  Problem Relation Age of Onset  . Diabetes Mother   . Hypertension Mother   . Hyperlipidemia Father   . Heart attack Neg Hx   . Sudden death Neg Hx     Social History:  reports that he has been smoking Cigarettes.  He has been smoking about 0.00 packs per day for the past 15 years. He has never used smokeless tobacco. He reports that he drinks alcohol. He reports that he uses illicit drugs (Cocaine and Marijuana).  Additional Social History:  Alcohol / Drug Use Pain Medications: SEE MAR Prescriptions: SEE MAR Over the Counter: SEE MAR History of alcohol / drug use?: Yes Negative Consequences of Use: Financial;Personal relationships Withdrawal Symptoms: Agitation;Irritability Substance #1 Name of Substance 1: Alcohol  1 - Age of First Use: 48 yrs old  1 - Amount (size/oz): 12 pack of beer per day 1 - Frequency: daily  1 - Duration: 9 yrs  daily  1 - Last Use / Amount: 07/24/2013 Substance #2 Name of Substance 2: Crack Cocaine  2 - Age of First Use: 48 yrs old  2 - Amount (size/oz): 2grams-3 grams 2 - Frequency: daily  2 - Duration: ongoing  2 - Last Use / Amount: 07/24/2013 Substance #3 Name of Substance 3: THC 3 - Age of First Use: 48 yrs old  3 - Amount (size/oz): 2-3x's per week  3 - Frequency: 2-3xs/week  3 - Duration: ongoing  3 - Last Use / Amount: 3 days ago Substance #4 Name of Substance 4:  Xanax 4 - Age of First Use: 48 yrs old  4 - Amount (size/oz): "I use a couple of pills during each use" 4 - Frequency: varies with accessibility  4 - Duration: ongoing  4 - Last Use / Amount: 3-4 weeks ago  CIWA: CIWA-Ar BP: 121/85 mmHg Pulse Rate: 71 Nausea and Vomiting: no nausea and no vomiting Tactile Disturbances: none Tremor: no tremor Auditory Disturbances: not present Paroxysmal Sweats: two Visual Disturbances: not present Anxiety: three Headache, Fullness in Head: none present Agitation: normal activity Orientation and Clouding of Sensorium: oriented and can do serial additions CIWA-Ar Total: 5 COWS:    Allergies:  Allergies  Allergen Reactions  . Ibuprofen Itching    Home Medications:  (Not in a hospital admission)  OB/GYN Status:  No LMP for male patient.  General Assessment Data [facility] Is this a Tele or Face-to-Face Assessment?: Tele Assessment Is this an Initial Assessment or a Re-assessment for this encounter?: Initial Assessment Living Arrangements: Other (Comment);Other relatives (living with aunt) Can pt return to current living arrangement?:  (Pt sts, "I'm not sure if she will let me go back") Admission Status: Voluntary Is patient capable of signing voluntary admission?: Yes Transfer from: Acute Hospital Referral Source: Self/Family/Friend     [facility] Living Arrangements: Other (Comment);Other relatives (living with aunt) Name of Psychiatrist:  Museum/gallery curator([facility]) Name of Therapist:  Vesta Mixer([facility])  Education Status Is patient currently in school?: No  Risk to self Suicidal Ideation: Yes-Currently Present Suicidal Intent: Yes-Currently Present Is patient at risk for suicide?: Yes Suicidal Plan?: Yes-Currently Present ("If I had to think of a plan I would overdose") Specify Current Suicidal Plan:  (overdose on pills) Access to Means: Yes Specify Access to Suicidal Means:  (access to pills ) What has been your use of  drugs/alcohol within the last 12 months?:  (Alcohol, Cocaine, THC, and Benzo's) Previous Attempts/Gestures: No How many times?:  (0) Other Self Harm Risks:  (n/a) Triggers for Past Attempts: Other (Comment) (n/a) Intentional Self Injurious Behavior: None Family Suicide History: Unknown Recent stressful life event(s): Other (Comment);Loss (Comment);Conflict (Comment);Financial Problems;Legal Issues;Turmoil (Comment) (unsure if he can return to living arrangement; relationship ) Persecutory voices/beliefs?: No Depression: Yes Depression Symptoms: Feeling angry/irritable;Feeling worthless/self pity;Loss of interest in usual pleasures;Guilt;Fatigue;Isolating;Tearfulness;Insomnia Substance abuse history and/or treatment for substance abuse?: No Suicide prevention information given to non-admitted patients: Not applicable  Risk to Others Homicidal Ideation: No Thoughts of Harm to Others: No Current Homicidal Intent: No Current Homicidal Plan: No Access to Homicidal Means: No Identified Victim:  (n/a) History of harm to others?: No Assessment of Violence: None Noted Violent Behavior Description:  (patient is calm and cooperative ) Does patient have access to weapons?: No Criminal Charges Pending?: Yes Describe Pending Criminal Charges:  (August 03, 2013 "court fines from prevous case") Does patient have a court date: No (August 03, 2013)  Psychosis Hallucinations:  None noted Delusions: None noted  Mental Status Report Appear/Hygiene: Disheveled Eye Contact: Good Motor Activity: Freedom of movement Speech: Logical/coherent Level of Consciousness: Alert Mood: Depressed Affect: Appropriate to circumstance Anxiety Level: Severe Thought Processes: Coherent Judgement: Impaired Orientation: Person;Place;Time;Situation Obsessive Compulsive Thoughts/Behaviors: None  Cognitive Functioning Concentration: Decreased Memory: Recent Intact;Remote Intact IQ: Average Insight: Poor Impulse  Control: Poor Appetite: Fair Weight Loss:  (none reported ) Weight Gain:  (none reported ) Sleep: Decreased Vegetative Symptoms: None  ADLScreening [facility]) Patient's cognitive ability adequate to safely complete daily activities?: Yes Patient able to express need for assistance with ADLs?: Yes Independently performs ADLs?: Yes (appropriate for developmental age)  Prior Inpatient Therapy Prior Inpatient Therapy: Yes Prior Therapy Dates:  (01/09/2001) Prior Therapy Facilty/Provider(s):  [facility] Reason for Treatment:  ([address] -substance abuse)  Prior Outpatient Therapy Prior Outpatient Therapy: Yes Prior Therapy Dates:  (current) Prior Therapy Facilty/Provider(s):  Museum/gallery curator([facility]) Reason for Treatment:  (med mgmt)  ADL Screening (condition at time of admission) Patient's cognitive ability adequate to safely complete daily activities?: Yes Is the patient deaf or have difficulty hearing?: No Does the patient have difficulty seeing, even when wearing glasses/contacts?: No Does the patient have difficulty concentrating, remembering, or making decisions?: Yes Patient able to express need for assistance with ADLs?: Yes Does the patient have difficulty dressing or bathing?: No Independently performs ADLs?: Yes (appropriate for developmental age) Does the patient have difficulty walking or climbing stairs?: No Weakness of Legs: None Weakness of Arms/Hands: None  Home Assistive Devices/Equipment Home Assistive Devices/Equipment: None    Abuse/Neglect Assessment (Assessment to be complete while patient is alone) Physical Abuse: Denies Verbal Abuse: Denies Sexual Abuse: Denies Exploitation of patient/patient's resources: Denies Self-Neglect: Denies Values / Beliefs Cultural Requests During Hospitalization: None Spiritual Requests During Hospitalization: None   Advance Directives (For Healthcare) Advance Directive: Patient does not have advance directive Nutrition  Screen- [facility] Adult/WL/AP Patient's home diet: Regular  Additional Information 1:1 In Past 12 Months?: No CIRT Risk: No Elopement Risk: No Does patient have medical clearance?: Yes     Disposition:  Disposition Initial Assessment Completed for this Encounter: Yes Disposition of Patient: Inpatient treatment program ([facility] Lonnie Capriceonrad, NP to Lonnie LyonsIrving Lugo, MD [phone])) Type of inpatient treatment program: Adult (Room 301-1)  Lonnie Rippleerry, [name] 07/24/2013 6:51 PM

## 2013-07-24 NOTE — ED Provider Notes (Signed)
Medical screening examination/treatment/procedure(s) were performed by non-physician practitioner and as supervising physician I was immediately available for consultation/collaboration.   EKG Interpretation None       Doug SouSam Donelda Mailhot, MD 07/24/13 1754

## 2013-07-24 NOTE — ED Notes (Addendum)
He states hes here for detox from alcohol and drugs. He states he drinks beer and liquor and uses marijuana, pills, crack/cocaine. He last used substances today. He states he feels "tired and mentally drained right now." He is alert and breathing easily. He reports thoughts of killing himself this morning. He denies HI

## 2013-07-24 NOTE — ED Provider Notes (Signed)
8:09 PM Accepted to [facility] by Dr. Dub MikesLugo.   [facility] LLC[facility]yleForrest S Marelin Tat, MD 07/25/13 [phone]

## 2013-07-25 ENCOUNTER — Encounter (HOSPITAL_COMMUNITY): Payer: Self-pay | Admitting: *Deleted

## 2013-07-25 DIAGNOSIS — F1420 Cocaine dependence, uncomplicated: Secondary | ICD-10-CM | POA: Diagnosis present

## 2013-07-25 DIAGNOSIS — I639 Cerebral infarction, unspecified: Secondary | ICD-10-CM

## 2013-07-25 DIAGNOSIS — R45851 Suicidal ideations: Secondary | ICD-10-CM

## 2013-07-25 DIAGNOSIS — F339 Major depressive disorder, recurrent, unspecified: Secondary | ICD-10-CM | POA: Diagnosis present

## 2013-07-25 DIAGNOSIS — F1020 Alcohol dependence, uncomplicated: Secondary | ICD-10-CM | POA: Diagnosis not present

## 2013-07-25 HISTORY — DX: Cerebral infarction, unspecified: I63.9

## 2013-07-25 MED ORDER — CYCLOBENZAPRINE HCL 10 MG PO TABS
10.0000 mg | ORAL_TABLET | Freq: Three times a day (TID) | ORAL | Status: DC | PRN
  Administered 2013-07-25 – 2013-07-27 (×3): 10 mg via ORAL
  Filled 2013-07-25 (×3): qty 1

## 2013-07-25 MED ORDER — NICOTINE 14 MG/24HR TD PT24
14.0000 mg | MEDICATED_PATCH | Freq: Every day | TRANSDERMAL | Status: DC
  Administered 2013-07-25 – 2013-07-29 (×5): 14 mg via TRANSDERMAL
  Filled 2013-07-25 (×8): qty 1

## 2013-07-25 NOTE — [facility] Suicide Risk Assessment (Signed)
Suicide Risk Assessment  Admission Assessment     Nursing information obtained from:  Patient;Review of record Demographic factors:  Male;Low socioeconomic status;Unemployed Current Mental Status:  Self-harm thoughts Loss Factors:  Loss of significant relationship;Legal issues;Financial problems / change in socioeconomic status Historical Factors:  Family history of mental illness or substance abuse Risk Reduction Factors:  Responsible for children under 47 years of age;Positive social support Total Time spent with patient: 45 minutes  CLINICAL FACTORS:   Depression:   Comorbid alcohol abuse/dependence Alcohol/Substance Abuse/Dependencies  PsyCOGNITIVE FEATURES THAT CONTRIBUTE TO RISK:  Closed-mindedness Polarized thinking Thought constriction (tunnel vision)    SUICIDE RISK:   Moderate:  Frequent suicidal ideation with limited intensity, and duration, some specificity in terms of plans, no associated intent, good self-control, limited dysphoria/symptomatology, some risk factors present, and identifiable protective factors, including available and accessible social support.  PLAN OF CARE: Supportive approach/coping skills/relapse prevention                               Librium detox/reassess and address the co morbities  I certify that inpatient services furnished can reasonably be expected to improve the patient's condition.  Maxene Byington A 07/25/2013, 12:48 PM

## 2013-07-25 NOTE — Progress Notes (Signed)
Patient ID: Lonnie JunesHoward J Rochefort, male   DOB: 11/19/1965, 48 y.o.   MRN: [id number] He has been up and about and to groups interacting with peers and staff. Self inventory: depression 3, hopelessness 4, withdrawals of craving and agitation,denies SI thoughts. Physical pain headache. Refused offer of tylenol. He denies SI thoughts.

## 2013-07-25 NOTE — Progress Notes (Signed)
Pt has been in the bed sleeping since change of shift.  Pt did not attend evening group, reporting to MHT that he had severe back pain.  When writer checked on pt, he was resting peacefully with eyes closed.  Pt is up for snack time and is in the dayroom talking with peers.  Pt reports he is having mild withdrawal symptoms, but his main complaint is his back pain.  Pt denies SI/HI/AV at this time.  Pt makes his needs known to staff.  Writer informed PA of pt's back pain and received orders.  Pt given Flexeril 10mg  and offered a heat pack.  Support and encouragement offered.  Safety maintained with q15 minute checks.

## 2013-07-25 NOTE — [facility] Group Notes (Signed)
Adult Psychoeducational Group Note  Date:  07/25/2013 Time:  10:22 PM  Group Topic/Focus:  Personal Choices and Values:   The focus of this group is to help patients assess and explore the importance of values in their lives, how their values affect their decisions, how they express their values and what opposes their expression.  Participation Level:  Did Not Attend  Additional Comments:Pt refused to come to group stating "my back pain is too bad to get out of bed."  Ledora BottcherHolcomb, Juvia Aerts G 07/25/2013, 10:22 PM
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
## 2013-07-25 NOTE — [facility] Group Notes (Signed)
[facility] Note   07/25/2013 11:02 AM  Participation Quality:  DID NOT ATTEND-pt in room resting during d/c planning group.   Smart, American FinancialHeather LCSWA
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
## 2013-07-25 NOTE — [facility] Group Notes (Signed)
[facility] LCSW Group Therapy  07/25/2013 3:08 PM  Type of Therapy:  Group Therapy  Participation Level:  Minimal  Participation Quality:  Attentive  Affect:  Depressed and Flat  Cognitive:  Oriented  Insight:  Improving  Engagement in Therapy:  Limited  Modes of Intervention:  Confrontation, Discussion, Education, Exploration, Problem-solving, Rapport Building, Socialization and Support  Summary of Progress/Problems: Emotion Regulation: This group focused on both positive and negative emotion identification and allowed group members to process ways to identify feelings, regulate negative emotions, and find healthy ways to manage internal/external emotions. Group members were asked to reflect on a time when their reaction to an emotion led to a negative outcome and explored how alternative responses using emotion regulation would have benefited them. Group members were also asked to discuss a time when emotion regulation was utilized when a negative emotion was experienced. Lonnie [name] was attentive during today's therapy group but did not actively participate in group discussion unless asked a direct question by CSW. Pt stated that he struggles with anger and was able to identify what anger feels like to him physically "I get hot, sweaty, and lose my ability to think clearly." Lonnie [name] shows some progress in the group setting and improving insight AEB his ability to process how "taking time to reflect my relationships, seeking inpatient treatment to get my addiction under control, and talking things out with a Child psychotherapistsocial worker or therapist" will help him learn how to regulate anger and other negative emotions in a healthier way.    Smart, Lonnie [name] LCSWA  07/25/2013, 3:08 PM
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
## 2013-07-25 NOTE — Tx Team (Signed)
Initial Interdisciplinary Treatment Plan  PATIENT STRENGTHS: (choose at least two) Average or above average intelligence Capable of independent living General fund of knowledge Supportive family/friends  PATIENT STRESSORS: Financial difficulties Legal issue Marital or family conflict Substance abuse   PROBLEM LIST: Problem List/Patient Goals Date to be addressed Date deferred Reason deferred Estimated date of resolution  SA- pt wants help with detox from alcohol and crack cocaine- pt wants to go to rehab after detox      Conflict with mother of his children      Risk for self harm      Depression                                     DISCHARGE CRITERIA:  Adequate post-discharge living arrangements Improved stabilization in mood, thinking, and/or behavior Motivation to continue treatment in a less acute level of care Verbal commitment to aftercare and medication compliance Withdrawal symptoms are absent or subacute and managed without 24-hour nursing intervention  PRELIMINARY DISCHARGE PLAN: Outpatient therapy Placement in alternative living arrangements  PATIENT/FAMIILY INVOLVEMENT: This treatment plan has been presented to and reviewed with the patient, Lonnie [name], and/or family member.  The patient and family have been given the opportunity to ask questions and make suggestions.  Jesus GeneraSpeagle, Khadeejah Castner [facility] 07/25/2013, 12:48 AM

## 2013-07-26 DIAGNOSIS — F1410 Cocaine abuse, uncomplicated: Secondary | ICD-10-CM

## 2013-07-26 DIAGNOSIS — F1994 Other psychoactive substance use, unspecified with psychoactive substance-induced mood disorder: Secondary | ICD-10-CM

## 2013-07-26 DIAGNOSIS — F1020 Alcohol dependence, uncomplicated: Principal | ICD-10-CM

## 2013-07-26 MED ORDER — HYDROCODONE-ACETAMINOPHEN 5-325 MG PO TABS
2.0000 | ORAL_TABLET | Freq: Four times a day (QID) | ORAL | Status: DC | PRN
  Administered 2013-07-26 – 2013-07-27 (×2): 2 via ORAL
  Filled 2013-07-26 (×2): qty 2

## 2013-07-26 MED ORDER — LIDOCAINE 5 % EX PTCH
1.0000 | MEDICATED_PATCH | CUTANEOUS | Status: DC
  Administered 2013-07-26 – 2013-07-29 (×4): 1 via TRANSDERMAL
  Filled 2013-07-26 (×6): qty 1

## 2013-07-26 NOTE — Progress Notes (Signed)
D   Pt kept to himself this evening  He did not come to get his medications at bedtime   He appears sad depressed and anxious A   Verbal support given   Medications administered and effectiveness monitored   Q 15 min checks R   Pt safe at present

## 2013-07-26 NOTE — Progress Notes (Signed)
[facility]  07/26/2013 4:41 PM Apolinar JunesHoward J Willemsen  MRN:  [id number] Subjective:  Dimas AguasHoward stated he was having a very hard time with his pain. Could not sleep last night. States he is given Vicodin vs. putting a needle on his back. States he has not been able to go to the groups or do anything else due to the pain Diagnosis:   DSM5: Schizophrenia Disorders:  none Obsessive-Compulsive Disorders:  none Trauma-Stressor Disorders:  none Substance/Addictive Disorders:  Alcohol Related Disorder - Severe (303.90), Cocaine related disorder Depressive Disorders:  Major Depressive Disorder - Moderate (296.22) Total Time spent with patient: 30 minutes  Axis I: Substance Induced Mood Disorder  ADL's:  Intact  Sleep: Poor  Appetite:  Poor  Suicidal Ideation:  Plan:  denies Intent:  denies Means:  denies Homicidal Ideation:  Plan:  denies Intent:  denies Means:  denies AEB (as evidenced by):  Psychiatric Specialty Exam: Physical Exam  Review of Systems  Constitutional: Positive for malaise/fatigue.  Eyes: Negative.   Respiratory: Negative.   Cardiovascular: Negative.   Gastrointestinal: Negative.   Genitourinary: Negative.   Musculoskeletal: Positive for back pain, joint pain and neck pain.  Skin: Negative.   Neurological: Positive for weakness.  Endo/Heme/Allergies: Negative.   Psychiatric/Behavioral: Positive for depression and substance abuse. The patient has insomnia.     Blood pressure 94/69, pulse 101, temperature 97.5 F (36.4 C), temperature source Oral, resp. rate 16, height 5\' 10"  (1.778 m), weight 83.915 kg (185 lb).Body mass index is 26.54 kg/(m^2).  General Appearance: Disheveled  Eye Contact::  Minimal  Speech:  Clear and Coherent and not spontaneous  Volume:  Decreased  Mood:  Anxious, Depressed and in pain  Affect:  Tearful and in pain  Thought Process:  Coherent and Goal Directed  Orientation:  Full (Time, Place, and Person)  Thought Content:  symptoms  worries concerns  Suicidal Thoughts:  No  Homicidal Thoughts:  No  Memory:  Immediate;   Fair Recent;   Fair Remote;   Fair  Judgement:  Fair  Insight:  Present and Shallow  Psychomotor Activity:  Restlessness  Concentration:  Fair  Recall:  FiservFair  Fund of Knowledge:NA  Language: Fair  Akathisia:  No  Handed:    AIMS (if indicated):     Assets:  Desire for Improvement  Sleep:  Number of Hours: 4.5   Musculoskeletal: Strength & Muscle Tone: decreased Gait & Station: unsteady Patient leans: N/A  Current Medications: Current Facility-Administered Medications  Medication Dose Route Frequency Provider Last Rate Last Dose  . acetaminophen (TYLENOL) tablet 650 mg  650 mg Oral Q6H PRN Kerry HoughSpencer E Simon, PA-C   650 mg at 07/25/13 1827  . alum & mag hydroxide-simeth (MAALOX/MYLANTA) 200-200-20 MG/5ML suspension 30 mL  30 mL Oral Q4H PRN Kerry HoughSpencer E Simon, PA-C      . chlordiazePOXIDE (LIBRIUM) capsule 25 mg  25 mg Oral Q6H PRN Kerry HoughSpencer E Simon, PA-C      . chlordiazePOXIDE (LIBRIUM) capsule 25 mg  25 mg Oral TID Kerry HoughSpencer E Simon, PA-C   25 mg at 07/26/13 1304   Followed by  . [START ON 07/27/2013] chlordiazePOXIDE (LIBRIUM) capsule 25 mg  25 mg Oral BH-qamhs Spencer E Simon, PA-C       Followed by  . [START ON 07/29/2013] chlordiazePOXIDE (LIBRIUM) capsule 25 mg  25 mg Oral Daily Kerry HoughSpencer E Simon, PA-C      . cyclobenzaprine (FLEXERIL) tablet 10 mg  10 mg Oral TID PRN Mena GoesSpencer E  Simon, PA-C   10 mg at 07/26/13 0940  . HYDROcodone-acetaminophen (NORCO/VICODIN) 5-325 MG per tablet 2 tablet  2 tablet Oral Q6H PRN Rachael FeeIrving A Daley Mooradian, MD   2 tablet at 07/26/13 1110  . hydrOXYzine (ATARAX/VISTARIL) tablet 25 mg  25 mg Oral Q6H PRN Kerry HoughSpencer E Simon, PA-C   25 mg at 07/25/13 0447  . lidocaine (LIDODERM) 5 % 1 patch  1 patch Transdermal Q24H Rachael FeeIrving A Sandro Burgo, MD   1 patch at 07/26/13 1110  . loperamide (IMODIUM) capsule 2-4 mg  2-4 mg Oral PRN Kerry HoughSpencer E Simon, PA-C      . magnesium hydroxide (MILK OF MAGNESIA)  suspension 30 mL  30 mL Oral Daily PRN Kerry HoughSpencer E Simon, PA-C      . multivitamin with minerals tablet 1 tablet  1 tablet Oral Daily Kerry HoughSpencer E Simon, PA-C   1 tablet at 07/26/13 0939  . nicotine (NICODERM CQ - dosed in mg/24 hours) patch 14 mg  14 mg Transdermal Daily Kerry HoughSpencer E Simon, PA-C   14 mg at 07/26/13 [id number]  . ondansetron (ZOFRAN-ODT) disintegrating tablet 4 mg  4 mg Oral Q6H PRN Kerry HoughSpencer E Simon, PA-C      . prazosin (MINIPRESS) capsule 1 mg  1 mg Oral QHS Kerry HoughSpencer E Simon, PA-C   1 mg at 07/25/13 2156  . thiamine (B-1) injection 100 mg  100 mg Intramuscular Once IntelSpencer E Simon, PA-C      . thiamine (VITAMIN B-1) tablet 100 mg  100 mg Oral Daily Kerry HoughSpencer E Simon, PA-C   100 mg at 07/26/13 [id number]  . traZODone (DESYREL) tablet 50 mg  50 mg Oral QHS,MR X 1 Kerry HoughSpencer E Simon, PA-C   50 mg at 07/25/13 2156    Lab Results: No results found for this or any previous visit (from the past 48 hour(s)).  Physical Findings: AIMS: Facial and Oral Movements Muscles of Facial Expression: None, normal Lips and Perioral Area: None, normal Jaw: None, normal Tongue: None, normal,Extremity Movements Upper (arms, wrists, hands, fingers): None, normal Lower (legs, knees, ankles, toes): None, normal, Trunk Movements Neck, shoulders, hips: None, normal, Overall Severity Severity of abnormal movements (highest score from questions above): None, normal Incapacitation due to abnormal movements: None, normal Patient's awareness of abnormal movements (rate only patient's report): No Awareness, Dental Status Current problems with teeth and/or dentures?: No Does patient usually wear dentures?: No  CIWA:  CIWA-Ar Total: 1 COWS:     Treatment Plan Summary: Daily contact with patient to assess and evaluate symptoms and progress in treatment Medication management  Plan: Supportive approach/coping skills/relapse prevention           Address the pain: Vicodin 5/325 two Q 6 Hrs Prn for 48 hours           Lidoderm patch  to back           [facility]  Medical Decision Making Problem Points:  Established problem, worsening (2) and Review of psycho-social stressors (1) Data Points:  Review of medication regiment & side effects (2) Review of new medications or change in dosage (2)  I certify that inpatient services furnished can reasonably be expected to improve the patient's condition.   Avey Mcmanamon A 07/26/2013, 4:41 PM

## 2013-07-26 NOTE — [facility] Group Notes (Signed)
0900 nursing orientation group    The focus of this group is to educate the patient on the purpose and policies of crisis stabilization and provide a format to answer questions about their admission.  The group details unit policies and expectations of patients while admitted.  Pt did not attend he was lying in his bed

## 2013-07-26 NOTE — Progress Notes (Signed)
Adult Psychoeducational Group Note  Date:  07/26/2013 Time:  10:23 PM  Group Topic/Focus:  Wrap-Up Group:   The focus of this group is to help patients review their daily goal of treatment and discuss progress on daily workbooks.  Participation Level:  Active  Participation Quality:  Appropriate  Affect:  Appropriate  Cognitive:  Alert  Insight: Appropriate  Engagement in Group:  Engaged  Modes of Intervention:  Support  Additional Comments:  PT came to group late because he was sleep, but pt decide to sing a song while he was in group  Daune Colgate R 07/26/2013, 10:23 PM

## 2013-07-26 NOTE — Progress Notes (Signed)
Pt's mother Mora BellmanRuby came to the hospital and reported to writer that pt missed an injection that he was suppose to receive from [facility] a couple days ago. The pt's mother was unable to locate the pt at that time and she could not recall the name of the medication. She left a card and envelop with the number and reports that the pharmacy closes by 5:00. Discussed information with NP and called the number listed on the card [phone]. Left a message for a return call. Waiting for return call.

## 2013-07-26 NOTE — Progress Notes (Signed)
Recreation Therapy Notes  Animal-Assisted Activity/Therapy (AAA/T) Program Checklist/Progress Notes Patient Eligibility Criteria Checklist & Daily Group note for Rec Tx Intervention  Date: 07.02.2015 Time: 2:45pm [address]   AAA/T Program Assumption of Risk Form signed by Patient/ or Parent Legal Guardian yes  Patient is free of allergies or sever asthma yes  Patient reports no fear of animals yes  Patient reports no history of cruelty to animals yes   Patient understands his/her participation is voluntary yes  Behavioral Response: Did not attend.   Marykay Lexenise L Brinly Maietta, LRT/CTRS  Deriyah Kunath L 07/26/2013 4:07 PM

## 2013-07-26 NOTE — Progress Notes (Signed)
Marcelino DusterMichelle from the pharmacy at [facility] called and Web designerinformed writer that pt has medications to pick up that include Prazosin 1mg  at HS, Trazadone 100mg  2 at HS, Risperadone 4mg  at HS, Fluoxetine 40mg  daily, and  Risperdal consta 25mg  injection q 2 weeks. Pt last received the injection on June15th and was suppose to receive another injection on June 30th and did not show up.

## 2013-07-26 NOTE — [facility] Group Notes (Signed)
[facility] LCSW Group Therapy  07/26/2013 3:09 PM  Type of Therapy:  Group Therapy  Participation Level:  Minimal  Participation Quality:  Drowsy  Affect:  Depressed, Flat and Lethargic  Cognitive:  Lacking  Insight:  Limited  Engagement in Therapy:  Limited  Modes of Intervention:  Confrontation, Discussion, Education, Exploration, Problem-solving, Rapport Building, Socialization and Support  Summary of Progress/Problems:  Finding Balance in Life. Today's group focused on defining balance in one's own words, identifying things that can knock one off balance, and exploring healthy ways to maintain balance in life. Group members were asked to provide an example of a time when they felt off balance, describe how they handled that situation,and process healthier ways to regain balance in the future. Group members were asked to share the most important tool for maintaining balance that they [facility] and how they plan to apply this method after discharge. Dimas AguasHoward shared that living a balanced life involves being in a healthy relationship, being sober, and finding a sense of purpose. He talked about the unhealthy relationship that he is currently in. Dimas AguasHoward shows some progress in the group setting with limited insight AEB his difficulty in identifying other issues in terms of life imbalance and linking this to his substance abuse. Dimas AguasHoward was lethargic during group and had difficulty remaining awake but was able to talk about his goal of going to inpatient treatment in order to learn more coping skills.    Smart, Mikhala Kenan LCSWA  07/26/2013, 3:09 PM

## 2013-07-27 MED ORDER — CYCLOBENZAPRINE HCL 10 MG PO TABS
10.0000 mg | ORAL_TABLET | Freq: Three times a day (TID) | ORAL | Status: DC
  Administered 2013-07-27 – 2013-07-29 (×8): 10 mg via ORAL
  Filled 2013-07-27 (×13): qty 1

## 2013-07-27 MED ORDER — DOXYCYCLINE HYCLATE 100 MG PO TABS
100.0000 mg | ORAL_TABLET | Freq: Two times a day (BID) | ORAL | Status: DC
  Administered 2013-07-27 – 2013-07-29 (×6): 100 mg via ORAL
  Filled 2013-07-27 (×10): qty 1

## 2013-07-27 MED ORDER — HYDROCODONE-ACETAMINOPHEN 5-325 MG PO TABS
1.0000 | ORAL_TABLET | Freq: Four times a day (QID) | ORAL | Status: DC | PRN
  Administered 2013-07-27 – 2013-07-29 (×7): 1 via ORAL
  Filled 2013-07-27 (×8): qty 1

## 2013-07-27 MED ORDER — FLUOXETINE HCL 20 MG PO CAPS
20.0000 mg | ORAL_CAPSULE | Freq: Every day | ORAL | Status: DC
  Administered 2013-07-27 – 2013-07-29 (×3): 20 mg via ORAL
  Filled 2013-07-27 (×5): qty 1

## 2013-07-27 MED ORDER — RISPERIDONE 2 MG PO TABS
2.0000 mg | ORAL_TABLET | Freq: Every day | ORAL | Status: DC
  Administered 2013-07-27: 2 mg via ORAL
  Filled 2013-07-27 (×4): qty 1

## 2013-07-27 MED ORDER — MUPIROCIN CALCIUM 2 % EX CREA
TOPICAL_CREAM | Freq: Two times a day (BID) | CUTANEOUS | Status: DC
  Administered 2013-07-27 – 2013-07-29 (×4): via TOPICAL
  Filled 2013-07-27: qty 15

## 2013-07-27 NOTE — Progress Notes (Signed)
D:Pt reports that he did not sleep well and removed his nicotine patch last night because of nightmares. Pt reports that he is feeling agitated towards his baby's mama. He denies pain at this time. He denies any detox symptoms at this time. A:Supported pt to discuss feelings. Gave medications as ordered and 15 minute checks.  R:Pt denies si and hi. He denies hallucinations. Safety maintained on the unit.

## 2013-07-27 NOTE — [facility] Group Notes (Signed)
Adult Psychoeducational Group Note  Date:  07/27/2013 Time:  2:08 PM  Group Topic/Focus:  Healthy Communication:   The focus of this group is to discuss communication, barriers to communication, as well as healthy ways to communicate with others.  Participation Level:  Minimal  Participation Quality:  Drowsy  Affect:  Drowsy   Cognitive:  Oriented  Insight: Good  Engagement in Group:  Minimal  Modes of Intervention:  Discussion, Education and Socialization  Additional Comments:  During group he was very sleepy, therefore he did not participate.   Ronelle NighCook, Georgina Krist D 07/27/2013, 2:08 PM

## 2013-07-27 NOTE — Progress Notes (Signed)
[facility] :  Will you be returning to the same living situation after discharge: No. [facility] admission scheduled for Monday 7/6 at 8AM At discharge, do you have transportation home?:Yes,  aunt coming at 7:15 on Monday 7/6 to transport [facility] to [facility]  Do you have the ability to pay for your medications:Yes,  mental health   Release of information consent forms completed and submitted to to Medical Records by CSW.  Patient to Follow up at: Follow-up Information   [facility] (Arrive by 8am for screening and admission. Make sure to bring photo ID, medications, and clothing. )    Contact information:   [address] [place], [place] [address] Phone: [phone] Fax: [phone]      [facility]. (Walk in between 8am-9am Monday through Friday for hospital follow-up/medication management/assessment for therapy services. )    Contact information:   [address] [place], [place] [address] Phone: [phone] Fax: [phone]      Patient denies SI/HI:   Yes,  during group/self report.     Safety Planning and Suicide Prevention discussed:  Yes,  SPE completed with pt's aunt. SPI pamphlet provided to pt and he was encouraged to share information with support network, ask questions, and talk about any concerns relating to SPE.  Smart, Ellora Varnum LCSWA  07/27/2013, 12:57 PM

## 2013-07-27 NOTE — Progress Notes (Signed)
[facility]  07/27/2013 5:17 PM Lonnie [name]  MRN:  [id number] Subjective:  Lonnie [name] states that the pain is better. He has been off the Risperdal Consta shot but he is still taking the Risperdal PO. States that when he takes his medication he does much better in terms of the voices. 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  HENT: Negative.   Eyes: Negative.   Respiratory: Negative.   Cardiovascular: Negative.   Gastrointestinal: Negative.   Genitourinary: Negative.   Musculoskeletal: Positive for back pain and joint pain.  Skin: Negative.   Neurological: Positive for weakness.  Endo/Heme/Allergies: Negative.   Psychiatric/Behavioral: Positive for depression and substance abuse. The patient is nervous/anxious.     Blood pressure 120/83, pulse 78, temperature 97.4 F (36.3 C), temperature source Oral, resp. rate 20, height 5\' 10"  (1.778 m), weight 83.915 kg (185 lb), SpO2 98.00%.Body mass index is 26.54 kg/(m^2).  General Appearance: Fairly Groomed  Patent attorneyye Contact::  Fair  Speech:  Clear and Coherent, Slow and not spontaneous  Volume:  Decreased  Mood:  Anxious and worried  Affect:  Restricted  Thought Process:  Coherent and Goal Directed  Orientation:  Full (Time, Place,  and Person)  Thought Content:  symtpoms worries concerns  Suicidal Thoughts:  No  Homicidal Thoughts:  No  Memory:  Immediate;   Fair Recent;   Fair Remote;   Fair  Judgement:  Fair  Insight:  Present and Shallow  Psychomotor Activity:  Restlessness  Concentration:  Fair  Recall:  FiservFair  Fund of Knowledge:NA  Language: Fair  Akathisia:  No  Handed:    AIMS (if indicated):     Assets:  Desire for Improvement  Sleep:  Number of Hours: 4.5   Musculoskeletal: Strength & Muscle Tone: within normal limits Gait & Station: normal Patient leans: N/A  Current Medications: Current Facility-Administered Medications  Medication Dose Route Frequency Provider Last Rate Last Dose  . acetaminophen (TYLENOL) tablet 650 mg  650 mg Oral Q6H PRN Kerry HoughSpencer E Simon, PA-C   650 mg at 07/25/13 1827  . alum & mag hydroxide-simeth (MAALOX/MYLANTA) 200-200-20 MG/5ML suspension 30 mL  30 mL Oral Q4H PRN Kerry HoughSpencer E Simon, PA-C      . chlordiazePOXIDE (LIBRIUM) capsule 25 mg  25 mg Oral Q6H PRN Kerry HoughSpencer E Simon, PA-C      . chlordiazePOXIDE (LIBRIUM) capsule 25 mg  25 mg Oral BH-qamhs Spencer E Simon, PA-C       Followed by  . [START ON 07/29/2013] chlordiazePOXIDE (LIBRIUM) capsule 25 mg  25 mg Oral Daily Kerry HoughSpencer E Simon, PA-C      . cyclobenzaprine (FLEXERIL) tablet 10 mg  10 mg Oral TID Rachael FeeIrving A Beauty Pless, MD   10 mg at 07/27/13 1635  . doxycycline (VIBRA-TABS) tablet 100 mg  100 mg Oral Q12H Rachael FeeIrving A Tonnie Friedel, MD   100 mg  at 07/27/13 1348  . FLUoxetine (PROZAC) capsule 20 mg  20 mg Oral Daily Rachael FeeIrving A Phoenyx Paulsen, MD   20 mg at 07/27/13 1348  . HYDROcodone-acetaminophen (NORCO/VICODIN) 5-325 MG per tablet 1 tablet  1 tablet Oral Q6H PRN Rachael FeeIrving A Buffi Ewton, MD   1 tablet at 07/27/13 1134  . hydrOXYzine (ATARAX/VISTARIL) tablet 25 mg  25 mg Oral Q6H PRN Kerry HoughSpencer E Simon, PA-C   25 mg at 07/25/13 0447  . lidocaine (LIDODERM) 5 % 1 patch  1 patch Transdermal Q24H Rachael FeeIrving A Larone Kliethermes, MD   1 patch at 07/27/13 1134  . loperamide (IMODIUM)  capsule 2-4 mg  2-4 mg Oral PRN Kerry HoughSpencer E Simon, PA-C      . magnesium hydroxide (MILK OF MAGNESIA) suspension 30 mL  30 mL Oral Daily PRN Kerry HoughSpencer E Simon, PA-C      . multivitamin with minerals tablet 1 tablet  1 tablet Oral Daily Kerry HoughSpencer E Simon, PA-C   1 tablet at 07/27/13 0803  . mupirocin cream (BACTROBAN) 2 %   Topical BID Rachael FeeIrving A Ogechi Kuehnel, MD      . nicotine (NICODERM CQ - dosed in mg/24 hours) patch 14 mg  14 mg Transdermal Daily Kerry HoughSpencer E Simon, PA-C   14 mg at 07/27/13 0804  . ondansetron (ZOFRAN-ODT) disintegrating tablet 4 mg  4 mg Oral Q6H PRN Kerry HoughSpencer E Simon, PA-C      . prazosin (MINIPRESS) capsule 1 mg  1 mg Oral QHS Kerry HoughSpencer E Simon, PA-C   1 mg at 07/25/13 2156  . risperiDONE (RISPERDAL) tablet 2 mg  2 mg Oral QHS Rachael FeeIrving A Macall Mccroskey, MD      . thiamine (B-1) injection 100 mg  100 mg Intramuscular Once Kerry HoughSpencer E Simon, PA-C      . thiamine (VITAMIN B-1) tablet 100 mg  100 mg Oral Daily Kerry HoughSpencer E Simon, PA-C   100 mg at 07/27/13 [id number]  . traZODone (DESYREL) tablet 50 mg  50 mg Oral QHS,MR X 1 Kerry HoughSpencer E Simon, PA-C   50 mg at 07/25/13 2156    Lab Results: No results found for this or any previous visit (from the past 48 hour(s)).  Physical Findings: AIMS: Facial and Oral Movements Muscles of Facial Expression: None, normal Lips and Perioral Area: None, normal Jaw: None, normal Tongue: None, normal,Extremity Movements Upper (arms, wrists, hands, fingers): None, normal Lower (legs, knees, ankles, toes): None, normal, Trunk Movements Neck, shoulders, hips: None, normal, Overall Severity Severity of abnormal movements (highest score from questions above): None, normal Incapacitation due to abnormal movements: None, normal Patient's awareness of abnormal movements (rate only patient's report): No Awareness, Dental Status Current problems with teeth and/or dentures?: No Does patient usually wear dentures?: No  CIWA:  CIWA-Ar Total: 2 COWS:     Treatment Plan Summary: Daily contact  with patient to assess and evaluate symptoms and progress in treatment Medication management  Plan: Supportive approach/coping skills/relapse prevention           Resume the Risperdal/Prozac and optimize response           Continue detox/pain management  Medical Decision Making Problem Points:  Review of psycho-social stressors (1) Data Points:  Review of medication regiment & side effects (2)  I certify that inpatient services furnished can reasonably be expected to improve the patient's condition.   Tennie Grussing A 07/27/2013, 5:17 PM

## 2013-07-27 NOTE — [facility] Group Notes (Signed)
[facility] LCSW Group Therapy  07/27/2013 3:09 PM  Type of Therapy:  Group Therapy  Participation Level:  None  Participation Quality:  Drowsy  Affect:  Lethargic  Cognitive:  Lacking  Insight:  Limited  Engagement in Therapy:  Limited  Modes of Intervention:  Confrontation, Discussion, Education, Exploration, Problem-solving, Rapport Building, Socialization and Support  Summary of Progress/Problems: Feelings around Relapse. Group members discussed the meaning of relapse and shared personal stories of relapse, how it affected them and others, and how they perceived themselves during this time. Group members were encouraged to identify triggers, warning signs and coping skills used when facing the possibility of relapse. Social supports were discussed and explored in detail. Post Acute Withdrawal Syndrome (handout provided) was introduced and examined. Pt's were encouraged to ask questions, talk about key points associated with PAWS, and process this information in terms of relapse prevention. Lonnie [name] slept through today's group and had difficulty waking up when RN called him out for meds. Lonnie [name] continues to demonstrate drowsiness and difficulty remaining awake in the group setting.    [name], Lonnie Valli LCSWA  07/27/2013, 3:09 PM

## 2013-07-27 NOTE — [facility] Group Notes (Signed)
[facility]   07/27/2013 9:39 AM  Participation Quality:  Appropriate   Mood/Affect:  Appropriate  Depression Rating:  6  Anxiety Rating:  2  Thoughts of Suicide:  No Will you contract for safety?   NA  Current AVH:  No  Plan for Discharge/Comments:  Pt reports that [facility]. Pt reporting minimal withdrawals.   Transportation Means: aunt or bus  Supports: aunt/some friends and family supports   Counselling psychologistmart, OncologistHeather LCSWA

## 2013-07-27 NOTE — Tx Team (Signed)
Interdisciplinary Treatment Plan Update (Adult)  Date: 07/27/2013   Time Reviewed: 11:39 AM  Progress in Treatment:  Attending groups: Yes  Participating in groups: Minimally when he attends   Taking medication as prescribed: Yes  Tolerating medication: Yes  Family/Significant othe contact made: Not yet. SPE required for this pt.   Patient understands diagnosis: Yes, AEB seeking treatment for ETOH detox, mood stabilization, AH, med management and passive SI.  Discussing patient identified problems/goals with staff: Yes  Medical problems stabilized or resolved: Yes  Denies suicidal/homicidal ideation: Yes during group/self report.  Patient has not harmed self or Others: Yes  New problem(s) identified:  Discharge Plan or Barriers: Pt has [facility] screening and admission on Monday 07/30/13. Pt's aunt will pick him up at 7:15AM on this date. He will need 14 day med supply and SRA completed by Sunday PM. MD notified. [facility] for med management.  Additional comments:  48 Y/O male who states he has been increasingly more depressed. He is having financial difficulties. 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 He states he was not able to get [employer] comp and he is trying to get disability. States he has also pain in his foot that affects his ability to stand for long periods of time. He states he was staying with his grandparents. He got upset when he found the mother of his kid sleeping with another guy. He got more depressed started drinking more. He drinks a 12 pack uses crack $100 worth a day. He smokes pot and used Xanax when he can find it. He hustles to get the money for the drugs and alcohol. States he has had thoughts of wanting to hurt himself. States he realizes he cant continue to live like this Reason for Continuation of Hospitalization: Librium taper-withdrawals Mood stabilization Med management  Estimated length of stay: 3 days (early d/c on Monday-[facility] scheduled  for 8am) For review of initial/current patient goals, please see plan of care.  Attendees:  Patient:    Family:    Physician: Geoffery LyonsIrving Lugo MD  07/27/2013 11:39 AM   Nursing: Alease FrameSheila Lilly RN  07/27/2013 11:39 AM   Clinical Social Worker Leeum Sankey Smart, LCSWA  07/27/2013 11:39 AM   Other: Quintella ReichertBeverly Knight, RN  07/27/2013. 11:39 AM   Other:    Other: Massie Kluverelores Sutton, Community Care Coordinator    Other:    Scribe for Treatment Team:  The Sherwin-WilliamsHeather Smart LCSWA 07/27/2013 11:39 AM

## 2013-07-27 NOTE — [facility] Suicide Risk Assessment (Signed)
[facility] INPATIENT:  Family/Significant Other Suicide Prevention Education  Suicide Prevention Education:  Education Completed; Lonnie BrothersRuby [name] (pt's aunt) [phone] has been identified by the patient as the family member/significant other with whom the patient will be residing, and identified as the person(s) who will aid the patient in the event of a mental health crisis (suicidal ideations/suicide attempt).  With written consent from the patient, the family member/significant other has been provided the following suicide prevention education, prior to the and/or following the discharge of the patient.  The suicide prevention education provided includes the following:  Suicide risk factors  Suicide prevention and interventions  National Suicide Hotline telephone number  [facility] assessment telephone number  [facility] Emergency Assistance 911  [facility] and/or Residential Mobile Crisis Unit telephone number  Request made of family/significant other to:  Remove weapons (e.g., guns, rifles, knives), all items previously/currently identified as safety concern.    Remove drugs/medications (over-the-counter, prescriptions, illicit drugs), all items previously/currently identified as a safety concern.  The family member/significant other verbalizes understanding of the suicide prevention education information provided.  The family member/significant other agrees to remove the items of safety concern listed above.  Smart, Neera Teng LCSWA  07/27/2013, 12:55 PM

## 2013-07-28 LAB — HEMOGLOBIN A1C
HEMOGLOBIN A1C: 5.6 % (ref <5.7)
MEAN PLASMA GLUCOSE: 114 mg/dL (ref <117)

## 2013-07-28 MED ORDER — TRAZODONE HCL 100 MG PO TABS
100.0000 mg | ORAL_TABLET | Freq: Every evening | ORAL | Status: DC | PRN
  Administered 2013-07-28: 100 mg via ORAL
  Filled 2013-07-28 (×6): qty 1

## 2013-07-28 MED ORDER — RISPERIDONE 2 MG PO TABS
4.0000 mg | ORAL_TABLET | Freq: Every day | ORAL | Status: DC
  Administered 2013-07-28: 4 mg via ORAL
  Filled 2013-07-28 (×3): qty 2

## 2013-07-28 NOTE — [facility] Group Notes (Signed)
[facility] Group Notes:  (Nursing/MHT/Case Management/Adjunct)  Date:  07/28/2013  Time:  0900  Type of Therapy:  Self-Inventory Nursing Group  Participation Level:  Minimal  Participation Quality:  Appropriate  Affect:  Appropriate  Cognitive:  Appropriate  Insight:  Improving  Engagement in Group:  Supportive  Modes of Intervention:  Support  Summary of Progress/Problems:  Lonnie [name], Lonnie [name] 07/28/2013, 1:31 PM

## 2013-07-28 NOTE — Progress Notes (Signed)
D   Pt reports having poor sleep and a good appetite   He endorses a low energy level and ability to pay attention is improving   He rates his depression and hopelessness as a 6   He reports on and off suicidal ideation and contracts for safety on the unit   He said his withdrawal symptoms include craving and agitation headaches and dizziness   Pt said he does not plan to go home but is planning to live elsewhere   He is active in unit activities A   Verbal support given   Medications administered and effectiveness monitored   Q 15 min checks    R   Pt safe at present

## 2013-07-28 NOTE — Progress Notes (Signed)
D.  Pt pleasant on approach, denies complaints at this time.  Pleased with increase in Risperdal order and hopeful this will help him to sleep tonight.  Positive for evening AA group with appropriate participation.  Denies SI/HI/hallucinations at this time.  States that he is ready for long term treatment.  A.  Support and encouragement offered  R.  Pt remains safe on unit, will continue to monitor.

## 2013-07-28 NOTE — Progress Notes (Signed)
Psychoeducational Group Note  Date:  07/28/2013 Time:  0945 am  Group Topic/Focus:  Identifying Needs:   The focus of this group is to help patients identify their personal needs that have been historically problematic and identify healthy behaviors to address their needs.  Participation Level:  Active  Participation Quality:  Appropriate  Affect:  Appropriate  Cognitive:  Alert  Insight:  Developing/Improving  Engagement in Group:  Developing/Improving  Additional Comments:    [name] J 07/28/2013,10:25 AM 

## 2013-07-28 NOTE — Progress Notes (Signed)
D: Patient lying in bed. Patient refused to attend group. Comes out only for medication. Patient complained of generalized body pain of 6/10. Requested and received hydrocodone acetaminophen 1 tab. PO prn for moderate pain. Patient denies SI, AH/VH. Patient refused to answer questions/ interact with the writer stated "i'm sleepy; can you please leave me to sleep".  A: medications give as ordered. Safety maintained. Every 15 minutes check  R: Patient complied with medication. Will continue to monitor patient.

## 2013-07-28 NOTE — [facility] Group Notes (Signed)
[facility] Group Notes:  (Clinical Social Work)  07/28/2013     10-11AM  Summary of Progress/Problems:   The main focus of today's process group was for the patient to identify ways in which they have in the past sabotaged their own recovery. Motivational Interviewing and a worksheet were utilized to help patients explore in depth the perceived benefits and costs of their substance use, as well as the potential benefits and costs of stopping.  The Stages of Change were explained using a handout, with an emphasis on making plans to deal with sabotaging behaviors proactively.  The patient expressed that his self-sabotaging behavior is allowing his ex-girlfriend, the mother of his children, to make him mad, because that makes him want to lose his feelings in drugs.  He kept bringing up different scenarios involving the ex- and asking for the group's opinion, was difficult to redirect to how this related to the overall topic of the group of Health Coping Skills.    Type of Therapy:  Group Therapy - Process   Participation Level:  Active  Participation Quality:  Monopolizing, Redirectable and Sharing  Affect:  Blunted and Irritable  Cognitive:  Oriented  Insight:  Limited  Engagement in Therapy:  Developing/Improving  Modes of Intervention:  Education, Support and Processing, Motivational Interviewing  Lonnie MantleMareida Grossman-Orr, LCSW 07/28/2013, 12:25 PM

## 2013-07-28 NOTE — Progress Notes (Signed)
[facility]  07/28/2013 2:41 PM Lonnie JunesHoward J [name]  MRN:  [id number] Subjective:  Lonnie AguasHoward states he did not sleep that well last night. He tolerated the Risperdal 2 mg, will optimize to 4 mg. He is committed to abstinence and to pursue the residential treatment program. He is applying for medicaid to take care of the hernia. Also applying for disability because of his back and his mental disorder Diagnosis:   DSM5: Schizophrenia Disorders:  Schizoaffective  Obsessive-Compulsive Disorders:  none Trauma-Stressor Disorders:  none Substance/Addictive Disorders:  Alcohol Related Disorder - Severe (303.90), Cocaine related disorder Depressive Disorders:  Major Depressive Disorder - Moderate (296.22) Total Time spent with patient: 30 minutes  Axis I: Substance Induced Mood Disorder  ADL's:  Intact  Sleep: Poor  Appetite:  Fair  Suicidal Ideation:  Plan:  denies Intent:  denies Means:  denies Homicidal Ideation:  Plan:  denies Intent:  denies Means:  denies AEB (as evidenced by):  Psychiatric Specialty Exam: Physical Exam  Review of Systems  Constitutional: Negative.   HENT: Negative.   Eyes: Negative.   Respiratory: Negative.   Cardiovascular: Negative.   Gastrointestinal: Negative.   Genitourinary: Negative.   Musculoskeletal: Positive for back pain.  Skin: Negative.   Neurological: Negative.   Endo/Heme/Allergies: Negative.   Psychiatric/Behavioral: Positive for depression and substance abuse. The patient has insomnia.     Blood pressure 101/64, pulse 80, temperature 97.6 F (36.4 C), temperature source Oral, resp. rate 18, height 5\' 10"  (1.778 m), weight 83.915 kg (185 lb), SpO2 98.00%.Body mass index is 26.54 kg/(m^2).  General Appearance: Fairly Groomed  Patent attorneyye Contact::  Fair  Speech:  Clear and Coherent, Slow and not spontaneous  Volume:  Decreased  Mood:  Depressed  Affect:  Restricted  Thought Process:  Coherent and Goal Directed  Orientation:  Full (Time,  Place, and Person)  Thought Content:  symptoms, worries, concerns  Suicidal Thoughts:  No  Homicidal Thoughts:  No  Memory:  Immediate;   Fair Recent;   Fair Remote;   Fair  Judgement:  Fair  Insight:  Present  Psychomotor Activity:  Restlessness  Concentration:  Fair  Recall:  [employer]  Fund of Knowledge:NA  Language: Fair  Akathisia:  No  Handed:    AIMS (if indicated):     Assets:  Desire for Improvement Social Support  Sleep:  Number of Hours: 6.75   Musculoskeletal: Strength & Muscle Tone: within normal limits Gait & Station: normal Patient leans: N/A  Current Medications: Current Facility-Administered Medications  Medication Dose Route Frequency Provider Last Rate Last Dose  . acetaminophen (TYLENOL) tablet 650 mg  650 mg Oral Q6H PRN Kerry HoughSpencer E Simon, PA-C   650 mg at 07/25/13 1827  . alum & mag hydroxide-simeth (MAALOX/MYLANTA) 200-200-20 MG/5ML suspension 30 mL  30 mL Oral Q4H PRN Kerry HoughSpencer E Simon, PA-C   30 mL at 07/28/13 0925  . [START ON 07/29/2013] chlordiazePOXIDE (LIBRIUM) capsule 25 mg  25 mg Oral Daily Kerry HoughSpencer E Simon, PA-C      . cyclobenzaprine (FLEXERIL) tablet 10 mg  10 mg Oral TID Rachael FeeIrving A Tameshia Bonneville, MD   10 mg at 07/28/13 1145  . doxycycline (VIBRA-TABS) tablet 100 mg  100 mg Oral Q12H Rachael FeeIrving A Darshawn Boateng, MD   100 mg at 07/28/13 0753  . FLUoxetine (PROZAC) capsule 20 mg  20 mg Oral Daily Rachael FeeIrving A Reene Harlacher, MD   20 mg at 07/28/13 0753  . HYDROcodone-acetaminophen (NORCO/VICODIN) 5-325 MG per tablet 1 tablet  1 tablet  Oral Q6H PRN Rachael Fee, MD   1 tablet at 07/28/13 1145  . lidocaine (LIDODERM) 5 % 1 patch  1 patch Transdermal Q24H Rachael Fee, MD   1 patch at 07/28/13 1146  . magnesium hydroxide (MILK OF MAGNESIA) suspension 30 mL  30 mL Oral Daily PRN Kerry Hough, PA-C      . multivitamin with minerals tablet 1 tablet  1 tablet Oral Daily Kerry Hough, PA-C   1 tablet at 07/28/13 0753  . mupirocin cream (BACTROBAN) 2 %   Topical BID Rachael Fee, MD      .  nicotine (NICODERM CQ - dosed in mg/24 hours) patch 14 mg  14 mg Transdermal Daily Kerry Hough, PA-C   14 mg at 07/28/13 1610  . prazosin (MINIPRESS) capsule 1 mg  1 mg Oral QHS Kerry Hough, PA-C   1 mg at 07/27/13 2156  . risperiDONE (RISPERDAL) tablet 2 mg  2 mg Oral QHS Rachael Fee, MD   2 mg at 07/27/13 2155  . thiamine (B-1) injection 100 mg  100 mg Intramuscular Once [employer], PA-C      . thiamine (VITAMIN B-1) tablet 100 mg  100 mg Oral Daily Kerry Hough, PA-C   100 mg at 07/28/13 0753  . traZODone (DESYREL) tablet 50 mg  50 mg Oral QHS,MR X 1 Kerry Hough, PA-C   50 mg at 07/27/13 2157    Lab Results:  Results for orders placed during the hospital encounter of 07/24/13 (from the past 48 hour(s))  HEMOGLOBIN A1C     Status: None   Collection Time    07/27/13  7:35 PM      Result Value Ref Range   Hemoglobin A1C 5.6  <5.7 %   Comment: (NOTE)                                                                               According to the ADA Clinical Practice Recommendations for 2011, when     HbA1c is used as a screening test:      >=6.5%   Diagnostic of Diabetes Mellitus               (if abnormal result is confirmed)     5.7-6.4%   Increased risk of developing Diabetes Mellitus     References:Diagnosis and Classification of Diabetes Mellitus,Diabetes     Care,2011,34(Suppl 1):S62-S69 and Standards of Medical Care in             Diabetes - 2011,Diabetes Care,2011,34 (Suppl 1):S11-S61.   Mean Plasma Glucose 114  <117 mg/dL   Comment: Performed at [facility]    Physical Findings: AIMS: Facial and Oral Movements Muscles of Facial Expression: None, normal Lips and Perioral Area: None, normal Jaw: None, normal Tongue: None, normal,Extremity Movements Upper (arms, wrists, hands, fingers): None, normal Lower (legs, knees, ankles, toes): None, normal, Trunk Movements Neck, shoulders, hips: None, normal, Overall Severity Severity of abnormal  movements (highest score from questions above): None, normal Incapacitation due to abnormal movements: None, normal Patient's awareness of abnormal movements (rate only patient's report): No Awareness, Dental Status Current problems with teeth  and/or dentures?: No Does patient usually wear dentures?: No  CIWA:  CIWA-Ar Total: 6 COWS:     Treatment Plan Summary: Daily contact with patient to assess and evaluate symptoms and progress in treatment Medication management  Plan: Supportive approach/coping skills/relapse prevention           Increase the Risperdal to 4 mg HS  Medical Decision Making Problem Points:  Review of psycho-social stressors (1) Data Points:  Review of medication regiment & side effects (2) Review of new medications or change in dosage (2)  I certify that inpatient services furnished can reasonably be expected to improve the patient's condition.   Susi Goslin A 07/28/2013, 2:41 PM

## 2013-07-29 DIAGNOSIS — F259 Schizoaffective disorder, unspecified: Secondary | ICD-10-CM | POA: Diagnosis present

## 2013-07-29 LAB — POC OCCULT BLOOD, ED: FECAL OCCULT BLD: NEGATIVE

## 2013-07-29 LAB — TROPONIN I: Troponin I: <0.3 ng/mL (ref <0.30)

## 2013-07-29 LAB — COMPREHENSIVE METABOLIC PANEL
ALBUMIN: 3.4 g/dL — AB (ref 3.5–5.2)
ALK PHOS: 51 U/L (ref 39–117)
ALT: 42 U/L (ref 0–53)
AST: 47 U/L — AB (ref 0–37)
Anion gap: 11 (ref 5–15)
BUN: 18 mg/dL (ref 6–23)
CO2: 25 mEq/L (ref 19–32)
Calcium: 9.6 mg/dL (ref 8.4–10.5)
Chloride: 101 mEq/L (ref 96–112)
Creatinine, Ser: 1.13 mg/dL (ref 0.50–1.35)
GFR calc Af Amer: 88 mL/min — ABNORMAL LOW (ref >90)
GFR calc non Af Amer: 76 mL/min — ABNORMAL LOW (ref >90)
Glucose, Bld: 116 mg/dL — ABNORMAL HIGH (ref 70–99)
POTASSIUM: 5.5 meq/L — AB (ref 3.7–5.3)
SODIUM: 137 meq/L (ref 137–147)
TOTAL PROTEIN: 7.1 g/dL (ref 6.0–8.3)
Total Bilirubin: <0.2 mg/dL — ABNORMAL LOW (ref 0.3–1.2)

## 2013-07-29 LAB — CBC
HCT: 38.9 % — ABNORMAL LOW (ref 39.0–52.0)
Hemoglobin: 13 g/dL (ref 13.0–17.0)
MCH: 28.4 pg (ref 26.0–34.0)
MCHC: 33.4 g/dL (ref 30.0–36.0)
MCV: 85.1 fL (ref 78.0–100.0)
PLATELETS: 295 10*3/uL (ref 150–400)
RBC: 4.57 MIL/uL (ref 4.22–5.81)
RDW: 15.2 % (ref 11.5–15.5)
WBC: 5.8 10*3/uL (ref 4.0–10.5)

## 2013-07-29 LAB — TYPE AND SCREEN
ABO/RH(D): A POS
ANTIBODY SCREEN: NEGATIVE

## 2013-07-29 LAB — OCCULT BLOOD GASTRIC / DUODENUM (SPECIMEN CUP)
OCCULT BLOOD, GASTRIC: POSITIVE — AB
PH, GASTRIC: 4

## 2013-07-29 LAB — PROTIME-INR
INR: 0.93 (ref 0.00–1.49)
Prothrombin Time: 12.5 seconds (ref 11.6–15.2)

## 2013-07-29 LAB — ABO/RH: ABO/RH(D): A POS

## 2013-07-29 LAB — POTASSIUM: POTASSIUM: 5.3 meq/L (ref 3.7–5.3)

## 2013-07-29 MED ORDER — RISPERIDONE 4 MG PO TABS: 4.0000 mg | ORAL_TABLET | Freq: Every day | ORAL | Status: DC

## 2013-07-29 MED ORDER — SODIUM CHLORIDE 0.9 % IV SOLN
80.0000 mg | Freq: Once | INTRAVENOUS | Status: AC
  Administered 2013-07-29: 80 mg via INTRAVENOUS
  Filled 2013-07-29: qty 80

## 2013-07-29 MED ORDER — ONDANSETRON HCL 4 MG/2ML IJ SOLN
4.0000 mg | Freq: Once | INTRAMUSCULAR | Status: AC
  Administered 2013-07-29: 4 mg via INTRAVENOUS

## 2013-07-29 MED ORDER — HYDROCODONE-ACETAMINOPHEN 5-325 MG PO TABS: 1.0000 | ORAL_TABLET | Freq: Four times a day (QID) | ORAL | Status: DC | PRN

## 2013-07-29 MED ORDER — MUPIROCIN CALCIUM 2 % EX CREA: TOPICAL_CREAM | Freq: Two times a day (BID) | CUTANEOUS | Status: DC

## 2013-07-29 MED ORDER — ONDANSETRON HCL 4 MG/2ML IJ SOLN
4.0000 mg | Freq: Once | INTRAMUSCULAR | Status: DC
  Filled 2013-07-29: qty 2

## 2013-07-29 MED ORDER — DOXYCYCLINE HYCLATE 100 MG PO TABS: 100.0000 mg | ORAL_TABLET | Freq: Two times a day (BID) | ORAL | Status: DC

## 2013-07-29 MED ORDER — CYCLOBENZAPRINE HCL 10 MG PO TABS: 10.0000 mg | ORAL_TABLET | Freq: Three times a day (TID) | ORAL | Status: DC

## 2013-07-29 MED ORDER — PRAZOSIN HCL 1 MG PO CAPS: 1.0000 mg | ORAL_CAPSULE | Freq: Every day | ORAL | Status: DC

## 2013-07-29 MED ORDER — FLUOXETINE HCL 20 MG PO CAPS: 20.0000 mg | ORAL_CAPSULE | Freq: Every day | ORAL | Status: DC

## 2013-07-29 MED ORDER — LIDOCAINE 5 % EX PTCH: 1.0000 | MEDICATED_PATCH | CUTANEOUS | Status: DC

## 2013-07-29 NOTE — Progress Notes (Signed)
Pt feeling very bad, lying in bed.  Pt states that after he returned from basketball this evening he began experiencing a sharp abdominal pain and later than became abdominal and chest pain.  Pt, at 2013, requested that I come to his room to look at emesis, Pt states "I'm throwing up blood".  Pt very frightened and concerned.  Alberteen SamFran Hobson, NP, notified and order received [facility].  Pt's vital signs are stable at 136/84, HR 97, O2 sat of 96% on RA.  Pt is afrebrile.  As [facility] was [facility], Pt began again throwing up dark red emesis.  Charge RN went to assist Pt and got him a chair until [place] for transport.  Transport arrived at 2040 and Pt transferred with MHT present by [facility].  Pt continued to endorse nausea and light headedness before transport but was not in severe distress, and was not clammy or sweating.  Pt assisted to car and assisted safely into vehicle.

## 2013-07-29 NOTE — [facility] Suicide Risk Assessment (Signed)
Suicide Risk Assessment  Discharge Assessment     Demographic Factors:  Male  Total Time spent with patient: 45 minutes  Psychiatric Specialty Exam:     Blood pressure 109/79, pulse 106, temperature 97.5 F (36.4 C), temperature source Oral, resp. rate 18, height 5\' 10"  (1.778 m), weight 83.915 kg (185 lb), SpO2 98.00%.Body mass index is 26.54 kg/(m^2).  General Appearance: Fairly Groomed  Patent attorneyye Contact::  Fair  Speech:  Clear and Coherent  Volume:  Decreased  Mood:  Anxious and worried  Affect:  Restricted  Thought Process:  Coherent and Goal Directed  Orientation:  Full (Time, Place, and Person)  Thought Content:  plans as he moves on, relapse prevention plan  Suicidal Thoughts:  No  Homicidal Thoughts:  No  Memory:  Immediate;   Fair Recent;   Fair Remote;   Fair  Judgement:  Fair  Insight:  Present  Psychomotor Activity:  Normal  Concentration:  Fair  Recall:  [employer]Fair  Fund of Knowledge:NA  Language: Fair  Akathisia:  No  Handed:    AIMS (if indicated):     Assets:  Desire for Improvement Social Support  Sleep:  Number of Hours: 6.75    Musculoskeletal: Strength & Muscle Tone: within normal limits Gait & Station: normal Patient leans: N/A   Mental Status Per Nursing Assessment::   On Admission:  Self-harm thoughts  Current Mental Status by Physician: In full contact with reality. There are no active S/S of withdrawal. There are no active, SI, HI, plans or intent or hallucinations or delusions. Would like to go to [facility] after [facility]   Loss Factors: Decline in physical health  Historical Factors: NA  Risk Reduction Factors:   Positive social support  Continued Clinical Symptoms:  Depression:   Comorbid alcohol abuse/dependence Alcohol/Substance Abuse/Dependencies  Cognitive Features That Contribute To Risk:  Closed-mindedness Polarized thinking Thought constriction (tunnel vision)    Suicide Risk:  Minimal: No identifiable suicidal ideation.   Patients presenting with no risk factors but with morbid ruminations; may be classified as minimal risk based on the severity of the depressive symptoms  Discharge Diagnoses:   AXIS I:  Alcohol Dependence, Cocaine Dependence, Schizoaffective Disorder AXIS II:  No diagnosis AXIS III:   Past Medical History  Diagnosis Date  . GERD (gastroesophageal reflux disease)   . Depression    AXIS IV:  other psychosocial or environmental problems AXIS V:  61-70 mild symptoms  Plan Of Care/Follow-up recommendations:  Activity:  as tolerated Diet:  regular Follow up [facility] Is patient on multiple antipsychotic therapies at discharge:  No   Has Patient had three or more failed trials of antipsychotic monotherapy by history:  No  Recommended Plan for Multiple Antipsychotic Therapies: NA    Dillon Livermore A 07/29/2013, 4:52 PM

## 2013-07-29 NOTE — [facility] Group Notes (Signed)
[facility] Group Notes:  (Nursing/MHT/Case Management/Adjunct)  Date:  07/29/2013  Time:  0900  Type of Therapy:  Self-Inventory Nursing Group  Participation Level:  Did Not Attend   Lonnie [name], Lonnie [name] 07/29/2013, 10:41 AM

## 2013-07-29 NOTE — Progress Notes (Signed)
Pt attended group this afternoon. Pt participate in group with peers.  

## 2013-07-29 NOTE — ED Provider Notes (Signed)
CSN: [id number]     Arrival date & time 07/29/13  2044 History   First MD Initiated Contact with Patient 07/29/13 2121     Chief Complaint  Patient presents with  . Hematemesis     (Consider location/radiation/quality/duration/timing/severity/associated sxs/prior Treatment) The history is provided by the patient and medical records. No language interpreter was used.    Lonnie [name] is a 48 y.o. male  with a hx of alcoholism (for his "entire life") presents to the Emergency Department complaining of acute left sided chest pain onset 1 hour PTA.  He reports sharp left sided chest pain and then began vomiting.  He reports bright red blood for the first 2 episodes and then brown material after than.  Pt reports he has had nausea all day.  Pt denies known cardiac or liver problems and denies being Dx with cirrhosis or esophageal varices. No aggravating or alleviating factors.  Pt denies fever, chills, headache, neck pain, SOB, abd pain, diarrhea, weakness, dizziness, syncope.  Pt reports last BM was yesterday and without melena or hematochezia.    Past Medical History  Diagnosis Date  . GERD (gastroesophageal reflux disease)   . Depression    Past Surgical History  Procedure Laterality Date  . Back surgery    . Neck surgery     Family History  Problem Relation Age of Onset  . Diabetes Mother   . Hypertension Mother   . Hyperlipidemia Father   . Heart attack Neg Hx   . Sudden death Neg Hx    History  Substance Use Topics  . Smoking status: Current Every Day Smoker -- 0.50 packs/day for 15 years    Types: Cigarettes    Last Attempt to Quit: 08/08/2012  . Smokeless tobacco: Never Used  . Alcohol Use: Yes     Comment: 12 pk daily    Review of Systems  Constitutional: Negative for fever, diaphoresis, appetite change, fatigue and unexpected weight change.  HENT: Negative for mouth sores.   Eyes: Negative for visual disturbance.  Respiratory: Negative for cough, chest tightness,  shortness of breath and wheezing.   Cardiovascular: Positive for chest pain.  Gastrointestinal: Positive for nausea and vomiting. Negative for abdominal pain, diarrhea and constipation.       Hematemesis  Endocrine: Negative for polydipsia, polyphagia and polyuria.  Genitourinary: Negative for dysuria, urgency, frequency and hematuria.  Musculoskeletal: Negative for back pain and neck stiffness.  Skin: Negative for rash.  Allergic/Immunologic: Negative for immunocompromised state.  Neurological: Negative for syncope, light-headedness and headaches.  Hematological: Does not bruise/bleed easily.  Psychiatric/Behavioral: Negative for sleep disturbance. The patient is not nervous/anxious.       Allergies  Ibuprofen  Home Medications   Prior to Admission medications   Medication Sig Start Date End Date Taking? Authorizing Provider  cyclobenzaprine (FLEXERIL) 10 MG tablet Take 1 tablet (10 mg total) by mouth 3 (three) times daily. 07/29/13   Beau FannyJohn C Withrow, FNP  doxycycline (VIBRA-TABS) 100 MG tablet Take 1 tablet (100 mg total) by mouth every 12 (twelve) hours. 07/29/13   Beau FannyJohn C Withrow, FNP  FLUoxetine (PROZAC) 20 MG capsule Take 1 capsule (20 mg total) by mouth daily. 07/29/13   Beau FannyJohn C Withrow, FNP  FLUoxetine (PROZAC) 40 MG capsule Take 40 mg by mouth daily.  05/23/13 05/23/14  Historical Provider, MD  HYDROcodone-acetaminophen (NORCO/VICODIN) 5-325 MG per tablet Take 1 tablet by mouth every 6 (six) hours as needed for moderate pain. 07/29/13   Beau FannyJohn C Withrow, FNP  lidocaine (LIDODERM) 5 % Place 1 patch onto the skin daily. Remove & Discard patch within 12 hours or as directed by MD 07/29/13   Beau FannyJohn C Withrow, FNP  mupirocin cream (BACTROBAN) 2 % Apply topically 2 (two) times daily. 07/29/13   Beau FannyJohn C Withrow, FNP  prazosin (MINIPRESS) 1 MG capsule Take 1 capsule (1 mg total) by mouth at bedtime. 07/29/13 07/29/14  Beau FannyJohn C Withrow, FNP  risperidone (RISPERDAL) 4 MG tablet Take 4 mg by mouth daily.     Historical Provider, MD  risperiDONE (RISPERDAL) 4 MG tablet Take 1 tablet (4 mg total) by mouth at bedtime. 07/29/13   Beau FannyJohn C Withrow, FNP   BP 119/78  Pulse 91  Temp(Src) 98.5 F (36.9 C) (Oral)  Resp 24  Ht 5\' 10"  (1.778 m)  Wt 185 lb (83.915 kg)  BMI 26.54 kg/m2  SpO2 100% Physical Exam  Nursing note and vitals reviewed. Constitutional: He is oriented to person, place, and time. He appears well-developed and well-nourished. No distress.  Awake, alert, vomiting  HENT:  Head: Normocephalic and atraumatic.  Mouth/Throat: Oropharynx is clear and moist. No oropharyngeal exudate.  Eyes: Conjunctivae are normal. No scleral icterus.  Neck: Normal range of motion. Neck supple.  Cardiovascular: Normal rate, regular rhythm, normal heart sounds and intact distal pulses.   No murmur heard. Pulmonary/Chest: Effort normal and breath sounds normal. No respiratory distress. He has no wheezes.  No reproducible chest pain  Abdominal: Soft. Bowel sounds are normal. He exhibits no mass. There is no tenderness. There is no rebound and no guarding.  Abdomen nondistended, soft and nontender  Musculoskeletal: Normal range of motion. He exhibits no edema.  Lymphadenopathy:    He has no cervical adenopathy.  Neurological: He is alert and oriented to person, place, and time. He exhibits normal muscle tone. Coordination normal.  Speech is clear and goal oriented Moves extremities without ataxia  Skin: Skin is warm and dry. He is not diaphoretic. No erythema.  Psychiatric: He has a normal mood and affect.    ED Course  Procedures (including critical care time) Labs Review Labs Reviewed  CBC - Abnormal; Notable for the following:    HCT 38.9 (*)    All other components within normal limits  COMPREHENSIVE METABOLIC PANEL - Abnormal; Notable for the following:    Potassium 5.5 (*)    Glucose, Bld 116 (*)    Albumin 3.4 (*)    AST 47 (*)    Total Bilirubin <0.2 (*)    GFR calc non Af Amer 76 (*)     GFR calc Af Amer 88 (*)    All other components within normal limits  OCCULT BLOOD GASTRIC / DUODENUM (SPECIMEN CUP) - Abnormal; Notable for the following:    Occult Blood, Gastric POSITIVE (*)    All other components within normal limits  HEMOGLOBIN A1C  PROTIME-INR  TROPONIN I  POTASSIUM  POC OCCULT BLOOD, ED  TYPE AND SCREEN  ABO/RH    Imaging Review No results found.   EKG Interpretation   Date/Time:  Sunday July 29 2013 21:23:43 EDT Ventricular Rate:  90 PR Interval:  137 QRS Duration: 75 QT Interval:  347 QTC Calculation: 424 R Axis:   60 Text Interpretation:  Sinus rhythm ST elev, probable normal early repol  pattern No significant change since last tracing Confirmed by [facility]  MD,  MARTHA [phone] on 07/29/2013 11:14:30 PM      MDM   Final diagnoses:  Alcohol dependence with alcohol-induced  mood disorder  Hematemesis with nausea   REX OESTERLE presents with long-standing history of alcohol abuse but denies known history of cirrhosis or esophageal varices. Presents to emergency department after 4 episodes of bright red hematemesis.  Patient vomiting on arrival with stomach contents mixed with brown fluid.    11:11 PM Patient given Zofran with cessation of vomiting. No emesis in greater than one hour.  Patient labs reassuring without anemia.  Initial potassium 5.5 with hemolysis noted.  AST slightly elevated at 47 and ALT normal at 42.  ECG with early repol pattern, troponin negative.  Repeat potassium pending. Plan for admission.  The patient was discussed with and seen by Dr. Karma Ganja who agrees with the treatment plan.  12:06 AM Pt without hyperkalemia on repeat.  Discussed with Dr. Lovell Sheehan who will admit but requests GI consult for morning evaluation.    12:14 AM Discussed with Dr. Randa Evens of [facility] who agrees to see the patient in the morning.    BP 119/78  Pulse 91  Temp(Src) 98.5 F (36.9 C) (Oral)  Resp 24  Ht 5\' 10"  (1.778 m)  Wt 185 lb  (83.915 kg)  BMI 26.54 kg/m2  SpO2 100%   Dierdre Forth, PA-C 07/30/13 0015

## 2013-07-29 NOTE — Progress Notes (Signed)
[facility] MD Progress Note  07/29/2013 2:58 PM Lonnie [name]  MRN:  [id number] Subjective:  States he did sleep better although not straight feels more rested. The back pain is better. He is looking forward to pursuing treatment at [facility]. Would like once he is out of [facility] to be referred to [facility] in [place]. States he is committed to make this work Diagnosis:   DSM5: Schizophrenia Disorders:  Schizoaffective Obsessive-Compulsive Disorders:  none Trauma-Stressor Disorders:  none Substance/Addictive Disorders:  Alcohol Related Disorder - Severe (303.90), Cocaine related disorder Depressive Disorders:  Major Depressive Disorder - Moderate (296.22) Total Time spent with patient: 30 minutes  Axis I: Substance Induced Mood Disorder  ADL's:  Intact  Sleep: Fair  Appetite:  Fair  Suicidal Ideation:  Plan:  denies Intent:  denies Means:  denies Homicidal Ideation:  Plan:  denies Intent:  denies Means:  denies AEB (as evidenced by):  Psychiatric Specialty Exam: Physical Exam  Review of Systems  Constitutional: Negative.   HENT: Negative.   Eyes: Negative.   Cardiovascular: Negative.   Gastrointestinal: Negative.   Genitourinary: Negative.   Musculoskeletal: Positive for back pain.  Skin: Negative.   Neurological: Negative.   Endo/Heme/Allergies: Negative.   Psychiatric/Behavioral: Positive for substance abuse. The patient is nervous/anxious.     Blood pressure 109/79, pulse 106, temperature 97.5 F (36.4 C), temperature source Oral, resp. rate 18, height 5\' 10"  (1.778 m), weight 83.915 kg (185 lb), SpO2 98.00%.Body mass index is 26.54 kg/(m^2).  General Appearance: Fairly Groomed  Patent attorney::  Fair  Speech:  Clear and Coherent  Volume:  Decreased  Mood:  worried  Affect:  Restricted  Thought Process:  Coherent and Goal Directed  Orientation:  Full (Time, Place, and Person)  Thought Content:  symptoms worries concerns  Suicidal Thoughts:  No  Homicidal Thoughts:   No  Memory:  Immediate;   Fair Recent;   Fair Remote;   Fair  Judgement:  Fair  Insight:  Present  Psychomotor Activity:  Restlessness  Concentration:  Fair  Recall:  [employer] of Knowledge:NA  Language: Fair  Akathisia:  No  Handed:    AIMS (if indicated):     Assets:  Desire for Improvement Social Support  Sleep:  Number of Hours: 6.75   Musculoskeletal: Strength & Muscle Tone: within normal limits Gait & Station: normal Patient leans: N/A  Current Medications: Current Facility-Administered Medications  Medication Dose Route Frequency Provider Last Rate Last Dose  . acetaminophen (TYLENOL) tablet 650 mg  650 mg Oral Q6H PRN Kerry Hough, PA-C   650 mg at 07/25/13 1827  . alum & mag hydroxide-simeth (MAALOX/MYLANTA) 200-200-20 MG/5ML suspension 30 mL  30 mL Oral Q4H PRN Kerry Hough, PA-C   30 mL at 07/29/13 1323  . cyclobenzaprine (FLEXERIL) tablet 10 mg  10 mg Oral TID Rachael Fee, MD   10 mg at 07/29/13 1136  . doxycycline (VIBRA-TABS) tablet 100 mg  100 mg Oral Q12H Rachael Fee, MD   100 mg at 07/29/13 0851  . FLUoxetine (PROZAC) capsule 20 mg  20 mg Oral Daily Rachael Fee, MD   20 mg at 07/29/13 0851  . HYDROcodone-acetaminophen (NORCO/VICODIN) 5-325 MG per tablet 1 tablet  1 tablet Oral Q6H PRN Rachael Fee, MD   1 tablet at 07/29/13 1139  . lidocaine (LIDODERM) 5 % 1 patch  1 patch Transdermal Q24H Rachael Fee, MD   1 patch at 07/29/13 1136  . magnesium  hydroxide (MILK OF MAGNESIA) suspension 30 mL  30 mL Oral Daily PRN Spencer E Simon, PA-C      . multivitamin with minerals tablet 1 tablet  1 tablet Oral Daily Kerry HoughKerry HoughSpencer E Simon, PA-C   1 tablet at 07/29/13 0851  . mupirocin cream (BACTROBAN) 2 %   Topical BID Rachael FeeIrving A Hellon Vaccarella, MD      . nicotine (NICODERM CQ - dosed in mg/24 hours) patch 14 mg  14 mg Transdermal Daily Kerry HoughSpencer E Simon, PA-C   14 mg at 07/29/13 0850  . prazosin (MINIPRESS) capsule 1 mg  1 mg Oral QHS Kerry HoughSpencer E Simon, PA-C   1 mg at 07/28/13  2108  . risperiDONE (RISPERDAL) tablet 4 mg  4 mg Oral QHS Rachael FeeIrving A Idris Edmundson, MD   4 mg at 07/28/13 2108  . thiamine (B-1) injection 100 mg  100 mg Intramuscular Once IntelSpencer E Simon, PA-C      . thiamine (VITAMIN B-1) tablet 100 mg  100 mg Oral Daily Kerry HoughSpencer E Simon, PA-C   100 mg at 07/29/13 0851  . traZODone (DESYREL) tablet 100 mg  100 mg Oral QHS,MR X 1 Rachael FeeIrving A Yani Lal, MD   100 mg at 07/28/13 2108    Lab Results:  Results for orders placed during the hospital encounter of 07/24/13 (from the past 48 hour(s))  HEMOGLOBIN A1C     Status: None   Collection Time    07/27/13  7:35 PM      Result Value Ref Range   Hemoglobin A1C 5.6  <5.7 %   Comment: (NOTE)                                                                               According to the ADA Clinical Practice Recommendations for 2011, when     HbA1c is used as a screening test:      >=6.5%   Diagnostic of Diabetes Mellitus               (if abnormal result is confirmed)     5.7-6.4%   Increased risk of developing Diabetes Mellitus     References:Diagnosis and Classification of Diabetes Mellitus,Diabetes     Care,2011,34(Suppl 1):S62-S69 and Standards of Medical Care in             Diabetes - 2011,Diabetes Care,2011,34 (Suppl 1):S11-S61.   Mean Plasma Glucose 114  <117 mg/dL   Comment: Performed at [facility]    Physical Findings: AIMS: Facial and Oral Movements Muscles of Facial Expression: None, normal Lips and Perioral Area: None, normal Jaw: None, normal Tongue: None, normal,Extremity Movements Upper (arms, wrists, hands, fingers): None, normal Lower (legs, knees, ankles, toes): None, normal, Trunk Movements Neck, shoulders, hips: None, normal, Overall Severity Severity of abnormal movements (highest score from questions above): None, normal Incapacitation due to abnormal movements: None, normal Patient's awareness of abnormal movements (rate only patient's report): No Awareness, Dental Status Current  problems with teeth and/or dentures?: No Does patient usually wear dentures?: No  CIWA:  CIWA-Ar Total: 0 COWS:     Treatment Plan Summary: Daily contact with patient to assess and evaluate symptoms and progress in treatment Medication management  Plan: Supportive  approach/coping skills/relapse prevention           [facility]  Medical Decision Making Problem Points:  Review of psycho-social stressors (1) Data Points:  Review of new medications or change in dosage (2)  I certify that inpatient services furnished can reasonably be expected to improve the patient's condition.   Keishia Ground A 07/29/2013, 2:58 PM

## 2013-07-29 NOTE — Progress Notes (Signed)
Pt is very pleasant and cooperative. He stated he is [facility]. Pt stated he has now been sober for 7 days and it feels good. His longest sobriety was for 3 years. He was given a vicodin at 11:45am for back pain a 8/10. Lidocaine patch was applied to the low part of his back. His stated his goal to day was to try to stay positive. He denies SI and HI and contracts for safety.

## 2013-07-29 NOTE — [facility] Group Notes (Signed)
[facility] Group Notes:  Healthy coping skills  Date:  07/29/2013  Time:  2:10 PM  Type of Therapy:  Nurse Education  Participation Level:  Did Not Attend  Participation Quality:  Inattentive  Affect:  Blunted  Cognitive:  Lacking  Insight:  None  Engagement in Group:  None  Modes of Intervention:  Education  Summary of Progress/Problems:Pt did not attend group  Rodman KeyWebb, Marabeth Melland Guyes 07/29/2013, 2:10 PM

## 2013-07-29 NOTE — ED Notes (Addendum)
Pt states he awoke from sleeping @ 1 hour ago with L sided CP non radiating then hematemesis x 4,pt states 1st 2 episodes he noted bright red blood, next 2 brown contents. Pt arrived actively vomiting, brown contents collected,sent to lab. Pt appears weak. Pt denies SHOB, denies BM today but states last BM was normal. Pt states he has ever had blood in vomit or stool in the past, pt denies heavy drinking.

## 2013-07-29 NOTE — ED Notes (Signed)
Bed: [id number] Expected date:  Expected time:  Means of arrival:  [facility]

## 2013-07-29 NOTE — [facility] Group Notes (Signed)
[facility] Group Notes:  (Clinical Social Work)  07/29/2013  10:00-11:00AM  Summary of Progress/Problems:   The main focus of today's process group was to   identify the patient's current support system and decide on other supports that can be put in place.  The picture on workbook was used to discuss why additional supports are needed.  An emphasis was placed on using counselor, doctor, therapy groups, 12-step groups, and problem-specific support groups to expand supports.   There was also an extensive discussion about what constitutes a healthy support versus an unhealthy support.  The patient expressed full comprehension of the concepts presented, and agreed that there is a need to add more supports.  The patient stated the current supports in place include a friend of his with whom he spent time in prison, who is now a drug dealer because he cannot find employment due to his record.  This friend will not give him or sell him drugs, and is very happy Lonnie [name] is in treatment now.  Lonnie [name] states he had 3 years clean, and started using because he got angry at his ex-girlfriend.  [facility] whom he [facility], and whom he thinks can help him when he gets out of [facility] to work on staying sober.  Type of Therapy:  Process Group with Motivational Interviewing  Participation Level:  Active  Participation Quality:  Drowsy  Affect:  Blunted  Cognitive:  Oriented  Insight:  Limited  Engagement in Therapy:  Limited  Modes of Intervention:   Education, Support and Processing, Activity  Pilgrim's PrideMareida Grossman-Orr, LCSW 07/29/2013, 12:15pm

## 2013-07-29 NOTE — ED Notes (Signed)
[facility] of [facility]em[facility].

## 2013-07-30 ENCOUNTER — Inpatient Hospital Stay (HOSPITAL_COMMUNITY): Payer: Self-pay | Admitting: Anesthesiology

## 2013-07-30 ENCOUNTER — Encounter (HOSPITAL_COMMUNITY): Payer: Self-pay | Admitting: *Deleted

## 2013-07-30 ENCOUNTER — Inpatient Hospital Stay (HOSPITAL_COMMUNITY)
Admission: AD | Admit: 2013-07-30 | Discharge: 2013-07-31 | DRG: 378 | Discharge status: Against Medical Advice | Payer: Self-pay | Source: Other Acute Inpatient Hospital | Attending: Internal Medicine | Admitting: Internal Medicine

## 2013-07-30 ENCOUNTER — Encounter (HOSPITAL_COMMUNITY)
Admission: AD | Discharge status: Against Medical Advice | Payer: Self-pay | Source: Other Acute Inpatient Hospital | Attending: Internal Medicine

## 2013-07-30 ENCOUNTER — Encounter (HOSPITAL_COMMUNITY): Payer: Self-pay | Admitting: Anesthesiology

## 2013-07-30 ENCOUNTER — Inpatient Hospital Stay (HOSPITAL_COMMUNITY): Payer: Federal, State, Local not specified - Other

## 2013-07-30 DIAGNOSIS — Z8249 Family history of ischemic heart disease and other diseases of the circulatory system: Secondary | ICD-10-CM

## 2013-07-30 DIAGNOSIS — M545 Low back pain, unspecified: Secondary | ICD-10-CM

## 2013-07-30 DIAGNOSIS — F259 Schizoaffective disorder, unspecified: Secondary | ICD-10-CM | POA: Diagnosis present

## 2013-07-30 DIAGNOSIS — F1210 Cannabis abuse, uncomplicated: Secondary | ICD-10-CM | POA: Diagnosis present

## 2013-07-30 DIAGNOSIS — K449 Diaphragmatic hernia without obstruction or gangrene: Secondary | ICD-10-CM | POA: Diagnosis present

## 2013-07-30 DIAGNOSIS — K2991 Gastroduodenitis, unspecified, with bleeding: Secondary | ICD-10-CM

## 2013-07-30 DIAGNOSIS — F339 Major depressive disorder, recurrent, unspecified: Secondary | ICD-10-CM | POA: Diagnosis present

## 2013-07-30 DIAGNOSIS — K2990 Gastroduodenitis, unspecified, without bleeding: Secondary | ICD-10-CM

## 2013-07-30 DIAGNOSIS — F332 Major depressive disorder, recurrent severe without psychotic features: Secondary | ICD-10-CM

## 2013-07-30 DIAGNOSIS — F1420 Cocaine dependence, uncomplicated: Secondary | ICD-10-CM | POA: Diagnosis present

## 2013-07-30 DIAGNOSIS — Z833 Family history of diabetes mellitus: Secondary | ICD-10-CM

## 2013-07-30 DIAGNOSIS — K920 Hematemesis: Principal | ICD-10-CM | POA: Diagnosis present

## 2013-07-30 DIAGNOSIS — K922 Gastrointestinal hemorrhage, unspecified: Secondary | ICD-10-CM | POA: Diagnosis present

## 2013-07-30 DIAGNOSIS — G8929 Other chronic pain: Secondary | ICD-10-CM | POA: Diagnosis present

## 2013-07-30 DIAGNOSIS — R110 Nausea: Secondary | ICD-10-CM

## 2013-07-30 DIAGNOSIS — F331 Major depressive disorder, recurrent, moderate: Secondary | ICD-10-CM

## 2013-07-30 DIAGNOSIS — F17200 Nicotine dependence, unspecified, uncomplicated: Secondary | ICD-10-CM | POA: Diagnosis present

## 2013-07-30 DIAGNOSIS — F1020 Alcohol dependence, uncomplicated: Secondary | ICD-10-CM | POA: Diagnosis present

## 2013-07-30 DIAGNOSIS — K2971 Gastritis, unspecified, with bleeding: Secondary | ICD-10-CM

## 2013-07-30 DIAGNOSIS — K219 Gastro-esophageal reflux disease without esophagitis: Secondary | ICD-10-CM | POA: Diagnosis present

## 2013-07-30 DIAGNOSIS — K2970 Gastritis, unspecified, without bleeding: Secondary | ICD-10-CM | POA: Diagnosis present

## 2013-07-30 DIAGNOSIS — K259 Gastric ulcer, unspecified as acute or chronic, without hemorrhage or perforation: Secondary | ICD-10-CM | POA: Diagnosis present

## 2013-07-30 HISTORY — DX: Sedative, hypnotic or anxiolytic abuse, uncomplicated: F13.10

## 2013-07-30 HISTORY — DX: Low back pain: M54.5

## 2013-07-30 HISTORY — DX: Cannabis abuse, uncomplicated: F12.10

## 2013-07-30 HISTORY — PX: ESOPHAGOGASTRODUODENOSCOPY (EGD) WITH PROPOFOL: SHX5813

## 2013-07-30 HISTORY — DX: Alcohol abuse, uncomplicated: F10.10

## 2013-07-30 HISTORY — DX: Schizophrenia, unspecified: F20.9

## 2013-07-30 HISTORY — DX: Cocaine abuse, uncomplicated: F14.10

## 2013-07-30 HISTORY — DX: Low back pain, unspecified: M54.50

## 2013-07-30 LAB — BASIC METABOLIC PANEL
ANION GAP: 12 (ref 5–15)
BUN: 17 mg/dL (ref 6–23)
CO2: 26 mEq/L (ref 19–32)
Calcium: 8.7 mg/dL (ref 8.4–10.5)
Chloride: 101 mEq/L (ref 96–112)
Creatinine, Ser: 1.11 mg/dL (ref 0.50–1.35)
GFR, EST AFRICAN AMERICAN: 90 mL/min — AB (ref >90)
GFR, EST NON AFRICAN AMERICAN: 77 mL/min — AB (ref >90)
GLUCOSE: 86 mg/dL (ref 70–99)
POTASSIUM: 4.5 meq/L (ref 3.7–5.3)
Sodium: 139 mEq/L (ref 137–147)

## 2013-07-30 LAB — BLOOD GAS, ARTERIAL
Acid-Base Excess: 0.1 mmol/L (ref 0.0–2.0)
BICARBONATE: 25 meq/L — AB (ref 20.0–24.0)
DRAWN BY: 31814
FIO2: 0.21 %
O2 Saturation: 93.5 %
PCO2 ART: 44.1 mmHg (ref 35.0–45.0)
PO2 ART: 72.8 mmHg — AB (ref 80.0–100.0)
Patient temperature: 98.5
TCO2: 22.6 mmol/L (ref 0–100)
pH, Arterial: 7.372 (ref 7.350–7.450)

## 2013-07-30 LAB — CBC
HCT: 37.2 % — ABNORMAL LOW (ref 39.0–52.0)
Hemoglobin: 12.3 g/dL — ABNORMAL LOW (ref 13.0–17.0)
MCH: 28 pg (ref 26.0–34.0)
MCHC: 33.1 g/dL (ref 30.0–36.0)
MCV: 84.7 fL (ref 78.0–100.0)
PLATELETS: 307 10*3/uL (ref 150–400)
RBC: 4.39 MIL/uL (ref 4.22–5.81)
RDW: 15.1 % (ref 11.5–15.5)
WBC: 5.4 10*3/uL (ref 4.0–10.5)

## 2013-07-30 LAB — MRSA PCR SCREENING: MRSA BY PCR: NEGATIVE

## 2013-07-30 LAB — HEMOGLOBIN AND HEMATOCRIT, BLOOD
HCT: 40.7 % (ref 39.0–52.0)
HCT: 39.6 % (ref 39.0–52.0)
HEMOGLOBIN: 13.4 g/dL (ref 13.0–17.0)
HEMOGLOBIN: 13.1 g/dL (ref 13.0–17.0)

## 2013-07-30 LAB — CBG MONITORING, ED: Glucose-Capillary: 99 mg/dL (ref 70–99)

## 2013-07-30 SURGERY — ESOPHAGOGASTRODUODENOSCOPY (EGD) WITH PROPOFOL
Anesthesia: Monitor Anesthesia Care

## 2013-07-30 MED ORDER — SODIUM CHLORIDE 0.9 % IV SOLN
INTRAVENOUS | Status: DC
  Administered 2013-07-30: 21:00:00 via INTRAVENOUS

## 2013-07-30 MED ORDER — PROPOFOL 10 MG/ML IV BOLUS
INTRAVENOUS | Status: AC
  Filled 2013-07-30: qty 20

## 2013-07-30 MED ORDER — OXYCODONE HCL 5 MG PO TABS: 5.0000 mg | ORAL_TABLET | ORAL | Status: DC | PRN

## 2013-07-30 MED ORDER — ALUM & MAG HYDROXIDE-SIMETH 200-200-20 MG/5ML PO SUSP: 30.0000 mL | Freq: Four times a day (QID) | ORAL | Status: DC | PRN

## 2013-07-30 MED ORDER — ONDANSETRON HCL 4 MG/2ML IJ SOLN: 4.0000 mg | Freq: Four times a day (QID) | INTRAMUSCULAR | Status: DC | PRN

## 2013-07-30 MED ORDER — ACETAMINOPHEN 325 MG PO TABS: 650.0000 mg | ORAL_TABLET | Freq: Four times a day (QID) | ORAL | Status: DC | PRN

## 2013-07-30 MED ORDER — BUTAMBEN-TETRACAINE-BENZOCAINE 2-2-14 % EX AERO
INHALATION_SPRAY | CUTANEOUS | Status: DC | PRN
  Administered 2013-07-30: 2 via TOPICAL

## 2013-07-30 MED ORDER — PROPOFOL 10 MG/ML IV EMUL
INTRAVENOUS | Status: DC | PRN
  Administered 2013-07-30: 40 mg via INTRAVENOUS

## 2013-07-30 MED ORDER — ACETAMINOPHEN 650 MG RE SUPP: 650.0000 mg | Freq: Four times a day (QID) | RECTAL | Status: DC | PRN

## 2013-07-30 MED ORDER — PROPOFOL INFUSION 10 MG/ML OPTIME
INTRAVENOUS | Status: DC | PRN
  Administered 2013-07-30: 160 ug/kg/min via INTRAVENOUS

## 2013-07-30 MED ORDER — SODIUM CHLORIDE 0.9 % IV SOLN
INTRAVENOUS | Status: DC
  Administered 2013-07-30: 03:00:00 via INTRAVENOUS

## 2013-07-30 MED ORDER — LACTATED RINGERS IV SOLN
INTRAVENOUS | Status: DC | PRN
  Administered 2013-07-30: 09:00:00 via INTRAVENOUS

## 2013-07-30 MED ORDER — ONDANSETRON HCL 4 MG/2ML IJ SOLN: 4.0000 mg | Freq: Three times a day (TID) | INTRAMUSCULAR | Status: DC | PRN

## 2013-07-30 MED ORDER — LIDOCAINE HCL (CARDIAC) 20 MG/ML IV SOLN
INTRAVENOUS | Status: AC
  Filled 2013-07-30: qty 5

## 2013-07-30 MED ORDER — SODIUM CHLORIDE 0.9 % IV SOLN
8.0000 mg/h | INTRAVENOUS | Status: DC
  Administered 2013-07-30: 8 mg/h via INTRAVENOUS
  Filled 2013-07-30 (×5): qty 80

## 2013-07-30 MED ORDER — SODIUM CHLORIDE 0.9 % IV SOLN: INTRAVENOUS | Status: DC

## 2013-07-30 MED ORDER — ONDANSETRON HCL 4 MG PO TABS: 4.0000 mg | ORAL_TABLET | Freq: Four times a day (QID) | ORAL | Status: DC | PRN

## 2013-07-30 MED ORDER — LACTATED RINGERS IV SOLN
INTRAVENOUS | Status: DC
  Administered 2013-07-30: 09:00:00 via INTRAVENOUS

## 2013-07-30 MED ORDER — HYDROMORPHONE HCL PF 1 MG/ML IJ SOLN: 0.5000 mg | INTRAMUSCULAR | Status: DC | PRN

## 2013-07-30 MED ORDER — MIDAZOLAM HCL 5 MG/5ML IJ SOLN
INTRAMUSCULAR | Status: DC | PRN
  Administered 2013-07-30: 2 mg via INTRAVENOUS

## 2013-07-30 MED ORDER — PANTOPRAZOLE SODIUM 40 MG IV SOLR: 40.0000 mg | Freq: Two times a day (BID) | INTRAVENOUS | Status: DC

## 2013-07-30 MED ORDER — SODIUM CHLORIDE 0.9 % IV SOLN
80.0000 mg | Freq: Once | INTRAVENOUS | Status: AC
  Administered 2013-07-30: 80 mg via INTRAVENOUS
  Filled 2013-07-30: qty 80

## 2013-07-30 SURGICAL SUPPLY — 14 items

## 2013-07-30 NOTE — ED Notes (Signed)
Dr. Jenkins at bedside. 

## 2013-07-30 NOTE — Anesthesia Preprocedure Evaluation (Addendum)
Anesthesia Evaluation  Patient identified by MRN, date of birth, ID band Patient awake    Reviewed: Allergy & Precautions, H&P , NPO status , Patient's Chart, lab work & pertinent test results  Airway Mallampati: II TM Distance: >3 FB Neck ROM: Full    Dental no notable dental hx.    Pulmonary Current Smoker,  breath sounds clear to auscultation  Pulmonary exam normal       Cardiovascular negative cardio ROS  Rhythm:Regular Rate:Normal     Neuro/Psych PSYCHIATRIC DISORDERS Depression Schizophrenia negative neurological ROS     GI/Hepatic GERD-  Medicated,(+)     substance abuse  alcohol use and cocaine use,   Endo/Other  negative endocrine ROS  Renal/GU negative Renal ROS  negative genitourinary   Musculoskeletal negative musculoskeletal ROS (+)   Abdominal   Peds negative pediatric ROS (+)  Hematology negative hematology ROS (+)   Anesthesia Other Findings   Reproductive/Obstetrics negative OB ROS                          Anesthesia Physical Anesthesia Plan  ASA: III  Anesthesia Plan: MAC   Post-op Pain Management:    Induction: Intravenous  Airway Management Planned:   Additional Equipment:   Intra-op Plan:   Post-operative Plan:   Informed Consent: I have reviewed the patients History and Physical, chart, labs and discussed the procedure including the risks, benefits and alternatives for the proposed anesthesia with the patient or authorized representative who has indicated his/her understanding and acceptance.   Dental advisory given  Plan Discussed with: CRNA  Anesthesia Plan Comments:         Anesthesia Quick Evaluation

## 2013-07-30 NOTE — ED Notes (Signed)
Pt now being upgraded to Step Down, flow manager and 5E aware

## 2013-07-30 NOTE — Op Note (Signed)
[facility] [address] [place], [id number]   ENDOSCOPY PROCEDURE REPORT  PATIENT: Lonnie [name], [name]  MR#: [id number] BIRTHDATE: 10/17/1965 , 48  yrs. old GENDER: Male ENDOSCOPIST:Hser Belanger Madilyn FiremanHayes, MD REFERRED BY: PROCEDURE DATE:  07/30/2013 PROCEDURE: ASA CLASS: INDICATIONS:  hematemesis MEDICATION:   propofol TOPICAL ANESTHETIC:    Cetacaine  DESCRIPTION OF PROCEDURE:   esophagus: Normal except for irregular Z line and small hiatal hernia no Mallory-Weiss tear or esophageal varices  Stomach: Moderate granularity and some nodularity consistent with nonspecific gastritis. No stigmata of hemorrhage, no old blood in the stomach. One 3 mm clean-based ulcer on the angularis without any stigmata of hemorrhage.  Duodenum: Normal     COMPLICATIONS: None  ENDOSCOPIC IMPRESSION:#1 very small prepyloric ulcer #2 mild gastritis #3 minimal hiatal hernia #4 no high-risk bleeding lesions seen nor any old or fresh blood in the upper GI tract   RECOMMENDATIONS:Treat with proton pump inhibitor for 6-8 weeks and await CLOtest and treat for eradication of Helicobacter if positive. Will advance diet.    _______________________________ Barnet GlasgoweSigned:  Juliana Boling, MD 07/30/2013 10:05 AM

## 2013-07-30 NOTE — Consult Note (Signed)
[facility] Consult Note  Referring Provider: No ref. provider found Primary Care Physician:  No PCP Per Patient Primary Gastroenterologist:  Dr.  Laurel Dimmer Complaint: Vomiting blood HPI: Lonnie [name] is an 48 y.o. black male  with history of polysubstance abuse schizophrenia and depression and some alcohol abuse who is in a detox facility when he suddenly began having hematemesis described as bright red blood on 4 occasions. He describes mild epigastric pain. He has not had any vomiting since he arrived and has not had any stools. He has no prior history of GI bleeding. He states he drank alcohol about 2 quarts a day up until about 2 weeks ago. Past Medical History  Diagnosis Date  . GERD (gastroesophageal reflux disease)   . Depression   . Lumbago   . ETOH abuse   . Cocaine abuse   . Xanax use disorder, mild, abuse   . Marijuana abuse   . Schizophrenia     Past Surgical History  Procedure Laterality Date  . Back surgery    . Neck surgery      Medications Prior to Admission  Medication Sig Dispense Refill  . cyclobenzaprine (FLEXERIL) 10 MG tablet Take 1 tablet (10 mg total) by mouth 3 (three) times daily.  90 tablet  0  . doxycycline (VIBRA-TABS) 100 MG tablet Take 1 tablet (100 mg total) by mouth every 12 (twelve) hours.  20 tablet  0  . HYDROcodone-acetaminophen (NORCO/VICODIN) 5-325 MG per tablet Take 1 tablet by mouth every 6 (six) hours as needed for moderate pain.  10 tablet  0  . lidocaine (LIDODERM) 5 % Place 1 patch onto the skin daily. Remove & Discard patch within 12 hours or as directed by MD  7 patch  0  . mupirocin cream (BACTROBAN) 2 % Apply topically 2 (two) times daily.  15 g  0  . prazosin (MINIPRESS) 1 MG capsule Take 1 capsule (1 mg total) by mouth at bedtime.  30 capsule  0  . risperidone (RISPERDAL) 4 MG tablet Take 4 mg by mouth at bedtime.       Marland Kitchen FLUoxetine (PROZAC) 20 MG capsule Take 1 capsule (20 mg total) by mouth daily.  30 capsule  0     Allergies:  Allergies  Allergen Reactions  . Ibuprofen Itching    Family History  Problem Relation Age of Onset  . Diabetes Mother   . Hypertension Mother   . Hyperlipidemia Father   . Heart attack Neg Hx   . Sudden death Neg Hx     Social History:  reports that he has been smoking Cigarettes.  He has a 7.5 pack-year smoking history. He has never used smokeless tobacco. He reports that he drinks alcohol. He reports that he uses illicit drugs (Cocaine and Marijuana).  Review of Systems: negative except as above   Blood pressure 115/77, pulse 75, temperature 98.6 F (37 C), temperature source Oral, resp. rate 16, height 5' 11"  (1.803 m), weight 86.2 kg (190 lb 0.6 oz), SpO2 94.00%. Head: Normocephalic, without obvious abnormality, atraumatic Neck: no adenopathy, no carotid bruit, no JVD, supple, symmetrical, trachea midline and thyroid not enlarged, symmetric, no tenderness/mass/nodules Resp: clear to auscultation bilaterally Cardio: regular rate and rhythm, S1, S2 normal, no murmur, click, rub or gallop GI: Abdomen soft nondistended with normoactive bowel sounds. No hepatomegaly masses or guarding the Extremities: extremities normal, atraumatic, no cyanosis or edema  Results for orders placed during the hospital encounter of 07/30/13 (from the past 48  hour(s))  BASIC METABOLIC PANEL     Status: Abnormal   Collection Time    07/30/13  3:48 AM      Result Value Ref Range   Sodium 139  137 - 147 mEq/L   Potassium 4.5  3.7 - 5.3 mEq/L   Chloride 101  96 - 112 mEq/L   CO2 26  19 - 32 mEq/L   Glucose, Bld 86  70 - 99 mg/dL   BUN 17  6 - 23 mg/dL   Creatinine, Ser 1.11  0.50 - 1.35 mg/dL   Calcium 8.7  8.4 - 10.5 mg/dL   GFR calc non Af Amer 77 (*) >90 mL/min   GFR calc Af Amer 90 (*) >90 mL/min   Comment: (NOTE)     The eGFR has been calculated using the CKD EPI equation.     This calculation has not been validated in all clinical situations.     eGFR's persistently <90  mL/min signify possible Chronic Kidney     Disease.   Anion gap 12  5 - 15  CBC     Status: Abnormal   Collection Time    07/30/13  3:48 AM      Result Value Ref Range   WBC 5.4  4.0 - 10.5 K/uL   RBC 4.39  4.22 - 5.81 MIL/uL   Hemoglobin 12.3 (*) 13.0 - 17.0 g/dL   HCT 37.2 (*) 39.0 - 52.0 %   MCV 84.7  78.0 - 100.0 fL   MCH 28.0  26.0 - 34.0 pg   MCHC 33.1  30.0 - 36.0 g/dL   RDW 15.1  11.5 - 15.5 %   Platelets 307  150 - 400 K/uL   Dg Abd 1 View  07/30/2013   CLINICAL DATA:  Abdominal pain.  Hematemesis.  EXAM: ABDOMEN - 1 VIEW  COMPARISON:  None.  FINDINGS: No evidence of dilated bowel loops. Large stool burden seen throughout the colon. No evidence of free air. No radiopaque calculi identified. Pelvic vascular calcification noted.  IMPRESSION: No acute findings.  Large stool burden noted; suggest clinical correlation for possible constipation.   Electronically Signed   By: Earle Gell M.D.   On: 07/30/2013 01:13    Assessment: 1. Upper GI bleeding, probably Mallory-Weiss tear versus peptic ulcer disease less likely esophageal varices  Plan:  1. IV proton pump inhibitor 2. Proceed with upper endoscopy this morning under propofol sedation. YIAXK,PVVZ C 07/30/2013, 8:26 AM

## 2013-07-30 NOTE — Transfer of Care (Signed)
Immediate Anesthesia Transfer of Care Note  Patient: Lonnie [name]  Procedure(s) Performed: Procedure(s): ESOPHAGOGASTRODUODENOSCOPY (EGD) WITH PROPOFOL (N/A)  Patient Location: PACU  Anesthesia Type:MAC  Level of Consciousness: awake, alert  and oriented  Airway & Oxygen Therapy: Patient Spontanous Breathing and Patient connected to nasal cannula oxygen  Post-op Assessment: Report given to PACU RN and Post -op Vital signs reviewed and stable  Post vital signs: Reviewed and stable  Complications: No apparent anesthesia complications

## 2013-07-30 NOTE — Progress Notes (Signed)
[facility] PROGRESS NOTE  Apolinar JunesHoward J Hafen ZOX:[id number] DOB: 10/19/1965 DOA: 07/30/2013 PCP: No PCP Per Patient  Assessment/Plan: 1. Upper GI Bleed -Currently hemodynamically stable, has not had further episodes of GI bleed. -Possibilities include ulcers/gastritis vs bleeding varices with his history of ETOH and substance abuse -Continue Protonix gtt -Continue to monitor H&H -GI consulted  2. Polysubstance Abuse -Does not have clinical signs or symptoms of withdrawal  3. Schizoaffective Disorder -Stable, [facility]   Code Status: Full Code Family Communication:  Disposition Plan: GI consultation   Consultants:  GI   HPI/Subjective: Patient is pleasant [age] year with a past medical history of polysubstance Abuse, Scizophernia, Major Depression and Lumbago, admitted overnight with complaints of hematemesis x4 [facility]. He has not had further episodes of hematemesis.    Objective: Filed Vitals:   07/30/13 0435  BP: 115/77  Pulse: 75  Temp:   Resp: 16    Intake/Output Summary (Last 24 hours) at 07/30/13 0736 Last data filed at 07/30/13 0454  Gross per 24 hour  Intake 187.92 ml  Output    550 ml  Net -362.08 ml   Filed Weights   07/30/13 0219  Weight: 86.2 kg (190 lb 0.6 oz)    Exam:   General:  He is in no acute distress, awake and alert, no further episodes of hematemesis  Cardiovascular: Regular rate rate and rhythm  Respiratory: Clear to auscultation bilaterally  Abdomen: Soft, mild tenderness of palpation over epigastric region  Musculoskeletal: No edema  Data Reviewed: Basic Metabolic Panel:  Recent Labs Lab 07/24/13 1056 07/29/13 2119 07/29/13 2255 07/30/13 0348  NA 141 137  --  139  K 3.8 5.5* 5.3 4.5  CL 100 101  --  101  CO2 21 25  --  26  GLUCOSE 97 116*  --  86  BUN 10 18  --  17  CREATININE 1.10 1.13  --  1.11  CALCIUM 9.2 9.6  --  8.7   Liver Function Tests:  Recent Labs Lab  07/24/13 1056 07/29/13 2119  AST 30 47*  ALT 38 42  ALKPHOS 53 51  BILITOT 1.0 <0.2*  PROT 7.5 7.1  ALBUMIN 4.1 3.4*   No results found for this basename: LIPASE, AMYLASE,  in the last 168 hours No results found for this basename: AMMONIA,  in the last 168 hours CBC:  Recent Labs Lab 07/24/13 1056 07/29/13 2119 07/30/13 0348  WBC 8.6 5.8 5.4  HGB 14.2 13.0 12.3*  HCT 42.1 38.9* 37.2*  MCV 86.3 85.1 84.7  PLT 352 295 307   Cardiac Enzymes:  Recent Labs Lab 07/29/13 2119  TROPONINI <0.30   BNP (last 3 results) No results found for this basename: PROBNP,  in the last 8760 hours CBG:  Recent Labs Lab 07/30/13 0056  GLUCAP 99    Recent Results (from the past 240 hour(s))  MRSA PCR SCREENING     Status: None   Collection Time    07/30/13  1:55 AM      Result Value Ref Range Status   MRSA by PCR NEGATIVE  NEGATIVE Final   Comment:            The GeneXpert MRSA Assay (FDA     approved for NASAL specimens     only), is one component of a     comprehensive MRSA colonization     surveillance program. It is not     intended to diagnose MRSA  infection nor to guide or     monitor treatment for     MRSA infections.     Studies: Dg Abd 1 View  07/30/2013   CLINICAL DATA:  Abdominal pain.  Hematemesis.  EXAM: ABDOMEN - 1 VIEW  COMPARISON:  None.  FINDINGS: No evidence of dilated bowel loops. Large stool burden seen throughout the colon. No evidence of free air. No radiopaque calculi identified. Pelvic vascular calcification noted.  IMPRESSION: No acute findings.  Large stool burden noted; suggest clinical correlation for possible constipation.   Electronically Signed   By: Myles RosenthalJohn  Stahl M.D.   On: 07/30/2013 01:13    Scheduled Meds: . [START ON 08/02/2013] pantoprazole (PROTONIX) IV  40 mg Intravenous Q12H   Continuous Infusions: . sodium chloride 100 mL/hr at 07/30/13 0323  . pantoprozole (PROTONIX) infusion 8 mg/hr (07/30/13 0327)    Active Problems:   GI  bleed    Time spent: 25    Jeralyn BennettZAMORA, Shericka Johnstone  [facility] Pager [phone]. If 7PM-7AM, please contact night-coverage at [email or web address], password [email or web address] 07/30/2013, 7:36 AM  LOS: 0 days

## 2013-07-30 NOTE — H&P (Signed)
[facility] History and Physical  Lonnie JunesHoward J [name] ZOX:[id number] DOB: 11/11/1965 DOA: 07/24/2013  Referring physician:  EDP PCP: No PCP Per Patient  Specialists:   Chief Complaint:   HPI: Lonnie JunesHoward J Doucet is a 48 y.o. male with a history of Polysubstance Abuse, Scizophernia, Major Depression and Lumbago who [facility] and was having no difficulties until this afternoon when he began to have 4 episodes of hematemesis.  He reports vomiting bright red blood at first then became brownish.  He also has epigastric ABD pain.   He denies having any melena or fever or chills.   He denies having any similar previous episodes.   He has not had any Alcohol or Illicit nonprescribed substances since [facility].       Review of Systems:  Constitutional: No Weight Loss, No Weight Gain, Night Sweats, Fevers, Chills, Fatigue, or Generalized Weakness HEENT: No Headaches, Difficulty Swallowing,Tooth/Dental Problems,Sore Throat,  No Sneezing, Rhinitis, Ear Ache, Nasal Congestion, or Post Nasal Drip,  Cardio-vascular:  No Chest pain, Orthopnea, PND, Edema in lower extremities, Anasarca, Dizziness, Palpitations  Resp: No Dyspnea, No DOE, No Cough, No Hemoptysis, No Wheezing.    GI: No Heartburn, Indigestion, +Abdominal Pain, +Nausea, +Vomiting, +Hematemesis, No Melena, No Hematochezia,  Diarrhea, Change in Bowel Habits,  Loss of Appetite  GU: No Dysuria, Change in Color of Urine, No Urgency or Frequency.  No flank pain.  Musculoskeletal: No Joint Pain or Swelling.  No Decreased Range of Motion. +Back Pain.  Neurologic: No Syncope, No Seizures, Muscle Weakness, Paresthesia, Vision Disturbance or Loss, No Diplopia, No Vertigo, No Difficulty Walking,  Skin: No Rash or Lesions. Psych: No Change in Mood or Affect. No Depression or Anxiety. No Memory loss. No Confusion or Hallucinations   Past Medical History  Diagnosis Date  . GERD (gastroesophageal reflux disease)   .  Depression     Past Surgical History  Procedure Laterality Date  . Back surgery    . Neck surgery         Prior to Admission medications   Medication Sig Start Date End Date Taking? Authorizing Provider  cyclobenzaprine (FLEXERIL) 10 MG tablet Take 1 tablet (10 mg total) by mouth 3 (three) times daily. 07/29/13   Beau FannyJohn C Withrow, FNP  doxycycline (VIBRA-TABS) 100 MG tablet Take 1 tablet (100 mg total) by mouth every 12 (twelve) hours. 07/29/13   Beau FannyJohn C Withrow, FNP  FLUoxetine (PROZAC) 20 MG capsule Take 1 capsule (20 mg total) by mouth daily. 07/29/13   Beau FannyJohn C Withrow, FNP  FLUoxetine (PROZAC) 40 MG capsule Take 40 mg by mouth daily.  05/23/13 05/23/14  Historical Provider, MD  HYDROcodone-acetaminophen (NORCO/VICODIN) 5-325 MG per tablet Take 1 tablet by mouth every 6 (six) hours as needed for moderate pain. 07/29/13   Beau FannyJohn C Withrow, FNP  lidocaine (LIDODERM) 5 % Place 1 patch onto the skin daily. Remove & Discard patch within 12 hours or as directed by MD 07/29/13   Beau FannyJohn C Withrow, FNP  mupirocin cream (BACTROBAN) 2 % Apply topically 2 (two) times daily. 07/29/13   Beau FannyJohn C Withrow, FNP  prazosin (MINIPRESS) 1 MG capsule Take 1 capsule (1 mg total) by mouth at bedtime. 07/29/13 07/29/14  Beau FannyJohn C Withrow, FNP  risperidone (RISPERDAL) 4 MG tablet Take 4 mg by mouth daily.    Historical Provider, MD  risperiDONE (RISPERDAL) 4 MG tablet Take 1 tablet (4 mg total) by mouth at bedtime. 07/29/13   Jonny RuizJohn  Chipper Herb Withrow, FNP      Allergies  Allergen Reactions  . Ibuprofen Itching     Social History:  reports that he has been smoking Cigarettes.  He has a 7.5 pack-year smoking history. He has never used smokeless tobacco. He reports that he drinks alcohol. He reports that he uses illicit drugs (Cocaine and Marijuana).     Family History  Problem Relation Age of Onset  . Diabetes Mother   . Hypertension Mother   . Hyperlipidemia Father   . Heart attack Neg Hx   . Sudden death Neg Hx        Physical  Exam:  GEN:  Obtunded Well Nourished and Well Developed  48 y.o. African American male examined and in no acute distress; cooperative with exam Filed Vitals:   07/29/13 2117 07/29/13 2342 07/29/13 2345 07/30/13 0056  BP: 129/83 122/75 119/78 116/76  Pulse: 93 79 91   Temp: 98.4 F (36.9 C) 98.5 F (36.9 C)    TempSrc: Oral Oral    Resp: 18 17 24    Height:      Weight:      SpO2: 96% 97% 100%    Blood pressure 116/76, pulse 91, temperature 98.5 F (36.9 C), temperature source Oral, resp. rate 24, height 5\' 10"  (1.778 m), weight 83.915 kg (185 lb), SpO2 100.00%. PSYCH: He is alert and oriented x4; does not appear anxious does not appear depressed; affect is normal HEENT: Normocephalic and Atraumatic, Mucous membranes pink; PERRLA; EOM intact; Fundi:  Benign;  No scleral icterus, Nares: Patent, Oropharynx: Clear, Fair Dentition, Neck:  FROM, no cervical lymphadenopathy nor thyromegaly or carotid bruit; no JVD; Breasts:: Not examined CHEST WALL: No tenderness CHEST: Normal respiration, clear to auscultation bilaterally HEART: Regular rate and rhythm; no murmurs rubs or gallops BACK: No kyphosis or scoliosis; no CVA tenderness ABDOMEN: Positive Bowel Sounds,  soft non-tender; no masses, no organomegaly. Rectal Exam: Not done EXTREMITIES: No cyanosis, clubbing or edema; no ulcerations. Genitalia: not examined PULSES: 2+ and symmetric SKIN: Normal hydration no rash or ulceration CNS:  Alert X Oriented x 4, Lethargic, but No Focal Deficits Vascular: pulses palpable throughout    Labs on Admission:  Basic Metabolic Panel:  Recent Labs Lab 07/24/13 1056 07/29/13 2119 07/29/13 2255  NA 141 137  --   K 3.8 5.5* 5.3  CL 100 101  --   CO2 21 25  --   GLUCOSE 97 116*  --   BUN 10 18  --   CREATININE 1.10 1.13  --   CALCIUM 9.2 9.6  --    Liver Function Tests:  Recent Labs Lab 07/24/13 1056 07/29/13 2119  AST 30 47*  ALT 38 42  ALKPHOS 53 51  BILITOT 1.0 <0.2*  PROT 7.5  7.1  ALBUMIN 4.1 3.4*   No results found for this basename: LIPASE, AMYLASE,  in the last 168 hours No results found for this basename: AMMONIA,  in the last 168 hours CBC:  Recent Labs Lab 07/24/13 1056 07/29/13 2119  WBC 8.6 5.8  HGB 14.2 13.0  HCT 42.1 38.9*  MCV 86.3 85.1  PLT 352 295   Cardiac Enzymes:  Recent Labs Lab 07/29/13 2119  TROPONINI <0.30    BNP (last 3 results) No results found for this basename: PROBNP,  in the last 8760 hours CBG: No results found for this basename: GLUCAP,  in the last 168 hours  Radiological Exams on Admission: Dg Abd 1 View  07/30/2013   CLINICAL  DATA:  Abdominal pain.  Hematemesis.  EXAM: ABDOMEN - 1 VIEW  COMPARISON:  None.  FINDINGS: No evidence of dilated bowel loops. Large stool burden seen throughout the colon. No evidence of free air. No radiopaque calculi identified. Pelvic vascular calcification noted.  IMPRESSION: No acute findings.  Large stool burden noted; suggest clinical correlation for possible constipation.   Electronically Signed   By: Myles Rosenthal M.D.   On: 07/30/2013 01:13       Assessment/Plan:  48 y.o. male with  Principal Problem:   GI bleed Active Problems:   Hematemesis   Chronic low back pain   Alcohol dependence with alcohol-induced mood disorder   Major depression, recurrent   Cocaine dependence   Schizoaffective disorder    1..   GI Bleed/Hematemesis/ ABD Pain-   Monitor H/Hs,  Transfuse if needed.  NPO except for ICE Chips,  IVFs, IV Protonix, Please consult GI in AM.   Pain control PRN.    2.   Polysubstance Abuse- ETOh Hx, Cocaine Hx, MArijuana, Xanax  Abuse-   Was admiitted to [facility] for Detox, out of window for ETOH Withdrawal.   Monitor.    3.   Chronic Low Back Pain-   Pain Control PRN.    4.   Depression and Schizoaffective Disorder-   Followed by Psych at [facility].    5.   DVT prophylaxis with SCDs.     Code Status:   FULL CODE Family Communication:   No Family Present Disposition Plan:     Inpatient      Time spent:  41 Minutes  Ron Parker [facility] Pager [phone]  If 7PM-7AM, please contact night-coverage [email or web address] Password [email or web address] 07/30/2013, 1:29 AM

## 2013-07-30 NOTE — Anesthesia Postprocedure Evaluation (Signed)
  Anesthesia Post-op Note  Patient: Lonnie [name]  Procedure(s) Performed: Procedure(s) (LRB): ESOPHAGOGASTRODUODENOSCOPY (EGD) WITH PROPOFOL (N/A)  Patient Location: PACU  Anesthesia Type: MAC  Level of Consciousness: awake and alert   Airway and Oxygen Therapy: Patient Spontanous Breathing  Post-op Pain: mild  Post-op Assessment: Post-op Vital signs reviewed, Patient's Cardiovascular Status Stable, Respiratory Function Stable, Patent Airway and No signs of Nausea or vomiting  Last Vitals:  Filed Vitals:   07/30/13 1020  BP: 125/86  Pulse: 80  Temp:   Resp: 26    Post-op Vital Signs: stable   Complications: No apparent anesthesia complications

## 2013-07-30 NOTE — ED Provider Notes (Signed)
Medical screening examination/treatment/procedure(s) were conducted as a shared visit with non-physician practitioner(s) and myself.  I personally evaluated the patient during the encounter.   EKG Interpretation   Date/Time:  Sunday July 29 2013 21:23:43 EDT Ventricular Rate:  90 PR Interval:  137 QRS Duration: 75 QT Interval:  347 QTC Calculation: 424 R Axis:   60 Text Interpretation:  Sinus rhythm ST elev, probable normal early repol  pattern No significant change since last tracing Confirmed by [facility]  MD,  Francille Wittmann [phone]([id number]) on 07/29/2013 11:14:30 PM     Pt presenting with c/o vomiting of bright red blood.  No further emesis in the ED.   Pt is awake, alert, NAD, abdomen nontender.  Vitals are stable.  Pt to be admitted to [facility] for further observation and management.    Ethelda ChickMartha K Linker, MD 07/30/13 [phone]

## 2013-07-30 NOTE — Progress Notes (Signed)
Addendum: EGD done. Tiny prepyloric ulcer with moderate proximal gastritis no stigmata of hemorrhage no esophageal varices no high-risk bleeding lesions seen no blood seen anywhere in the stomach duodenum or esophagus. Would treat with proton pump inhibitor for 6 weeks and await CLOtest for H. pylori.

## 2013-07-30 NOTE — Progress Notes (Signed)
Patient Discharge Instructions:  Next Level Care Provider Has Access to the EMR, 07/30/13 [facility]  The entire medical record is made available via CHL/Epic access.   Jerelene ReddenSheena E Helenwood, 07/30/2013, 1:50 PM

## 2013-07-30 NOTE — H&P (Signed)
[facility]  History and Physical    Lonnie [name] ZOX:[id number] DOB: [birth year] DOA: 07/24/2013  Referring physician: EDP  PCP: No PCP Per Patient  Specialists:   Chief Complaint:  Vomiting Blood   HPI: Lonnie [name] is a 48 y.o. male with a history of Polysubstance Abuse, Scizophernia, Major Depression and Lumbago who was admitted to [facility] for Detox on 06/30 and was having no difficulties until this afternoon when he began to have 4 episodes of hematemesis. He reports vomiting bright red blood at first then became brownish. He also has epigastric ABD pain. He denies having any melena or fever or chills. He denies having any similar previous episodes. He has not had any Alcohol or Illicit nonprescribed substances since his admission to [facility].  Review of Systems:  Constitutional: No Weight Loss, No Weight Gain, Night Sweats, Fevers, Chills, Fatigue, or Generalized Weakness  HEENT: No Headaches, Difficulty Swallowing,Tooth/Dental Problems,Sore Throat,  No Sneezing, Rhinitis, Ear Ache, Nasal Congestion, or Post Nasal Drip,  Cardio-vascular: No Chest pain, Orthopnea, PND, Edema in lower extremities, Anasarca, Dizziness, Palpitations  Resp: No Dyspnea, No DOE, No Cough, No Hemoptysis, No Wheezing.  GI: No Heartburn, Indigestion, +Abdominal Pain, +Nausea, +Vomiting, +Hematemesis, No Melena, No Hematochezia, Diarrhea, Change in Bowel Habits, Loss of Appetite  GU: No Dysuria, Change in Color of Urine, No Urgency or Frequency. No flank pain.  Musculoskeletal: No Joint Pain or Swelling. No Decreased Range of Motion. +Back Pain.  Neurologic: No Syncope, No Seizures, Muscle Weakness, Paresthesia, Vision Disturbance or Loss, No Diplopia, No Vertigo, No Difficulty Walking,  Skin: No Rash or Lesions.  Psych: No Change in Mood or Affect. No Depression or Anxiety. No Memory loss. No Confusion or Hallucinations    Past Medical History   Diagnosis  Date   .  GERD (gastroesophageal reflux disease)     .  Depression      Past Surgical History   Procedure  Laterality  Date   .  Back surgery     .  Neck surgery       Prior to Admission medications   Medication  Sig  Start Date  End Date  Taking?  Authorizing Provider   cyclobenzaprine (FLEXERIL) 10 MG tablet  Take 1 tablet (10 mg total) by mouth 3 (three) times daily.  07/29/13    Beau Fanny, FNP   doxycycline (VIBRA-TABS) 100 MG tablet  Take 1 tablet (100 mg total) by mouth every 12 (twelve) hours.  07/29/13    Beau Fanny, FNP   FLUoxetine (PROZAC) 20 MG capsule  Take 1 capsule (20 mg total) by mouth daily.  07/29/13    Beau Fanny, FNP   FLUoxetine (PROZAC) 40 MG capsule  Take 40 mg by mouth daily.  05/23/13  05/23/14   Historical Provider, MD   HYDROcodone-acetaminophen (NORCO/VICODIN) 5-325 MG per tablet  Take 1 tablet by mouth every 6 (six) hours as needed for moderate pain.  07/29/13    Beau Fanny, FNP   lidocaine (LIDODERM) 5 %  Place 1 patch onto the skin daily. Remove & Discard patch within 12 hours or as directed by MD  07/29/13    Beau Fanny, FNP   mupirocin cream (BACTROBAN) 2 %  Apply topically 2 (two) times daily.  07/29/13    Beau Fanny, FNP   prazosin (MINIPRESS) 1 MG capsule  Take 1 capsule (1 mg total) by mouth at bedtime.  07/29/13  07/29/14   Everardo All Withrow,  FNP   risperidone (RISPERDAL) 4 MG tablet  Take 4 mg by mouth daily.     Historical Provider, MD   risperiDONE (RISPERDAL) 4 MG tablet  Take 1 tablet (4 mg total) by mouth at bedtime.  07/29/13    Beau FannyJohn C Withrow, FNP     Allergies   Allergen  Reactions   .  Ibuprofen  Itching     Social History: reports that he has been smoking Cigarettes. He has a 7.5 pack-year smoking history. He has never used smokeless tobacco. He reports that he drinks alcohol. He reports that he uses illicit drugs (Cocaine and Marijuana).   Family History   Problem  Relation  Age of Onset   .  Diabetes  Mother    .  Hypertension  Mother    .  Hyperlipidemia  Father    .  Heart  attack  Neg Hx    .  Sudden death  Neg Hx      Physical Exam:  GEN: Obtunded Well Nourished and Well Developed 48 y.o. African American male examined and in no acute distress; cooperative with exam  Filed Vitals:    07/29/13 2117  07/29/13 2342  07/29/13 2345  07/30/13 0056   BP:  129/83  122/75  119/78  116/76   Pulse:  93  79  91    Temp:  98.4 F (36.9 C)  98.5 F (36.9 C)     TempSrc:  Oral  Oral     Resp:  18  17  24     Height:       Weight:       SpO2:  96%  97%  100%     Blood pressure 116/76, pulse 91, temperature 98.5 F (36.9 C), temperature source Oral, resp. rate 24, height 5\' 10"  (1.778 m), weight 83.915 kg (185 lb), SpO2 100.00%.  PSYCH: He is alert and oriented x4; does not appear anxious does not appear depressed; affect is normal  HEENT: Normocephalic and Atraumatic, Mucous membranes pink; PERRLA; EOM intact; Fundi: Benign; No scleral icterus, Nares: Patent, Oropharynx: Clear, Fair Dentition, Neck: FROM, no cervical lymphadenopathy nor thyromegaly or carotid bruit; no JVD;  Breasts:: Not examined  CHEST WALL: No tenderness  CHEST: Normal respiration, clear to auscultation bilaterally  HEART: Regular rate and rhythm; no murmurs rubs or gallops  BACK: No kyphosis or scoliosis; no CVA tenderness  ABDOMEN: Positive Bowel Sounds, soft non-tender; no masses, no organomegaly.  Rectal Exam: Not done  EXTREMITIES: No cyanosis, clubbing or edema; no ulcerations.  Genitalia: not examined  PULSES: 2+ and symmetric  SKIN: Normal hydration no rash or ulceration  CNS: Alert X Oriented x 4, Lethargic, but No Focal Deficits  Vascular: pulses palpable throughout    Labs on Admission:  Basic Metabolic Panel:   Recent Labs  Lab  07/24/13 1056  07/29/13 2119  07/29/13 2255   NA  141  137  --   K  3.8  5.5*  5.3   CL  100  101  --   CO2  21  25  --   GLUCOSE  97  116*  --   BUN  10  18  --   CREATININE  1.10  1.13  --   CALCIUM  9.2  9.6  --    Liver Function Tests:    Recent Labs  Lab  07/24/13 1056  07/29/13 2119   AST  30  47*   ALT  38  42  ALKPHOS  53  51   BILITOT  1.0  <0.2*   PROT  7.5  7.1   ALBUMIN  4.1  3.4*    No results found for this basename: LIPASE, AMYLASE, in the last 168 hours  No results found for this basename: AMMONIA, in the last 168 hours  CBC:   Recent Labs  Lab  07/24/13 1056  07/29/13 2119   WBC  8.6  5.8   HGB  14.2  13.0   HCT  42.1  38.9*   MCV  86.3  85.1   PLT  352  295    Cardiac Enzymes:   Recent Labs  Lab  07/29/13 2119   TROPONINI  <0.30    BNP (last 3 results)  No results found for this basename: PROBNP, in the last 8760 hours  CBG:  No results found for this basename: GLUCAP, in the last 168 hours  Radiological Exams on Admission:  Dg Abd 1 View  07/30/2013 CLINICAL DATA: Abdominal pain. Hematemesis. EXAM: ABDOMEN - 1 VIEW COMPARISON: None. FINDINGS: No evidence of dilated bowel loops. Large stool burden seen throughout the colon. No evidence of free air. No radiopaque calculi identified. Pelvic vascular calcification noted. IMPRESSION: No acute findings. Large stool burden noted; suggest clinical correlation for possible constipation. Electronically Signed By: Myles RosenthalJohn Stahl M.D. On: 07/30/2013 01:13     Assessment/Plan: 48 y.o. male with  Principal Problem:  GI bleed  Active Problems:  Hematemesis  Chronic low back pain  Alcohol dependence with alcohol-induced mood disorder  Major depression, recurrent  Cocaine dependence  Schizoaffective disorder    1.. GI Bleed/Hematemesis/ ABD Pain- Monitor H/Hs, Transfuse if needed. NPO except for ICE Chips, IVFs, IV Protonix, Please consult GI in AM. Pain control PRN.  2. Polysubstance Abuse- ETOh Hx, Cocaine Hx, MArijuana, Xanax Abuse- [facility], out of window for ETOH Withdrawal. Monitor.  3. Chronic Low Back Pain- Pain Control PRN.  4. Depression and Schizoaffective Disorder- [facility]  5. DVT prophylaxis  with SCDs.     Code Status: FULL CODE  Family Communication: No Family Present  Disposition Plan: Inpatient    Time spent: 6460 Minutes  Ron ParkerJENKINS,Dashia Caldeira C  [facility]  Pager [phone]  If 7PM-7AM, please contact night-coverage  [email or web address]  Password [email or web address]  07/30/2013, 1:29 AM

## 2013-07-31 ENCOUNTER — Encounter (HOSPITAL_COMMUNITY): Payer: Self-pay | Admitting: Gastroenterology

## 2013-07-31 LAB — BASIC METABOLIC PANEL
ANION GAP: 11 (ref 5–15)
BUN: 12 mg/dL (ref 6–23)
CHLORIDE: 98 meq/L (ref 96–112)
CO2: 29 mEq/L (ref 19–32)
Calcium: 9.5 mg/dL (ref 8.4–10.5)
Creatinine, Ser: 1.13 mg/dL (ref 0.50–1.35)
GFR calc non Af Amer: 76 mL/min — ABNORMAL LOW (ref >90)
GFR, EST AFRICAN AMERICAN: 88 mL/min — AB (ref >90)
Glucose, Bld: 106 mg/dL — ABNORMAL HIGH (ref 70–99)
Potassium: 4.6 mEq/L (ref 3.7–5.3)
SODIUM: 138 meq/L (ref 137–147)

## 2013-07-31 LAB — HEMOGLOBIN AND HEMATOCRIT, BLOOD
HEMATOCRIT: 41.5 % (ref 39.0–52.0)
Hemoglobin: 13.9 g/dL (ref 13.0–17.0)

## 2013-07-31 LAB — CLOTEST (H. PYLORI), BIOPSY: Helicobacter screen: NEGATIVE

## 2013-07-31 NOTE — Discharge Summary (Signed)
Physician Discharge Summary  Lonnie [name] ZOX:[id number] DOB: 05/20/1965 DOA: 07/30/2013  PCP: No PCP Per Patient  Admit date: 07/30/2013 Discharge date: 07/31/2013  Time spent:   Patient left AMA overnight, I did not see patient prior to leaving   Recommendations for Outpatient Follow-up:  1. Follow up on CBC in 1 week    Discharge Diagnoses:  Active Problems:   GI bleed   Discharge Condition:   Diet recommendation:   Filed Weights   07/30/13 0219 07/30/13 0903 07/30/13 1345  Weight: 86.2 kg (190 lb 0.6 oz) 93.441 kg (206 lb) 91.672 kg (202 lb 1.6 oz)    History of present illness:  Lonnie JunesHoward J Larimer is a 48 y.o. male with a history of Polysubstance Abuse, Scizophernia, Major Depression and Lumbago who [facility] and was having no difficulties until this afternoon when he began to have 4 episodes of hematemesis. He reports vomiting bright red blood at first then became brownish. He also has epigastric ABD pain. He denies having any melena or fever or chills. He denies having any similar previous episodes. He has not had any Alcohol or Illicit nonprescribed substances since [facility].   Hospital Course:  Patient is pleasant [age] year with a past medical history of polysubstance Abuse, Scizophernia, Major Depression and Lumbago, admitted overnight with complaints of hematemesis x4 that occurred at [facility]. He was seen and evaluated by GI undergoing EGD on 07/30/2013 which showed a very small prepyloric ulcer and mild gastritis. On the evening of 07/30/2013 he left against medical advise. I did not evaluate patient prior to his leaving.     Procedures:  EGD #1 very small prepyloric ulcer  #2 mild gastritis  #3 minimal hiatal hernia  #4 no high-risk bleeding lesions seen nor any old or fresh blood in  the upper GI tract   Consultations:  GI  Discharge Exam: Filed Vitals:   07/31/13 0458  BP: 116/78  Pulse: 90  Temp: 98.3 F (36.8  C)  Resp: 24     Discharge Instructions You were cared for by a hospitalist during your hospital stay. If you have any questions about your discharge medications or the care you received while you were in the hospital after you are discharged, you can call the unit and asked to speak with the hospitalist on call if the hospitalist that took care of you is not available. Once you are discharged, your primary care physician will handle any further medical issues. Please note that NO REFILLS for any discharge medications will be authorized once you are discharged, as it is imperative that you return to your primary care physician (or establish a relationship with a primary care physician if you do not have one) for your aftercare needs so that they can reassess your need for medications and monitor your lab values.     Medication List    ASK your doctor about these medications       cyclobenzaprine 10 MG tablet  Commonly known as:  FLEXERIL  Take 1 tablet (10 mg total) by mouth 3 (three) times daily.     doxycycline 100 MG tablet  Commonly known as:  VIBRA-TABS  Take 1 tablet (100 mg total) by mouth every 12 (twelve) hours.     FLUoxetine 20 MG capsule  Commonly known as:  PROZAC  Take 1 capsule (20 mg total) by mouth daily.     HYDROcodone-acetaminophen 5-325 MG per tablet  Commonly  known as:  NORCO/VICODIN  Take 1 tablet by mouth every 6 (six) hours as needed for moderate pain.     lidocaine 5 %  Commonly known as:  LIDODERM  Place 1 patch onto the skin daily. Remove & Discard patch within 12 hours or as directed by MD     mupirocin cream 2 %  Commonly known as:  BACTROBAN  Apply topically 2 (two) times daily.     prazosin 1 MG capsule  Commonly known as:  MINIPRESS  Take 1 capsule (1 mg total) by mouth at bedtime.     risperidone 4 MG tablet  Commonly known as:  RISPERDAL  Take 4 mg by mouth at bedtime.       Allergies  Allergen Reactions  . Ibuprofen Itching       The results of significant diagnostics from this hospitalization (including imaging, microbiology, ancillary and laboratory) are listed below for reference.    Significant Diagnostic Studies: Dg Abd 1 View  07/30/2013   CLINICAL DATA:  Abdominal pain.  Hematemesis.  EXAM: ABDOMEN - 1 VIEW  COMPARISON:  None.  FINDINGS: No evidence of dilated bowel loops. Large stool burden seen throughout the colon. No evidence of free air. No radiopaque calculi identified. Pelvic vascular calcification noted.  IMPRESSION: No acute findings.  Large stool burden noted; suggest clinical correlation for possible constipation.   Electronically Signed   By: Myles Rosenthal M.D.   On: 07/30/2013 01:13    Microbiology: Recent Results (from the past 240 hour(s))  MRSA PCR SCREENING     Status: None   Collection Time    07/30/13  1:55 AM      Result Value Ref Range Status   MRSA by PCR NEGATIVE  NEGATIVE Final   Comment:            The GeneXpert MRSA Assay (FDA     approved for NASAL specimens     only), is one component of a     comprehensive MRSA colonization     surveillance program. It is not     intended to diagnose MRSA     infection nor to guide or     monitor treatment for     MRSA infections.     Labs: Basic Metabolic Panel:  Recent Labs Lab 07/29/13 2119 07/29/13 2255 07/30/13 0348 07/31/13 0515  NA 137  --  139 138  K 5.5* 5.3 4.5 4.6  CL 101  --  101 98  CO2 25  --  26 29  GLUCOSE 116*  --  86 106*  BUN 18  --  17 12  CREATININE 1.13  --  1.11 1.13  CALCIUM 9.6  --  8.7 9.5   Liver Function Tests:  Recent Labs Lab 07/29/13 2119  AST 47*  ALT 42  ALKPHOS 51  BILITOT <0.2*  PROT 7.1  ALBUMIN 3.4*   No results found for this basename: LIPASE, AMYLASE,  in the last 168 hours No results found for this basename: AMMONIA,  in the last 168 hours CBC:  Recent Labs Lab 07/29/13 2119 07/30/13 0348 07/30/13 1055 07/30/13 1909 07/31/13 0515  WBC 5.8 5.4  --   --   --    HGB 13.0 12.3* 13.4 13.1 13.9  HCT 38.9* 37.2* 40.7 39.6 41.5  MCV 85.1 84.7  --   --   --   PLT 295 307  --   --   --    Cardiac Enzymes:  Recent Labs  Lab 07/29/13 2119  TROPONINI <0.30   BNP: BNP (last 3 results) No results found for this basename: PROBNP,  in the last 8760 hours CBG:  Recent Labs Lab 07/30/13 0056  GLUCAP 99       Signed:  Arianni Gallego  [facility] 07/31/2013, 6:19 PM

## 2013-07-31 NOTE — Progress Notes (Signed)
Patient has been asking to leave early today, explained to him that there is no discharge order yet from MD. Merdis DelayK. Schorr NP was made aware. Patient left hospital against medical advice. Patient is alert, oriented x4 when he signed the paper.

## 2013-08-09 ENCOUNTER — Inpatient Hospital Stay (HOSPITAL_BASED_OUTPATIENT_CLINIC_OR_DEPARTMENT_OTHER)
Admission: EM | Admit: 2013-08-09 | Discharge: 2013-08-10 | DRG: 068 | Disposition: A | Discharge status: Home / Self Care | Payer: Self-pay | Attending: Oncology | Admitting: Oncology

## 2013-08-09 ENCOUNTER — Inpatient Hospital Stay (HOSPITAL_COMMUNITY): Payer: Self-pay

## 2013-08-09 ENCOUNTER — Emergency Department (HOSPITAL_BASED_OUTPATIENT_CLINIC_OR_DEPARTMENT_OTHER): Payer: Self-pay

## 2013-08-09 ENCOUNTER — Encounter (HOSPITAL_BASED_OUTPATIENT_CLINIC_OR_DEPARTMENT_OTHER): Payer: Self-pay | Admitting: Emergency Medicine

## 2013-08-09 DIAGNOSIS — F259 Schizoaffective disorder, unspecified: Secondary | ICD-10-CM | POA: Diagnosis present

## 2013-08-09 DIAGNOSIS — Z833 Family history of diabetes mellitus: Secondary | ICD-10-CM

## 2013-08-09 DIAGNOSIS — F3289 Other specified depressive episodes: Secondary | ICD-10-CM | POA: Diagnosis present

## 2013-08-09 DIAGNOSIS — R1310 Dysphagia, unspecified: Secondary | ICD-10-CM

## 2013-08-09 DIAGNOSIS — R358 Other polyuria: Secondary | ICD-10-CM | POA: Insufficient documentation

## 2013-08-09 DIAGNOSIS — Z23 Encounter for immunization: Secondary | ICD-10-CM

## 2013-08-09 DIAGNOSIS — F17200 Nicotine dependence, unspecified, uncomplicated: Secondary | ICD-10-CM | POA: Diagnosis present

## 2013-08-09 DIAGNOSIS — G459 Transient cerebral ischemic attack, unspecified: Secondary | ICD-10-CM | POA: Diagnosis present

## 2013-08-09 DIAGNOSIS — K219 Gastro-esophageal reflux disease without esophagitis: Secondary | ICD-10-CM | POA: Diagnosis present

## 2013-08-09 DIAGNOSIS — R3589 Other polyuria: Secondary | ICD-10-CM | POA: Insufficient documentation

## 2013-08-09 DIAGNOSIS — I771 Stricture of artery: Secondary | ICD-10-CM | POA: Diagnosis present

## 2013-08-09 DIAGNOSIS — Z886 Allergy status to analgesic agent status: Secondary | ICD-10-CM

## 2013-08-09 DIAGNOSIS — R4781 Slurred speech: Secondary | ICD-10-CM

## 2013-08-09 DIAGNOSIS — I651 Occlusion and stenosis of basilar artery: Principal | ICD-10-CM | POA: Diagnosis present

## 2013-08-09 DIAGNOSIS — R531 Weakness: Secondary | ICD-10-CM

## 2013-08-09 DIAGNOSIS — Z8249 Family history of ischemic heart disease and other diseases of the circulatory system: Secondary | ICD-10-CM

## 2013-08-09 DIAGNOSIS — F1211 Cannabis abuse, in remission: Secondary | ICD-10-CM | POA: Diagnosis present

## 2013-08-09 DIAGNOSIS — F1421 Cocaine dependence, in remission: Secondary | ICD-10-CM | POA: Diagnosis present

## 2013-08-09 DIAGNOSIS — F329 Major depressive disorder, single episode, unspecified: Secondary | ICD-10-CM

## 2013-08-09 DIAGNOSIS — R4789 Other speech disturbances: Secondary | ICD-10-CM

## 2013-08-09 LAB — URINALYSIS, ROUTINE W REFLEX MICROSCOPIC
BILIRUBIN URINE: NEGATIVE
Glucose, UA: NEGATIVE mg/dL
Hgb urine dipstick: NEGATIVE
Ketones, ur: NEGATIVE mg/dL
Leukocytes, UA: NEGATIVE
Nitrite: NEGATIVE
PH: 7 (ref 5.0–8.0)
Protein, ur: NEGATIVE mg/dL
Specific Gravity, Urine: 1.014 (ref 1.005–1.030)
Urobilinogen, UA: 0.2 mg/dL (ref 0.0–1.0)

## 2013-08-09 LAB — COMPREHENSIVE METABOLIC PANEL
ALT: 49 U/L (ref 0–53)
AST: 38 U/L — AB (ref 0–37)
Albumin: 3.8 g/dL (ref 3.5–5.2)
Alkaline Phosphatase: 51 U/L (ref 39–117)
Anion gap: 13 (ref 5–15)
BUN: 13 mg/dL (ref 6–23)
CALCIUM: 9.9 mg/dL (ref 8.4–10.5)
CO2: 27 mEq/L (ref 19–32)
Chloride: 96 mEq/L (ref 96–112)
Creatinine, Ser: 1.2 mg/dL (ref 0.50–1.35)
GFR calc non Af Amer: 70 mL/min — ABNORMAL LOW (ref >90)
GFR, EST AFRICAN AMERICAN: 82 mL/min — AB (ref >90)
Glucose, Bld: 145 mg/dL — ABNORMAL HIGH (ref 70–99)
Potassium: 4.7 mEq/L (ref 3.7–5.3)
SODIUM: 136 meq/L — AB (ref 137–147)
TOTAL PROTEIN: 7.5 g/dL (ref 6.0–8.3)
Total Bilirubin: 0.6 mg/dL (ref 0.3–1.2)

## 2013-08-09 LAB — CREATININE, SERUM
CREATININE: 1.04 mg/dL (ref 0.50–1.35)
GFR, EST NON AFRICAN AMERICAN: 84 mL/min — AB (ref >90)

## 2013-08-09 LAB — RAPID URINE DRUG SCREEN, HOSP PERFORMED
Amphetamines: NOT DETECTED
Barbiturates: NOT DETECTED
Benzodiazepines: NOT DETECTED
COCAINE: NOT DETECTED
OPIATES: NOT DETECTED
TETRAHYDROCANNABINOL: NOT DETECTED

## 2013-08-09 LAB — CBC WITH DIFFERENTIAL/PLATELET
BASOS PCT: 1 % (ref 0–1)
Basophils Absolute: 0.1 10*3/uL (ref 0.0–0.1)
EOS PCT: 3 % (ref 0–5)
Eosinophils Absolute: 0.2 10*3/uL (ref 0.0–0.7)
HCT: 41.4 % (ref 39.0–52.0)
HEMOGLOBIN: 14.1 g/dL (ref 13.0–17.0)
LYMPHS ABS: 1.7 10*3/uL (ref 0.7–4.0)
Lymphocytes Relative: 33 % (ref 12–46)
MCH: 28.3 pg (ref 26.0–34.0)
MCHC: 34.1 g/dL (ref 30.0–36.0)
MCV: 83.1 fL (ref 78.0–100.0)
MONO ABS: 0.7 10*3/uL (ref 0.1–1.0)
MONOS PCT: 15 % — AB (ref 3–12)
NEUTROS PCT: 48 % (ref 43–77)
Neutro Abs: 2.4 10*3/uL (ref 1.7–7.7)
Platelets: 283 10*3/uL (ref 150–400)
RBC: 4.98 MIL/uL (ref 4.22–5.81)
RDW: 15.2 % (ref 11.5–15.5)
WBC: 5 10*3/uL (ref 4.0–10.5)

## 2013-08-09 LAB — CBC
HCT: 39.8 % (ref 39.0–52.0)
HEMOGLOBIN: 13.5 g/dL (ref 13.0–17.0)
MCH: 28.4 pg (ref 26.0–34.0)
MCHC: 33.9 g/dL (ref 30.0–36.0)
MCV: 83.6 fL (ref 78.0–100.0)
Platelets: 270 10*3/uL (ref 150–400)
RBC: 4.76 MIL/uL (ref 4.22–5.81)
RDW: 15 % (ref 11.5–15.5)
WBC: 5.5 10*3/uL (ref 4.0–10.5)

## 2013-08-09 LAB — HEMOGLOBIN A1C
Hgb A1c MFr Bld: 5.5 % (ref <5.7)
Mean Plasma Glucose: 111 mg/dL (ref <117)

## 2013-08-09 LAB — CBG MONITORING, ED: Glucose-Capillary: 151 mg/dL — ABNORMAL HIGH (ref 70–99)

## 2013-08-09 LAB — TROPONIN I: Troponin I: <0.3 ng/mL (ref <0.30)

## 2013-08-09 LAB — PROTIME-INR
INR: 1 (ref 0.00–1.49)
PROTHROMBIN TIME: 13.2 s (ref 11.6–15.2)

## 2013-08-09 LAB — APTT: APTT: 26 s (ref 24–37)

## 2013-08-09 LAB — ETHANOL: Alcohol, Ethyl (B): <11 mg/dL (ref 0–11)

## 2013-08-09 MED ORDER — ASPIRIN EC 81 MG PO TBEC
81.0000 mg | DELAYED_RELEASE_TABLET | Freq: Every day | ORAL | Status: DC
  Administered 2013-08-09 – 2013-08-10 (×2): 81 mg via ORAL
  Filled 2013-08-09 (×2): qty 1

## 2013-08-09 MED ORDER — STROKE: EARLY STAGES OF RECOVERY BOOK
Freq: Once | Status: DC
  Filled 2013-08-09 (×2): qty 1

## 2013-08-09 MED ORDER — HEPARIN SODIUM (PORCINE) 5000 UNIT/ML IJ SOLN
5000.0000 [IU] | Freq: Three times a day (TID) | INTRAMUSCULAR | Status: DC
  Administered 2013-08-09 – 2013-08-10 (×3): 5000 [IU] via SUBCUTANEOUS
  Filled 2013-08-09 (×6): qty 1

## 2013-08-09 MED ORDER — NIMODIPINE 30 MG PO CAPS
30.0000 mg | ORAL_CAPSULE | ORAL | Status: DC
  Administered 2013-08-09 – 2013-08-10 (×5): 30 mg via ORAL
  Filled 2013-08-09 (×11): qty 1

## 2013-08-09 MED ORDER — PANTOPRAZOLE SODIUM 40 MG PO TBEC
40.0000 mg | DELAYED_RELEASE_TABLET | Freq: Two times a day (BID) | ORAL | Status: DC
  Administered 2013-08-09 – 2013-08-10 (×3): 40 mg via ORAL
  Filled 2013-08-09 (×3): qty 1

## 2013-08-09 MED ORDER — SODIUM CHLORIDE 0.9 % IV SOLN
INTRAVENOUS | Status: AC
  Administered 2013-08-09: 12:00:00 via INTRAVENOUS

## 2013-08-09 MED ORDER — PNEUMOCOCCAL VAC POLYVALENT 25 MCG/0.5ML IJ INJ
0.5000 mL | INJECTION | INTRAMUSCULAR | Status: AC
  Administered 2013-08-10: 0.5 mL via INTRAMUSCULAR
  Filled 2013-08-09: qty 0.5

## 2013-08-09 MED ORDER — SENNOSIDES-DOCUSATE SODIUM 8.6-50 MG PO TABS
1.0000 | ORAL_TABLET | Freq: Every evening | ORAL | Status: DC | PRN
  Filled 2013-08-09: qty 1

## 2013-08-09 NOTE — ED Notes (Addendum)
Report given to [facility]. 

## 2013-08-09 NOTE — ED Notes (Signed)
Code Stroke--called via [facility]--spoke with [employer] M[employer]Doug

## 2013-08-09 NOTE — ED Notes (Signed)
Patient transported to MRI 

## 2013-08-09 NOTE — ED Provider Notes (Signed)
Neurology recommends medical admission to rule out CVA. Not TPA candidate secondary to low NIH scale. He is awake, alert, in no distress  BP 123/77  Pulse 92  Temp(Src) 98.2 F (36.8 C) (Oral)  Resp 17  SpO2 98%   Lonnie [name], MD 08/09/13 1135

## 2013-08-09 NOTE — ED Provider Notes (Signed)
CSN: [id number]     Arrival date & time 08/09/13  0836 History   First MD Initiated Contact with Patient 08/09/13 [phone]     No chief complaint on file.    (Consider location/radiation/quality/duration/timing/severity/associated sxs/prior Treatment) HPI Lonnie [name] is a 48 y.o. male that presents to the ED for generalized weakness and left facial numbness. He has a history of cocaine and alcohol abuse and was sent from [facility]. Patient started having symptoms this morning around 7:45AM with sudden onset of weakness, gait instability and left facial numbness. Patient has no previous history of stroke. Symptoms have remained unchanged and constant since earlier. Last cocaine or alcohol use was 23 days ago.   Past Medical History  Diagnosis Date  . GERD (gastroesophageal reflux disease)   . Depression   . Lumbago   . ETOH abuse   . Cocaine abuse   . Xanax use disorder, mild, abuse   . Marijuana abuse   . Schizophrenia    Past Surgical History  Procedure Laterality Date  . Back surgery    . Neck surgery    . Esophagogastroduodenoscopy (egd) with propofol N/A 07/30/2013    Procedure: ESOPHAGOGASTRODUODENOSCOPY (EGD) WITH PROPOFOL;  Surgeon: Barrie Folk, MD;  Location: [facility] END[facility];  Service: Endoscopy;  Laterality: N/A;   Family History  Problem Relation Age of Onset  . Diabetes Mother   . Hypertension Mother   . Hyperlipidemia Father   . Heart attack Neg Hx   . Sudden death Neg Hx    History  Substance Use Topics  . Smoking status: Current Every Day Smoker -- 0.50 packs/day for 15 years    Types: Cigarettes    Last Attempt to Quit: 08/08/2012  . Smokeless tobacco: Never Used  . Alcohol Use: Yes     Comment: 12 pk daily    Review of Systems  Constitutional: Negative for fatigue.  Musculoskeletal: Positive for gait problem.  Neurological: Positive for speech difficulty, weakness and headaches. Negative for seizures, syncope, facial asymmetry and  light-headedness.  All other systems reviewed and are negative.     Allergies  Ibuprofen  Home Medications   Prior to Admission medications   Medication Sig Start Date End Date Taking? Authorizing Provider  cyclobenzaprine (FLEXERIL) 10 MG tablet Take 1 tablet (10 mg total) by mouth 3 (three) times daily. 07/29/13   Beau Fanny, FNP  doxycycline (VIBRA-TABS) 100 MG tablet Take 1 tablet (100 mg total) by mouth every 12 (twelve) hours. 07/29/13   Beau Fanny, FNP  FLUoxetine (PROZAC) 20 MG capsule Take 1 capsule (20 mg total) by mouth daily. 07/29/13   Beau Fanny, FNP  HYDROcodone-acetaminophen (NORCO/VICODIN) 5-325 MG per tablet Take 1 tablet by mouth every 6 (six) hours as needed for moderate pain. 07/29/13   Beau Fanny, FNP  lidocaine (LIDODERM) 5 % Place 1 patch onto the skin daily. Remove & Discard patch within 12 hours or as directed by MD 07/29/13   Beau Fanny, FNP  mupirocin cream (BACTROBAN) 2 % Apply topically 2 (two) times daily. 07/29/13   Beau Fanny, FNP  prazosin (MINIPRESS) 1 MG capsule Take 1 capsule (1 mg total) by mouth at bedtime. 07/29/13 07/29/14  Beau Fanny, FNP  risperidone (RISPERDAL) 4 MG tablet Take 4 mg by mouth at bedtime.     Historical Provider, MD   There were no vitals taken for this visit.  Physical Exam  Constitutional: He is oriented to person, place, and  time. He appears well-developed and well-nourished.  HENT:  Mouth/Throat: Mucous membranes are dry.  Eyes: Conjunctivae are normal. Pupils are equal, round, and reactive to light. Left conjunctiva is not injected. Scleral icterus is present. Left eye exhibits abnormal extraocular motion (upward gaze limited). Left eye exhibits no nystagmus.  Neck: Normal range of motion and full passive range of motion without pain. Neck supple.  Cardiovascular: Normal rate, regular rhythm, normal heart sounds and normal pulses.   Pulmonary/Chest: Effort normal and breath sounds normal. No respiratory  distress. He has no decreased breath sounds. He has no wheezes.  Abdominal: Soft. Normal appearance. There is no tenderness.  Neurological: He is alert and oriented to person, place, and time. No cranial nerve deficit (no facial droop, sensation intact bilaterally) or sensory deficit. He displays a negative Romberg sign. GCS eye subscore is 4. GCS verbal subscore is 5. GCS motor subscore is 6.  Negative pronator drift Could not illicit reflexes 5/5 upper extremity strength bilaterally 4/5 lower extremity strength bilaterally No meningismus Gait mostly normal but patient is walking cautiously    ED Course  Procedures (including critical care time) Labs Review Labs Reviewed  CBC WITH DIFFERENTIAL - Abnormal; Notable for the following:    Monocytes Relative 15 (*)    All other components within normal limits  CBG MONITORING, ED - Abnormal; Notable for the following:    Glucose-Capillary 151 (*)    All other components within normal limits  PROTIME-INR  APTT  ETHANOL  COMPREHENSIVE METABOLIC PANEL  URINE RAPID DRUG SCREEN (HOSP PERFORMED)  URINALYSIS, ROUTINE W REFLEX MICROSCOPIC  TROPONIN I    Imaging Review Ct Head Wo Contrast  08/09/2013   CLINICAL DATA:  Slurred speech, weakness.  EXAM: CT HEAD WITHOUT CONTRAST  TECHNIQUE: Contiguous axial images were obtained from the base of the skull through the vertex without intravenous contrast.  COMPARISON:  CT scan of September 06, 2011.  FINDINGS: Bony calvarium appears intact. No mass effect or midline shift is noted. Ventricular size is within normal limits. There is no evidence of mass lesion, hemorrhage or acute infarction.  IMPRESSION: Normal head CT.   Electronically Signed   By: Roque LiasJames  Green M.D.   On: 08/09/2013 09:25     EKG Interpretation None     9:00AM: Dr. Leroy Kennedyamilo, Neurology, informed of patient. [facility] after  9:35AM: Patient leaving facility for [facility]   MDM   Final  diagnoses:  Slurred speech  General weakness  Cocaine dependence in remission   Patient presentation concerning for possible stroke. CT head negative for acute bleed. [facility]  for evaluation and admission per Dr. Leroy Kennedyamilo, Neurology. Patient evaluated before transfer and stable.    Jacquelin Hawkingalph Armentha Branagan, MD 08/09/13 [phone]

## 2013-08-09 NOTE — ED Notes (Signed)
Pt speech is clear- slow speaking. Eyes are red. Pt following commands. Pt appears tired.

## 2013-08-09 NOTE — Code Documentation (Signed)
48 year old male transferred from [facility] as Code Stroke.  On arrival he is alert warm and dry speech clear - he states he feels it is a little delayed but no aphasia and no dysarthria noted.  He states he woke up this AM at 0500 and all was well.  He had a headache which he states he has had for three days.  [facility] for [facility] and ETOH rehab.  He states he has been clean for 23 days.  His U[facility] at [facility].  He took Tylenol and went back to sleep.  Woke at 0600 got up dressed himself and moved some furniture.  At 0745 he noticed that his gait was off and his speech was "slow" - he stated took him awhile to respond.  On presentation to [facility] his NIHHS was 1 with some sensory loss to right side.  He can feel the touch is just dull versus sharp.  Stoke swallow screen normal.  Handoff to [employer] RN.

## 2013-08-09 NOTE — ED Notes (Signed)
Will transport pt to [facility] once back from MRI

## 2013-08-09 NOTE — H&P (Signed)
Date: 08/09/2013               Patient Name:  Lonnie [name] MRN: [id number]  DOB: 29-Jun-1965 Age / Sex: 48 y.o., male   PCP: No Pcp Per Patient         Medical Service: Internal Medicine Teaching Service         Attending Physician: Dr. Levert Feinstein, MD    First Contact: Camila Li  Pager: [phone]  Second Contact: Dr. Burtis Junes Pager: [phone]       After Hours (After 5p/  First Contact Pager: [phone]  weekends / holidays): Second Contact Pager: [phone]  Chief Complaint: slurred speech   History of Present Illness: Lonnie [name] is a 48 yo with PMH of polysubstance abuse on detox (clean for 23 days), depression, schizophrenia, GERD who was in his normal state of health until this morning at around 7:45am when he felt nauseated after standing up during a meeting at the [facility] house he is staying at for substance abuse treatment. He described feeling weak, right sided fascial numbness, inability to keep his eyes open, and was drooling. He had non-specific blurry vision in both eyes and was slurring his words for a couple hours after the event. He also describes feeling week in his right upper extremities. He denies loss of consciousness or urine/stool incontinence. Patent states that his symptoms lasted until shortly after he arrived at the hospital. He denies any of these symptoms currently.  Of note, pt complains of a bilateral throbbing headache of 3 days duration in the temporal region that improves when he hold pressure to the area. He states that it is constant and does not improve with rest.  He denies chest pain, shortness of breath. He has had some nausea with dry heaving but no emesis. He also reports that he has been hearing non-threatening voices recently, but none today.  Upon arrival to ED he was noted to have "subtle dysarthria and some restriction of upward gaze" and thus a code stroke was activated. He had a CT that showed no evidence of acute hemorrhage. He was not  a TPA candidate given low NIH stroke scale. MRI showed no visible stroke or intracranial process, but did show focal diffuse narrowing of the basilar arteryraising concern for drug-induced arteritis or reversible cerebral vasoconstrictive syndrome. Urine drug screen was negative.   Meds: Medications Prior to Admission  Medication Sig Dispense Refill  . cyclobenzaprine (FLEXERIL) 10 MG tablet Take 1 tablet (10 mg total) by mouth 3 (three) times daily.  90 tablet  0  . FLUoxetine (PROZAC) 20 MG capsule Take 1 capsule (20 mg total) by mouth daily.  30 capsule  0  . prazosin (MINIPRESS) 1 MG capsule Take 1 capsule (1 mg total) by mouth at bedtime.  30 capsule  0  . risperidone (RISPERDAL) 4 MG tablet Take 4 mg by mouth at bedtime.        Allergies: Allergies as of 08/09/2013 - Review Complete 08/09/2013  Allergen Reaction Noted  . Ibuprofen Itching 11/10/2011   Past Medical History  Diagnosis Date  . GERD (gastroesophageal reflux disease)   . Depression   . Lumbago   . ETOH abuse   . Cocaine abuse   . Xanax use disorder, mild, abuse   . Marijuana abuse   . Schizophrenia    Past Surgical History  Procedure Laterality Date  . Back surgery    . Neck surgery    . Esophagogastroduodenoscopy (egd) with  propofol N/A 07/30/2013    Procedure: ESOPHAGOGASTRODUODENOSCOPY (EGD) WITH PROPOFOL;  Surgeon: Barrie FolkJohn C Hayes, MD;  Location: [facility];  Service: Endoscopy;  Laterality: N/A;   Family History  Problem Relation Age of Onset  . Diabetes Mother   . Hypertension Mother   . Hyperlipidemia Father   . Heart attack Neg Hx   . Sudden death Neg Hx    History   Social History  . Marital Status: Single    Spouse Name: N/A    Number of Children: N/A  . Years of Education: N/A   Occupational History  . Not on file.   Social History Main Topics  . Smoking status: Current Every Day Smoker -- 0.50 packs/day for 15 years    Types: Cigarettes    Last Attempt to Quit: 08/08/2012  . Smokeless  tobacco: Never Used  . Alcohol Use: Yes     Comment: 12 pk daily  . Drug Use: Yes    Special: Cocaine, Marijuana  . Sexual Activity: Yes    Birth Control/ Protection: None   Other Topics Concern  . Not on file   Social History Narrative   Pt reports that he lives with his partner and their kids [place] until recently moving to [facility] house a week ago for residential substance abuse treatment. He reports a history of smoking cigarettes and significant alcohol consumption but has stopped since [facility]. He has reports being clean from cocaine for the last 23 days. He has been unemployed for the past 3 years.     Review of Systems: A comprehensive review of systems was negative. Pertinent listed in HPI.   Physical Exam: Blood pressure 125/85, pulse 88, temperature 98 F (36.7 C), temperature source Oral, resp. rate 18, SpO2 96.00%. General: lying in bed, well appearing, in no acute distress HEENT: PERRLA, moist mucus membranes Heart: Regular rate and rhythm, no murmurs, no carotid bruise Lungs: CTAB, normal work of breathing Abdomen: Normal active bowel sounds, non-tender, non-distended Extremities: no peripheral edema, pulses 2+ bilaterally Skin: Pt has an acne like bump no his back that is firm and mildly tender on palpation Neurologic A&O x 3,vission grossly intact, no visual field deficits, CN III-XII intact. 5/5 strength upper and lower extremities bilaterally, reflexes 2+ upper lower extremities bilaterally, rapid alternating movements and finger to nose tests normal.  Lab results: Basic Metabolic Panel:  Recent Labs  16/10/9605/16/15 0900 08/09/13 1530  NA 136*  --   K 4.7  --   CL 96  --   CO2 27  --   GLUCOSE 145*  --   BUN 13  --   CREATININE 1.20 1.04  CALCIUM 9.9  --    Liver Function Tests:  Recent Labs  08/09/13 0900  AST 38*  ALT 49  ALKPHOS 51  BILITOT 0.6  PROT 7.5  ALBUMIN 3.8   CBC:  Recent Labs  08/09/13 0900  08/09/13 1530  WBC 5.0 5.5  NEUTROABS 2.4  --   HGB 14.1 13.5  HCT 41.4 39.8  MCV 83.1 83.6  PLT 283 270   Cardiac Enzymes:  Recent Labs  08/09/13 0900  TROPONINI <0.30   CBG:  Recent Labs  08/09/13 0846  GLUCAP 151*   Coagulation:  Recent Labs  08/09/13 0900  LABPROT 13.2  INR 1.00   Urine Drug Screen: Drugs of Abuse     Component Value Date/Time   LABOPIA NONE DETECTED 08/09/2013 0916   COCAINSCRNUR NONE DETECTED 08/09/2013 0916  LABBENZ NONE DETECTED 08/09/2013 0916   AMPHETMU NONE DETECTED 08/09/2013 0916   THCU NONE DETECTED 08/09/2013 0916   LABBARB NONE DETECTED 08/09/2013 0916    Alcohol Level:  Recent Labs  08/09/13 0900  ETH <11   Urinalysis:  Recent Labs  08/09/13 0916  COLORURINE YELLOW  LABSPEC 1.014  PHURINE 7.0  GLUCOSEU NEGATIVE  HGBUR NEGATIVE  BILIRUBINUR NEGATIVE  KETONESUR NEGATIVE  PROTEINUR NEGATIVE  UROBILINOGEN 0.2  NITRITE NEGATIVE  LEUKOCYTESUR NEGATIVE   Imaging results:  Dg Chest 2 View  08/09/2013   CLINICAL DATA:  Stroke, chest tightness, RIGHT side weakness, near syncope for 1 day, history GERD, smoking  EXAM: CHEST  2 VIEW  COMPARISON:  03/06/2013  FINDINGS: Normal heart size, mediastinal contours, and pulmonary vascularity.  Lungs clear.  No pneumothorax.  Bones unremarkable.  IMPRESSION: Normal exam.   Electronically Signed   By: Ulyses Southward M.D.   On: 08/09/2013 16:01   Ct Head Wo Contrast  08/09/2013   CLINICAL DATA:  Slurred speech, weakness.  EXAM: CT HEAD WITHOUT CONTRAST  TECHNIQUE: Contiguous axial images were obtained from the base of the skull through the vertex without intravenous contrast.  COMPARISON:  CT scan of September 06, 2011.  FINDINGS: Bony calvarium appears intact. No mass effect or midline shift is noted. Ventricular size is within normal limits. There is no evidence of mass lesion, hemorrhage or acute infarction.  IMPRESSION: Normal head CT.   Electronically Signed   By: Roque Lias M.D.   On:  08/09/2013 09:25   Mr [name] Head Wo Contrast  08/09/2013   CLINICAL DATA:  History of polysubstance abuse including cocaine and THC use, positive on urine drug screen 2 weeks ago. Acute onset of unsteadiness, with slurred speech and restricted upward gaze.  EXAM: MRI HEAD WITHOUT CONTRAST  MRA HEAD WITHOUT CONTRAST  TECHNIQUE: Multiplanar, multiecho pulse sequences of the brain and surrounding structures were obtained without intravenous contrast. Angiographic images of the head were obtained using MRA technique without contrast.  COMPARISON:  CT head 08/09/2013.  FINDINGS: MRI HEAD FINDINGS  No evidence for acute infarction, hemorrhage, mass lesion, hydrocephalus, or extra-axial fluid. There is no atrophy or white matter disease. Flow voids are maintained. No chronic hemorrhage. No midline abnormality. Mild chronic sinus disease without mastoid fluid or orbital findings. No osseous lesions.  MRA HEAD FINDINGS  The internal carotid arteries are widely patent. There is moderate disease of the A1 ACA on the RIGHT. LEFT A1 ACA unremarkable. Normal-appearing proximal and distal MCA branches.  There is severe vasoconstriction or arteritis of the posterior circulation. The LEFT vertebral is dominant. There is a long segment of basilar narrowing nearly 1.5 cm length, out of proportion to the degree of basilar hypoplasia which might be expected from BILATERAL fetal PCA carotid origins. There is a severe proximal focal stenosis estimated 75-90% of the basilar just above the anterior inferior cerebellar artery origins. Drug induced arteritis or reversible cerebral vasoconstrictive syndrome (RCVS) are the most likely considerations. No visible subarachnoid hemorrhage on CT or MR to suggest vasospasm as result of blood. RIGHT vertebral small but apparently ending in PICA. No demonstrable flow in either superior cerebellar artery, anterior inferior cerebellar artery, or PICA.  IMPRESSION: No visible acute stroke or intracranial  parenchymal/subarachnoid hemorrhage.  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 Concern raised for drug-induced arteritis or reversible cerebral vasoconstrictive syndrome.   Electronically Signed   By: Unice Bailey.D.  On: 08/09/2013 13:44   Mr Brain Wo Contrast  08/09/2013   CLINICAL DATA:  History of polysubstance abuse including cocaine and THC use, positive on urine drug screen 2 weeks ago. Acute onset of unsteadiness, with slurred speech and restricted upward gaze.  EXAM: MRI HEAD WITHOUT CONTRAST  MRA HEAD WITHOUT CONTRAST  TECHNIQUE: Multiplanar, multiecho pulse sequences of the brain and surrounding structures were obtained without intravenous contrast. Angiographic images of the head were obtained using MRA technique without contrast.  COMPARISON:  CT head 08/09/2013.  FINDINGS: MRI HEAD FINDINGS  No evidence for acute infarction, hemorrhage, mass lesion, hydrocephalus, or extra-axial fluid. There is no atrophy or white matter disease. Flow voids are maintained. No chronic hemorrhage. No midline abnormality. Mild chronic sinus disease without mastoid fluid or orbital findings. No osseous lesions.  MRA HEAD FINDINGS  The internal carotid arteries are widely patent. There is moderate disease of the A1 ACA on the RIGHT. LEFT A1 ACA unremarkable. Normal-appearing proximal and distal MCA branches.  There is severe vasoconstriction or arteritis of the posterior circulation. The LEFT vertebral is dominant. There is a long segment of basilar narrowing nearly 1.5 cm length, out of proportion to the degree of basilar hypoplasia which might be expected from BILATERAL fetal PCA carotid origins. There is a severe proximal focal stenosis estimated 75-90% of the basilar just above the anterior inferior cerebellar artery origins. Drug induced arteritis or reversible cerebral vasoconstrictive syndrome (RCVS) are the most likely considerations. No visible subarachnoid hemorrhage  on CT or MR to suggest vasospasm as result of blood. RIGHT vertebral small but apparently ending in PICA. No demonstrable flow in either superior cerebellar artery, anterior inferior cerebellar artery, or PICA.  IMPRESSION: No visible acute stroke or intracranial parenchymal/subarachnoid hemorrhage.  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 Concern raised for drug-induced arteritis or reversible cerebral vasoconstrictive syndrome.   Electronically Signed   By: Davonna Belling M.D.   On: 08/09/2013 13:44    Other results: EKG: sinus bradycardia, RBBB, ST depression in V1, these are new in the setting of a previous EKG on 07/29/13 that showed NSR.  Assessment & Plan by Problem:  # TIA vs stroke vs posterior circulation insufficiency: Pt appeared to have new onset of dysarthria and dysphagia during an early meeting. Code stroke was called and neuro evaluated where by patient's symptoms had resolved. Ct head negative. MRI showed some diffuse narrowing of the basilar artery which would be in line with concern for circulation deficints in the setting of cocaine abuse. Pt does have some new EKG changes indicating possible etiology with arrythmia/presyncope as well also in the setting of previous polysubstance abuse. Initial troponin is negative.  -risk stratification labs: HgbA1c, FLP -echo, carotid dopplers, trops x3 pending -ASA and add statin  -continuous tele -PT/OT consult -may need cardiology evaluation in setting of new EKG  #Schizoaffective disorder/depression - Sees psychiatrist at [facility]. Currently taking risperidone 4mg  daily and fluoxetine 20mg  daily. Pt has been hearing voices recently but says they are benign at the moment and he doesn't have thoughts of hurting himself or others. -Continue home risperidone and fluoxetine  #Substance abuse- Drug screen negative today and positive two weeks ago which is consistent with patient report. History of cocaine,  alcohol, tobacco, marijuana use. Currently staying at the [facility] center and per patient completely drug free. -Discharge back to [facility] center  Dispo: Disposition is deferred at this time, awaiting improvement of current medical problems. Anticipated discharge  in approximately 1-2 day(s).   The patient does not have a current PCP (No Pcp Per Patient) and does need an [facility] hospital follow-up appointment after discharge.  The patient does not have transportation limitations that hinder transportation to clinic appointments.  Signed: Christen Bame, MD 08/09/2013, 5:42 PM

## 2013-08-09 NOTE — ED Notes (Signed)
Received pt from [facility]. [facility] brought pt. Per report pt was started feeling dizzy, having slurred speech, and felt weak around 0745. Pt states he took his meds this morning but could not recall what it was. Pt [facility] resident at [facility]. Pt has had a HA for 3 days. Pt EKG shows SR. CBG 151, BP 131/78, HR 92. CT from med center [facility] neg. Pt is alert and oriented

## 2013-08-09 NOTE — ED Notes (Addendum)
Sudden onset generalized weakness, left facial numbness, slurred speech and difficulty controlling saliva.  Pt from [facility], states he has been clean for 23 days.  Pt's eyes are red.  Noted slowed response to questions, difficulty following with eyes, left sided facial numbness, right sided arm and leg sensation.  Generalized weakness upper and lower extremeties.  Pt states he has had headache for several days.

## 2013-08-09 NOTE — ED Provider Notes (Signed)
Pt transferred from [facility] for acute stroke evaluation.  Pt is alert awake.  Stroke team is evaluating the patient at the bedside.  Linwood DibblesJon Rajvi Armentor, MD 08/09/13 1011

## 2013-08-09 NOTE — Progress Notes (Signed)
Patient arrived to unit.  Patient alert, oriented, verbally responsive, breathing regular and non-labored throughout, no s/s of distress noted throughout, no c/o pain throughout.  Patient oriented to unit and room, needs attended to.  VS WNL.  Will continue to monitor.  Lonnie [name],Lonnie [name] 08/09/2013 1:46 PM

## 2013-08-09 NOTE — ED Notes (Signed)
Pt is in MRI  

## 2013-08-09 NOTE — ED Notes (Signed)
[facility] given [facility] [place] [facility].

## 2013-08-09 NOTE — ED Notes (Signed)
[facility] arrived to transport patient to [place]

## 2013-08-09 NOTE — Progress Notes (Signed)
[facility].  Patient to be transferred to 3W10 after MRI.  Awaiting patients arrival.  Reita ClicheWilliams,Terrilee Dudzik 08/09/2013 12:33 PM

## 2013-08-09 NOTE — Consult Note (Addendum)
Referring Physician: ED    Chief Complaint: CODE STROKE, RIGHT FACE NUMBNESS, IMBALANCE, DYSARTHRIA.  HPI:                                                                                                                                         Lonnie [name] is an 48 y.o. male with a past medical history significant for polysubstance abuse on detox (clean for 23 days), depression, GERD, transferred from [facility] by ambulance as a code stroke due to acute onset of the above stated symptoms. He stated that he woke up at 5 am feeling well, went back to bed, and he got up from bed at 745 " I was very off balance, unsteady all over the place". Then, he noticed numbness of the right side of his face. Upon arrival to [facility] he was noted to have " subtle dysarthria and some restriction of upward gaze" and thus a code stroke was activated. CT brain showed no acute intracranial abnormality. NIHSS 1 (SENSORY RIGHT FACE). At this, he complains of HA that has been present for the last 3 days. Also pain right leg and generalized weakness. Denies vertigo, double vision, difficulty swallowing, slurred speech, language or vision impairment.  Date last known well: 08/09/13 Time last known well: 745 am tPA Given: no, mild deficits NIHSS: 1 (sensory right face)  Past Medical History  Diagnosis Date  . GERD (gastroesophageal reflux disease)   . Depression   . Lumbago   . ETOH abuse   . Cocaine abuse   . Xanax use disorder, mild, abuse   . Marijuana abuse   . Schizophrenia     Past Surgical History  Procedure Laterality Date  . Back surgery    . Neck surgery    . Esophagogastroduodenoscopy (egd) with propofol N/A 07/30/2013    Procedure: ESOPHAGOGASTRODUODENOSCOPY (EGD) WITH PROPOFOL;  Surgeon: Missy Sabins, MD;  Location: [facility] ENDOSCOPY;  Service: Endoscopy;  Laterality: N/A;    Family History  Problem Relation Age of Onset  . Diabetes Mother   . Hypertension Mother   . Hyperlipidemia Father   . Heart  attack Neg Hx   . Sudden death Neg Hx    Social History:  reports that he has been smoking Cigarettes.  He has a 7.5 pack-year smoking history. He has never used smokeless tobacco. He reports that he drinks alcohol. He reports that he uses illicit drugs (Cocaine and Marijuana).  Allergies:  Allergies  Allergen Reactions  . Ibuprofen Itching    Medications:  I have reviewed the patient's current medications.  ROS:                                                                                                                                       History obtained from the patient and chart review.  General ROS: negative for - chills, fatigue, fever, night sweats, weight gain or weight loss Psychological ROS: negative for - behavioral disorder, hallucinations, memory difficulties, mood swings or suicidal ideation Ophthalmic ROS: negative for - blurry vision, double vision, eye pain or loss of vision ENT ROS: negative for - epistaxis, nasal discharge, oral lesions, sore throat, tinnitus or vertigo Allergy and Immunology ROS: negative for - hives or itchy/watery eyes Hematological and Lymphatic ROS: negative for - bleeding problems, bruising or swollen lymph nodes Endocrine ROS: negative for - galactorrhea, hair pattern changes, polydipsia/polyuria or temperature intolerance Respiratory ROS: negative for - cough, hemoptysis, shortness of breath or wheezing Cardiovascular ROS: negative for - chest pain, dyspnea on exertion, edema or irregular heartbeat Gastrointestinal ROS: negative for - abdominal pain, diarrhea, hematemesis, nausea/vomiting or stool incontinence Genito-Urinary ROS: negative for - dysuria, hematuria, incontinence or urinary frequency/urgency Musculoskeletal ROS: negative for - joint swelling or muscular weakness Neurological ROS: as noted in  HPI Dermatological ROS: negative for rash and skin lesion changes  Physical exam: pleasant male in no apparent distress. Blood pressure 130/73, pulse 95, temperature 98.2 F (36.8 C), temperature source Oral, resp. rate 17, SpO2 95.00%. Head: normocephalic. Neck: supple, no bruits, no JVD. Cardiac: no murmurs. Lungs: clear. Abdomen: soft, no tender, no mass. Extremities: no edema. Neurologic Examination:                                                                                                      General: Mental Status: Alert, oriented, thought content appropriate.  Speech fluent without evidence of aphasia.  Able to follow 3 step commands without difficulty. Cranial Nerves: II: Discs flat bilaterally; Visual fields grossly normal, pupils equal, round, reactive to light and accommodation III,IV, VI: ptosis not present, extra-ocular motions intact bilaterally V,VII: smile symmetric, facial light touch sensation normal bilaterally VIII: hearing normal bilaterally IX,X: gag reflex present XI: bilateral shoulder shrug XII: midline tongue extension without atrophy or fasciculations Motor: Right : Upper extremity   5/5    Left:     Upper extremity   5/5  Lower extremity   5/5     Lower extremity   5/5 Tone and bulk:normal tone throughout; no  atrophy noted Sensory: Pinprick and light touch diminished  Deep Tendon Reflexes:  Right: Upper Extremity   Left: Upper extremity   biceps (C-5 to C-6) 2/4   biceps (C-5 to C-6) 2/4 tricep (C7) 2/4    triceps (C7) 2/4 Brachioradialis (C6) 2/4  Brachioradialis (C6) 2/4  Lower Extremity Lower Extremity  quadriceps (L-2 to L-4) 2/4   quadriceps (L-2 to L-4) 2/4 Achilles (S1) 2/4   Achilles (S1) 2/4  Plantars: Right: downgoing   Left: downgoing Cerebellar: normal finger-to-nose,  normal heel-to-shin test Gait:  No tested     Results for orders placed during the hospital encounter of 08/09/13 (from the past 48 hour(s))  CBG  MONITORING, ED     Status: Abnormal   Collection Time    08/09/13  8:46 AM      Result Value Ref Range   Glucose-Capillary 151 (*) 70 - 99 mg/dL  ETHANOL     Status: None   Collection Time    08/09/13  9:00 AM      Result Value Ref Range   Alcohol, Ethyl (B) <11  0 - 11 mg/dL   Comment:            LOWEST DETECTABLE LIMIT FOR     SERUM ALCOHOL IS 11 mg/dL     FOR MEDICAL PURPOSES ONLY  PROTIME-INR     Status: None   Collection Time    08/09/13  9:00 AM      Result Value Ref Range   Prothrombin Time 13.2  11.6 - 15.2 seconds   INR 1.00  0.00 - 1.49  APTT     Status: None   Collection Time    08/09/13  9:00 AM      Result Value Ref Range   aPTT 26  24 - 37 seconds  COMPREHENSIVE METABOLIC PANEL     Status: Abnormal   Collection Time    08/09/13  9:00 AM      Result Value Ref Range   Sodium 136 (*) 137 - 147 mEq/L   Potassium 4.7  3.7 - 5.3 mEq/L   Chloride 96  96 - 112 mEq/L   CO2 27  19 - 32 mEq/L   Glucose, Bld 145 (*) 70 - 99 mg/dL   BUN 13  6 - 23 mg/dL   Creatinine, Ser 1.20  0.50 - 1.35 mg/dL   Calcium 9.9  8.4 - 10.5 mg/dL   Total Protein 7.5  6.0 - 8.3 g/dL   Albumin 3.8  3.5 - 5.2 g/dL   AST 38 (*) 0 - 37 U/L   ALT 49  0 - 53 U/L   Alkaline Phosphatase 51  39 - 117 U/L   Total Bilirubin 0.6  0.3 - 1.2 mg/dL   GFR calc non Af Amer 70 (*) >90 mL/min   GFR calc Af Amer 82 (*) >90 mL/min   Comment: (NOTE)     The eGFR has been calculated using the CKD EPI equation.     This calculation has not been validated in all clinical situations.     eGFR's persistently <90 mL/min signify possible Chronic Kidney     Disease.   Anion gap 13  5 - 15  CBC WITH DIFFERENTIAL     Status: Abnormal   Collection Time    08/09/13  9:00 AM      Result Value Ref Range   WBC 5.0  4.0 - 10.5 K/uL   RBC 4.98  4.22 - 5.81 MIL/uL  Hemoglobin 14.1  13.0 - 17.0 g/dL   HCT 41.4  39.0 - 52.0 %   MCV 83.1  78.0 - 100.0 fL   MCH 28.3  26.0 - 34.0 pg   MCHC 34.1  30.0 - 36.0 g/dL   RDW  15.2  11.5 - 15.5 %   Platelets 283  150 - 400 K/uL   Neutrophils Relative % 48  43 - 77 %   Neutro Abs 2.4  1.7 - 7.7 K/uL   Lymphocytes Relative 33  12 - 46 %   Lymphs Abs 1.7  0.7 - 4.0 K/uL   Monocytes Relative 15 (*) 3 - 12 %   Monocytes Absolute 0.7  0.1 - 1.0 K/uL   Eosinophils Relative 3  0 - 5 %   Eosinophils Absolute 0.2  0.0 - 0.7 K/uL   Basophils Relative 1  0 - 1 %   Basophils Absolute 0.1  0.0 - 0.1 K/uL  TROPONIN I     Status: None   Collection Time    08/09/13  9:00 AM      Result Value Ref Range   Troponin I <0.30  <0.30 ng/mL   Comment:            Due to the release kinetics of cTnI,     a negative result within the first hours     of the onset of symptoms does not rule out     myocardial infarction with certainty.     If myocardial infarction is still suspected,     repeat the test at appropriate intervals.  URINE RAPID DRUG SCREEN (HOSP PERFORMED)     Status: None   Collection Time    08/09/13  9:16 AM      Result Value Ref Range   Opiates NONE DETECTED  NONE DETECTED   Cocaine NONE DETECTED  NONE DETECTED   Benzodiazepines NONE DETECTED  NONE DETECTED   Amphetamines NONE DETECTED  NONE DETECTED   Tetrahydrocannabinol NONE DETECTED  NONE DETECTED   Barbiturates NONE DETECTED  NONE DETECTED   Comment:            DRUG SCREEN FOR MEDICAL PURPOSES     ONLY.  IF CONFIRMATION IS NEEDED     FOR ANY PURPOSE, NOTIFY LAB     WITHIN 5 DAYS.                LOWEST DETECTABLE LIMITS     FOR URINE DRUG SCREEN     Drug Class       Cutoff (ng/mL)     Amphetamine      1000     Barbiturate      200     Benzodiazepine   867     Tricyclics       672     Opiates          300     Cocaine          300     THC              50  URINALYSIS, ROUTINE W REFLEX MICROSCOPIC     Status: None   Collection Time    08/09/13  9:16 AM      Result Value Ref Range   Color, Urine YELLOW  YELLOW   APPearance CLEAR  CLEAR   Specific Gravity, Urine 1.014  1.005 - 1.030   pH 7.0  5.0  - 8.0   Glucose, UA NEGATIVE  NEGATIVE mg/dL   Hgb urine dipstick NEGATIVE  NEGATIVE   Bilirubin Urine NEGATIVE  NEGATIVE   Ketones, ur NEGATIVE  NEGATIVE mg/dL   Protein, ur NEGATIVE  NEGATIVE mg/dL   Urobilinogen, UA 0.2  0.0 - 1.0 mg/dL   Nitrite NEGATIVE  NEGATIVE   Leukocytes, UA NEGATIVE  NEGATIVE   Comment: MICROSCOPIC NOT DONE ON URINES WITH NEGATIVE PROTEIN, BLOOD, LEUKOCYTES, NITRITE, OR GLUCOSE <1000 mg/dL.   Ct Head Wo Contrast  08/09/2013   CLINICAL DATA:  Slurred speech, weakness.  EXAM: CT HEAD WITHOUT CONTRAST  TECHNIQUE: Contiguous axial images were obtained from the base of the skull through the vertex without intravenous contrast.  COMPARISON:  CT scan of September 06, 2011.  FINDINGS: Bony calvarium appears intact. No mass effect or midline shift is noted. Ventricular size is within normal limits. There is no evidence of mass lesion, hemorrhage or acute infarction.  IMPRESSION: Normal head CT.   Electronically Signed   By: Sabino Dick M.D.   On: 08/09/2013 09:25    Assessment: 48 y.o. male with history of polysubstance abuse, brought in as a code stroke due to acute onset unsteadiness, ? subtle slurred speech, and restricted upward gaze (no present at this moment), and right face numbness. NIHSS 1, CT brain negative. No a candidate for thrombolytic therapy due to NIHSS 1 (sensory right face). Considering h/o cocaine abuse, will consider posterior circulation infarct and thus will get stroke work up.  Dorian Pod, MD [facility] [phone]  08/09/2013, 10:26 AM  Addendum: MRI brain showed no acute infarct or visible hemorrhage.  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 Concern raised for drug-induced arteritis or reversible cerebral vasoconstrictive syndrome, although the history of cocaine abuse and current presentation with HA and focal neurological symptoms make me think that RCVS could be more  likely.  Discussed with neuroradiologist. Although catheter angiography remains the gold standard test to demonstrate the characteristic 'string of beads' pattern of alternating areas of arterial stenosis and dilation in cases where noninvasive vascular imaging is inconclusive, patient has no evidence of infarct or hemorrhage on neuro-imaging at this moment.  Cerebral angio in am. In the meantime, will start nimodipine for possible RCVS.  Dorian Pod, MD

## 2013-08-10 DIAGNOSIS — R471 Dysarthria and anarthria: Secondary | ICD-10-CM

## 2013-08-10 DIAGNOSIS — I519 Heart disease, unspecified: Secondary | ICD-10-CM

## 2013-08-10 LAB — BASIC METABOLIC PANEL
Anion gap: 14 (ref 5–15)
BUN: 10 mg/dL (ref 6–23)
CHLORIDE: 97 meq/L (ref 96–112)
CO2: 25 meq/L (ref 19–32)
Calcium: 8.9 mg/dL (ref 8.4–10.5)
Creatinine, Ser: 1.08 mg/dL (ref 0.50–1.35)
GFR calc Af Amer: >90 mL/min (ref >90)
GFR, EST NON AFRICAN AMERICAN: 80 mL/min — AB (ref >90)
Glucose, Bld: 154 mg/dL — ABNORMAL HIGH (ref 70–99)
POTASSIUM: 4.4 meq/L (ref 3.7–5.3)
SODIUM: 136 meq/L — AB (ref 137–147)

## 2013-08-10 LAB — HEMOGLOBIN A1C
Hgb A1c MFr Bld: 5.9 % — ABNORMAL HIGH (ref <5.7)
Mean Plasma Glucose: 123 mg/dL — ABNORMAL HIGH (ref <117)

## 2013-08-10 LAB — TROPONIN I
Troponin I: <0.3 ng/mL (ref <0.30)
Troponin I: <0.3 ng/mL (ref <0.30)

## 2013-08-10 MED ORDER — RISPERIDONE 2 MG PO TABS
4.0000 mg | ORAL_TABLET | Freq: Every day | ORAL | Status: DC
  Filled 2013-08-10: qty 2

## 2013-08-10 MED ORDER — ASPIRIN 81 MG PO TBEC: 81.0000 mg | DELAYED_RELEASE_TABLET | Freq: Every day | ORAL | Status: DC

## 2013-08-10 MED ORDER — ATORVASTATIN CALCIUM 40 MG PO TABS: 40.0000 mg | ORAL_TABLET | Freq: Every day | ORAL | Status: DC

## 2013-08-10 MED ORDER — FLUOXETINE HCL 20 MG PO CAPS
20.0000 mg | ORAL_CAPSULE | Freq: Every day | ORAL | Status: DC
  Administered 2013-08-10: 20 mg via ORAL
  Filled 2013-08-10: qty 1

## 2013-08-10 NOTE — H&P (Signed)
Attending physician admission note: I personally examined this patient and reviewed all pertinent laboratory and radiographic data. History, physical findings, problem assessment, and management plan, accurate as recorded by resident physician Dr. Christen BameNora Sadek and fourth year medical student Dr. Joycelyn DasKaleb Kyserling. Clinical summary: 48 year old man with polysubstance abuse including cocaine use currently in a detoxification program. He was at one of his rehabilitation classes when he had sudden onset of weakness, right facial numbness, transient blurred vision and dysarthria, and weakness of his right upper extremity. He had a 3 day prodrome of bitemporal headache. At time of arrival in the emergency department he was mildly dysarthric and appeared to have some restriction of upward gaze. CT of the brain showed no acute findings. 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 Findings felt to be compatible with drug induced arteritis or other reversible cerebral vasoconstrictive syndrome. No acute hemorrhage or thrombosis. He was seen in consultation by neurology. He is not felt to be a candidate for thrombolytic therapy. Symptoms resolved promptly with no residua. Further studies were recommended with respect to vertebral artery angiogram. The patient went to the radiology suite then declined to have the procedure done. Current exam: Tall, muscular, healthy-appearing, African American man  Blood pressure 126/76, pulse 81, temperature 97.7 F (36.5 C), temperature source Oral, resp. rate 18, SpO2 100.00%. He is alert, oriented, PERRLA, full extraocular movements, tongue midline, no facial asymmetry, carotid pulses 2+ symmetric, no bruits, motor strength 5 over 5 upper and lower  extremities, upper body coordination, rapid alternating movements, finger to finger, all normal. Gait normal. 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 Likely drug related (cocaine). Disposition: I reviewed the MRI images with the patient to accentuate to him that he was not getting blood flow to the back of his brain on the right side. I told him this was likely due to his cocaine use. He has now stopped abusing and is in a rehabilitation program. Fortunately this appears to have been a reversible process. He has declined further diagnostic studies and is otherwise stable for discharge today.

## 2013-08-10 NOTE — Discharge Summary (Signed)
Name: Lonnie [name] MRN: [id number] DOB: 1965/05/06 48 y.o. PCP: No Pcp Per Patient  Date of Admission: 08/09/2013  8:36 AM Date of Discharge: 08/10/2013 Attending Physician: Levert Feinstein, MD  Discharge Diagnosis: 1.Reversible cerebral vasoconstriction syndromes 2. Schizoaffective disorder  Discharge Medications:   Medication List    ASK your doctor about these medications       cyclobenzaprine 10 MG tablet  Commonly known as:  FLEXERIL  Take 1 tablet (10 mg total) by mouth 3 (three) times daily.     FLUoxetine 20 MG capsule  Commonly known as:  PROZAC  Take 1 capsule (20 mg total) by mouth daily.     prazosin 1 MG capsule  Commonly known as:  MINIPRESS  Take 1 capsule (1 mg total) by mouth at bedtime.     risperidone 4 MG tablet  Commonly known as:  RISPERDAL  Take 4 mg by mouth at bedtime.        Disposition and follow-up:   Mr.Lonnie LAMORRIS [name] was discharged from [facility] in Stable condition.  At the hospital follow up visit please address:  1.  Pt resolution of neurological symptoms possible consideration of CCB (nimodipine) for PRVS  2.  Labs / imaging needed at time of follow-up: none  3.  Pending labs/ test needing follow-up: none   Consultations: Treatment Team:  Kym Groom, MD Md Stroke, MD  Procedures Performed:  Dg Chest 2 View  08/09/2013   CLINICAL DATA:  Stroke, chest tightness, RIGHT side weakness, near syncope for 1 day, history GERD, smoking  EXAM: CHEST  2 VIEW  COMPARISON:  03/06/2013  FINDINGS: Normal heart size, mediastinal contours, and pulmonary vascularity.  Lungs clear.  No pneumothorax.  Bones unremarkable.  IMPRESSION: Normal exam.   Electronically Signed   By: Ulyses Southward M.D.   On: 08/09/2013 16:01   Dg Abd 1 View  07/30/2013   CLINICAL DATA:  Abdominal pain.  Hematemesis.  EXAM: ABDOMEN - 1 VIEW  COMPARISON:  None.  FINDINGS: No evidence of dilated bowel loops. Large stool burden seen throughout the  colon. No evidence of free air. No radiopaque calculi identified. Pelvic vascular calcification noted.  IMPRESSION: No acute findings.  Large stool burden noted; suggest clinical correlation for possible constipation.   Electronically Signed   By: Myles Rosenthal M.D.   On: 07/30/2013 01:13   Ct Head Wo Contrast  08/09/2013   CLINICAL DATA:  Slurred speech, weakness.  EXAM: CT HEAD WITHOUT CONTRAST  TECHNIQUE: Contiguous axial images were obtained from the base of the skull through the vertex without intravenous contrast.  COMPARISON:  CT scan of September 06, 2011.  FINDINGS: Bony calvarium appears intact. No mass effect or midline shift is noted. Ventricular size is within normal limits. There is no evidence of mass lesion, hemorrhage or acute infarction.  IMPRESSION: Normal head CT.   Electronically Signed   By: Roque Lias M.D.   On: 08/09/2013 09:25   Mr [name] Head Wo Contrast  08/09/2013   CLINICAL DATA:  History of polysubstance abuse including cocaine and THC use, positive on urine drug screen 2 weeks ago. Acute onset of unsteadiness, with slurred speech and restricted upward gaze.  EXAM: MRI HEAD WITHOUT CONTRAST  MRA HEAD WITHOUT CONTRAST  TECHNIQUE: Multiplanar, multiecho pulse sequences of the brain and surrounding structures were obtained without intravenous contrast. Angiographic images of the head were obtained using MRA technique without contrast.  COMPARISON:  CT head 08/09/2013.  FINDINGS:  MRI HEAD FINDINGS  No evidence for acute infarction, hemorrhage, mass lesion, hydrocephalus, or extra-axial fluid. There is no atrophy or white matter disease. Flow voids are maintained. No chronic hemorrhage. No midline abnormality. Mild chronic sinus disease without mastoid fluid or orbital findings. No osseous lesions.  MRA HEAD FINDINGS  The internal carotid arteries are widely patent. There is moderate disease of the A1 ACA on the RIGHT. LEFT A1 ACA unremarkable. Normal-appearing proximal and distal MCA  branches.  There is severe vasoconstriction or arteritis of the posterior circulation. The LEFT vertebral is dominant. There is a long segment of basilar narrowing nearly 1.5 cm length, out of proportion to the degree of basilar hypoplasia which might be expected from BILATERAL fetal PCA carotid origins. There is a severe proximal focal stenosis estimated 75-90% of the basilar just above the anterior inferior cerebellar artery origins. Drug induced arteritis or reversible cerebral vasoconstrictive syndrome (RCVS) are the most likely considerations. No visible subarachnoid hemorrhage on CT or MR to suggest vasospasm as result of blood. RIGHT vertebral small but apparently ending in PICA. No demonstrable flow in either superior cerebellar artery, anterior inferior cerebellar artery, or PICA.  IMPRESSION: No visible acute stroke or intracranial parenchymal/subarachnoid hemorrhage.  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 Concern raised for drug-induced arteritis or reversible cerebral vasoconstrictive syndrome.   Electronically Signed   By: Davonna BellingJohn  Curnes M.D.   On: 08/09/2013 13:44   Mr Brain Wo Contrast  08/09/2013   CLINICAL DATA:  History of polysubstance abuse including cocaine and THC use, positive on urine drug screen 2 weeks ago. Acute onset of unsteadiness, with slurred speech and restricted upward gaze.  EXAM: MRI HEAD WITHOUT CONTRAST  MRA HEAD WITHOUT CONTRAST  TECHNIQUE: Multiplanar, multiecho pulse sequences of the brain and surrounding structures were obtained without intravenous contrast. Angiographic images of the head were obtained using MRA technique without contrast.  COMPARISON:  CT head 08/09/2013.  FINDINGS: MRI HEAD FINDINGS  No evidence for acute infarction, hemorrhage, mass lesion, hydrocephalus, or extra-axial fluid. There is no atrophy or white matter disease. Flow voids are maintained. No chronic hemorrhage. No midline abnormality. Mild chronic  sinus disease without mastoid fluid or orbital findings. No osseous lesions.  MRA HEAD FINDINGS  The internal carotid arteries are widely patent. There is moderate disease of the A1 ACA on the RIGHT. LEFT A1 ACA unremarkable. Normal-appearing proximal and distal MCA branches.  There is severe vasoconstriction or arteritis of the posterior circulation. The LEFT vertebral is dominant. There is a long segment of basilar narrowing nearly 1.5 cm length, out of proportion to the degree of basilar hypoplasia which might be expected from BILATERAL fetal PCA carotid origins. There is a severe proximal focal stenosis estimated 75-90% of the basilar just above the anterior inferior cerebellar artery origins. Drug induced arteritis or reversible cerebral vasoconstrictive syndrome (RCVS) are the most likely considerations. No visible subarachnoid hemorrhage on CT or MR to suggest vasospasm as result of blood. RIGHT vertebral small but apparently ending in PICA. No demonstrable flow in either superior cerebellar artery, anterior inferior cerebellar artery, or PICA.  IMPRESSION: No visible acute stroke or intracranial parenchymal/subarachnoid hemorrhage.  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 Concern raised for drug-induced arteritis or reversible cerebral vasoconstrictive syndrome.   Electronically Signed   By: Davonna BellingJohn  Curnes M.D.   On: 08/09/2013 13:44    2D Echo: - Left ventricle: The cavity size was normal.  Wall thickness was normal. Systolic function was normal. The estimated ejection fraction was in the range of 60% to 65%. Wall motion was normal; there were no regional wall motion abnormalities. Doppler parameters are consistent with abnormal left ventricular relaxation (grade 1 diastolic dysfunction).  Impressions: - No cardiac source of emboli was indentified.  Admission HPI: ZIGMOND TRELA is a 48 yo with PMH of polysubstance abuse on detox (clean for 23 days), depression,  schizophrenia, GERD who was in his normal state of health until this morning at around 7:45am when he felt nauseated after standing up during a meeting at the [facility] house he is staying at for substance abuse treatment. He described feeling weak, right sided fascial numbness, inability to keep his eyes open, and was drooling. He had non-specific blurry vision in both eyes and was slurring his words for a couple hours after the event. He also describes feeling week in his right upper extremities. He denies loss of consciousness or urine/stool incontinence. Patent states that his symptoms lasted until shortly after he arrived at the hospital. He denies any of these symptoms currently.  Of note, pt complains of a bilateral throbbing headache of 3 days duration in the temporal region that improves when he hold pressure to the area. He states that it is constant and does not improve with rest. He denies chest pain, shortness of breath. He has had some nausea with dry heaving but no emesis. He also reports that he has been hearing non-threatening voices recently, but none today.   Hospital Course by problem list: # TIA vs Reversible cerebral vasoconstriction syndrome: Pt appeared to have new onset of dysarthria and dysphagia during meeting on the morning of 08/09/13 and still had symptoms when he arrived at the ED including "subtle dysarthria and some restriction of upward gaze". A code stroke was called and neuro evaluated the patient but at that point his symptoms had resolved. Ct head was negative and chest x-ray were normal.  MRI showed some diffuse narrowing of the basilar artery which is concerning for drug induce arteritis vs reversible cerebral vasoconstriction syndrome. Echocardiogram did not identify any cardiac source of emboli. Pt did have a new right bundle branch block with V1 T wave inversion on ECG. Troponins x3 were negative. He was started on nimodipine for his potential RCVS and maintained on  continuous telemetry. PT/OT evaluated pt and found no residual deficits. His Hg A1C was 5.9. The morning after admission he was supposed to have an IR angiogram to evaluate posterior circulation but the pt refused. As he had returned to his normal baseline he was discharged on aspirin 81 mg and atorvastatin 40 mg for secondary stroke prevention.   #Schizoaffective disorder/depression - Patient see a psychiatrist at [facility]. Currently taking risperidone 4mg  daily and fluoxetine 20mg  daily which were continued during hospitalization. Pt has been hearing voices recently but says they are benign at the moment and he doesn't have thoughts of hurting himself or others.  #Substance abuse- History of cocaine, alcohol, tobacco, marijuana use. Drug screen was negative on admission and positive two weeks ago which is consistent with patient's report. He planned to return to the [facility] center on discharge  Discharge Vitals:   BP 126/76  Pulse 81  Temp(Src) 97.7 F (36.5 C) (Oral)  Resp 18  SpO2 100% General: walking the halls, well appearing, in no acute distress  HEENT: PERRLA, moist mucus membranes  Heart: Regular rate and rhythm, no murmurs  Lungs: CTAB, normal work of  breathing  Abdomen: Normal active bowel sounds, non-tender, non-distended  Extremities: no peripheral edema, pulses 2+ bilaterally  Skin: Pt has an acne like bump no his back that is firm and mildly tender on palpation  Neurologic A&O x 3, vission grossly intact, no visual field deficits, CN III-XII intact. 5/5 strength upper and lower extremities bilaterally, reflexes 2+ upper lower extremities bilaterally, rapid alternating movements and finger to nose tests normal. Sensation intact. Gait is normal.   Discharge Labs:  Results for orders placed during the hospital encounter of 08/09/13 (from the past 24 hour(s))  CBC     Status: None   Collection Time    08/09/13  3:30 PM      Result Value Ref Range   WBC 5.5  4.0 - 10.5 K/uL   RBC  4.76  4.22 - 5.81 MIL/uL   Hemoglobin 13.5  13.0 - 17.0 g/dL   HCT 16.1  09.6 - 04.5 %   MCV 83.6  78.0 - 100.0 fL   MCH 28.4  26.0 - 34.0 pg   MCHC 33.9  30.0 - 36.0 g/dL   RDW 40.9  81.1 - 91.4 %   Platelets 270  150 - 400 K/uL  CREATININE, SERUM     Status: Abnormal   Collection Time    08/09/13  3:30 PM      Result Value Ref Range   Creatinine, Ser 1.04  0.50 - 1.35 mg/dL   GFR calc non Af Amer 84 (*) >90 mL/min   GFR calc Af Amer >90  >90 mL/min  TROPONIN I     Status: None   Collection Time    08/09/13  7:13 PM      Result Value Ref Range   Troponin I <0.30  <0.30 ng/mL  HEMOGLOBIN A1C     Status: Abnormal   Collection Time    08/10/13 12:10 AM      Result Value Ref Range   Hemoglobin A1C 5.9 (*) <5.7 %   Mean Plasma Glucose 123 (*) <117 mg/dL  TROPONIN I     Status: None   Collection Time    08/10/13 12:10 AM      Result Value Ref Range   Troponin I <0.30  <0.30 ng/mL  TROPONIN I     Status: None   Collection Time    08/10/13  3:18 AM      Result Value Ref Range   Troponin I <0.30  <0.30 ng/mL  BASIC METABOLIC PANEL     Status: Abnormal   Collection Time    08/10/13  7:14 AM      Result Value Ref Range   Sodium 136 (*) 137 - 147 mEq/L   Potassium 4.4  3.7 - 5.3 mEq/L   Chloride 97  96 - 112 mEq/L   CO2 25  19 - 32 mEq/L   Glucose, Bld 154 (*) 70 - 99 mg/dL   BUN 10  6 - 23 mg/dL   Creatinine, Ser 7.82  0.50 - 1.35 mg/dL   Calcium 8.9  8.4 - 95.6 mg/dL   GFR calc non Af Amer 80 (*) >90 mL/min   GFR calc Af Amer >90  >90 mL/min   Anion gap 14  5 - 15    Signed: Christen Bame, MD 08/10/2013, 11:57 AM    Services Ordered on Discharge: none Equipment Ordered on Discharge: none

## 2013-08-10 NOTE — Progress Notes (Signed)
Patient discharged to home.  Patient alert, oriented, verbally responsive, breathing regular and non-labored throughout, no s/s of distress noted throughout, no c/o pain throughout.  Discharge instructions thoroughly verbalized to patient.  Patient verbalized understanding throughout.  Patient refused wheelchair and left unit ambulating accompanied by [place] C[place].  VS WNL.  Reita ClicheWilliams,Christine Schiefelbein 08/10/2013

## 2013-08-10 NOTE — Progress Notes (Signed)
SLP Cancellation Note  Patient Details Name: Lonnie [name] MRN: [id number] DOB: 12/02/1965   Cancelled treatment:       Reason Eval/Treat Not Completed: SLP screened, no needs identified, will sign off   Blenda MountsCouture, Christyan Reger Laurice 08/10/2013, 11:53 AM

## 2013-08-10 NOTE — Progress Notes (Signed)
I have seen the patient and reviewed the daily progress note by Sylvester Harder MS 4 and discussed the care of the patient with them.  See below for documentation of my findings, assessment, and plans.  Subjective: Pt doing well this AM. [name]. Down for procedure.   Objective: Vital signs in last 24 hours: Filed Vitals:   08/10/13 0228 08/10/13 0400 08/10/13 0600 08/10/13 0850  BP: 121/70 141/87 122/74 126/76  Pulse: 95 100 88 81  Temp: 98.3 F (36.8 C) 98.7 F (37.1 C) 98 F (36.7 C) 97.7 F (36.5 C)  TempSrc: Oral Oral Oral   Resp: 18 18 18    SpO2: 96% 93% 98% 100%   Weight change:   Intake/Output Summary (Last 24 hours) at 08/10/13 1151 Last data filed at 08/10/13 0600  Gross per 24 hour  Intake 1212.5 ml  Output      0 ml  Net 1212.5 ml   General: resting in bed HEENT: PERRL, EOMI, no scleral icterus Cardiac: RRR, no rubs, murmurs or gallops Pulm: clear to auscultation bilaterally, moving normal volumes of air Abd: soft, nontender, nondistended, BS present Ext: warm and well perfused, no pedal edema Neuro: alert and oriented X3, cranial nerves II-XII grossly intact  Lab Results: Reviewed and documented in Electronic Record Micro Results: Reviewed and documented in Electronic Record Studies/Results: Reviewed and documented in Electronic Record Medications: I have reviewed the patient's current medications. Scheduled Meds: .  stroke: mapping our early stages of recovery book   Does not apply Once  . aspirin EC  81 mg Oral Daily  . FLUoxetine  20 mg Oral Daily  . heparin  5,000 Units Subcutaneous 3 times per day  . niMODipine  30 mg Oral 6 times per day  . pantoprazole  40 mg Oral BID  . pneumococcal 23 valent vaccine  0.5 mL Intramuscular Tomorrow-1000  . risperiDONE  4 mg Oral QHS   Continuous Infusions:  PRN Meds:.senna-docusate Assessment/Plan: # TIA vs posterior circulation insufficiency: Pt appeared to have new onset of dysarthria and dysphagia  during meeting yesterday. Code stroke was called and neuro evaluated where by patient's symptoms had resolved. Ct head negative. MRI showed some diffuse narrowing of the basilar artery which is concerning for drug induce arteritis vs reversible cerebral vasoconstriction syndrome which is most likely etiology in the setting of previous cocaine abuse. Pt does have some new EKG changes indicating possible etiology with arrythmia/presyncope as well also in the setting of previous polysubstance abuse. Troponin x3 are negative. PT/OT evaluated pt and found no residual deficits. Hgb A1C was 5.9.  - Echo, carotid dopplers pending  - ASA  - Continuous tele  - NPO for IR angio to evaluate posterior circulation  - Add atorvastatin for secondary stroke prevention  - Continue nimodipine RCVS   #Schizoaffective disorder/depression - Sees psychiatrist at [facility]. Currently taking risperidone 4mg  daily and fluoxetine 20mg  daily. Pt has been hearing voices recently but says they are benign at the moment and he doesn't have suicidadl or homicidal ideation.  -Continue home risperidone and fluoxetine   #Substance abuse- Drug screen negative on admission and positive two weeks ago which is consistent with patient's report. History of cocaine, alcohol, tobacco, marijuana use. Currently staying at the [facility] center and per patient completely drug free.   Dispo: Disposition is deferred at this time, awaiting improvement of current medical problems.  Anticipated discharge in approximately 1-2 day(s).   The patient does not have a current PCP (No Pcp Per Patient)  and does not need an [facility] hospital follow-up appointment after discharge.  The patient does not have transportation limitations that hinder transportation to clinic appointments.  .Services Needed at time of discharge: Y = Yes, Blank = No PT:   OT:   RN:   Equipment:   Other:     LOS: 1 day   Christen BameNora Lateesha Bezold, MD 08/10/2013, 11:51 AM

## 2013-08-10 NOTE — Progress Notes (Signed)
  Echocardiogram 2D Echocardiogram has been performed.  Lonnie [name] 08/10/2013, 10:31 AM

## 2013-08-10 NOTE — Progress Notes (Signed)
Subjective: [name]. Pt was walking the floor when I saw him this morning. He said he is doing well and doesn't have any lingering symptoms from yesterday. He denies vision changes, focal weakness, chest pain, shortness of breath. His headache has also improved from yesterday.  He will remain NPO until his IR procedure later today. He would like to return to the [facility] center when cleared to leave.  Objective: Vital signs in last 24 hours: Filed Vitals:   08/10/13 0228 08/10/13 0400 08/10/13 0600 08/10/13 0850  BP: 121/70 141/87 122/74 126/76  Pulse: 95 100 88 81  Temp: 98.3 F (36.8 C) 98.7 F (37.1 C) 98 F (36.7 C) 97.7 F (36.5 C)  TempSrc: Oral Oral Oral   Resp: 18 18 18    SpO2: 96% 93% 98% 100%   Weight change:   Intake/Output Summary (Last 24 hours) at 08/10/13 1014 Last data filed at 08/10/13 0600  Gross per 24 hour  Intake 1212.5 ml  Output      0 ml  Net 1212.5 ml   General: walking the halls, well appearing, in no acute distress  HEENT: PERRLA, moist mucus membranes  Heart: Regular rate and rhythm, no murmurs Lungs: CTAB, normal work of breathing  Abdomen: Normal active bowel sounds, non-tender, non-distended  Extremities: no peripheral edema, pulses 2+ bilaterally  Skin: Pt has an acne like bump no his back that is firm and mildly tender on palpation  Neurologic A&O x 3,vission grossly intact, no visual field deficits, CN III-XII intact. 5/5 strength upper and lower extremities bilaterally, reflexes 2+ upper lower extremities bilaterally, rapid alternating movements and finger to nose tests normal. Sensation intact. Gait is normal.     Lab Results: Basic Metabolic Panel:  Recent Labs  16/10/96 0900 08/09/13 1530 08/10/13 0714  NA 136*  --  136*  K 4.7  --  4.4  CL 96  --  97  CO2 27  --  25  GLUCOSE 145*  --  154*  BUN 13  --  10  CREATININE 1.20 1.04 1.08  CALCIUM 9.9  --  8.9   Liver Function Tests:  Recent Labs  08/09/13 0900  AST 38*    ALT 49  ALKPHOS 51  BILITOT 0.6  PROT 7.5  ALBUMIN 3.8   No results found for this basename: LIPASE, AMYLASE,  in the last 72 hours No results found for this basename: AMMONIA,  in the last 72 hours CBC:  Recent Labs  08/09/13 0900 08/09/13 1530  WBC 5.0 5.5  NEUTROABS 2.4  --   HGB 14.1 13.5  HCT 41.4 39.8  MCV 83.1 83.6  PLT 283 270   Cardiac Enzymes:  Recent Labs  08/09/13 1913 08/10/13 0010 08/10/13 0318  TROPONINI <0.30 <0.30 <0.30   BNP: No results found for this basename: PROBNP,  in the last 72 hours D-Dimer: No results found for this basename: DDIMER,  in the last 72 hours CBG:  Recent Labs  08/09/13 0846  GLUCAP 151*   Hemoglobin A1C:  Recent Labs  08/10/13 0010  HGBA1C 5.9*   Fasting Lipid Panel: No results found for this basename: CHOL, HDL, LDLCALC, TRIG, CHOLHDL, LDLDIRECT,  in the last 72 hours Thyroid Function Tests: No results found for this basename: TSH, T4TOTAL, FREET4, T3FREE, THYROIDAB,  in the last 72 hours Anemia Panel: No results found for this basename: VITAMINB12, FOLATE, FERRITIN, TIBC, IRON, RETICCTPCT,  in the last 72 hours Coagulation:  Recent Labs  08/09/13 0900  LABPROT  13.2  INR 1.00   Urine Drug Screen: Drugs of Abuse     Component Value Date/Time   LABOPIA NONE DETECTED 08/09/2013 0916   COCAINSCRNUR NONE DETECTED 08/09/2013 0916   LABBENZ NONE DETECTED 08/09/2013 0916   AMPHETMU NONE DETECTED 08/09/2013 0916   THCU NONE DETECTED 08/09/2013 0916   LABBARB NONE DETECTED 08/09/2013 0916    Alcohol Level:  Recent Labs  08/09/13 0900  ETH <11   Urinalysis:  Recent Labs  08/09/13 0916  COLORURINE YELLOW  LABSPEC 1.014  PHURINE 7.0  GLUCOSEU NEGATIVE  HGBUR NEGATIVE  BILIRUBINUR NEGATIVE  KETONESUR NEGATIVE  PROTEINUR NEGATIVE  UROBILINOGEN 0.2  NITRITE NEGATIVE  LEUKOCYTESUR NEGATIVE    Micro Results: No results found for this or any previous visit (from the past 240  hour(s)). Studies/Results: Dg Chest 2 View  08/09/2013   CLINICAL DATA:  Stroke, chest tightness, RIGHT side weakness, near syncope for 1 day, history GERD, smoking  EXAM: CHEST  2 VIEW  COMPARISON:  03/06/2013  FINDINGS: Normal heart size, mediastinal contours, and pulmonary vascularity.  Lungs clear.  No pneumothorax.  Bones unremarkable.  IMPRESSION: Normal exam.   Electronically Signed   By: Ulyses Southward M.D.   On: 08/09/2013 16:01   Ct Head Wo Contrast  08/09/2013   CLINICAL DATA:  Slurred speech, weakness.  EXAM: CT HEAD WITHOUT CONTRAST  TECHNIQUE: Contiguous axial images were obtained from the base of the skull through the vertex without intravenous contrast.  COMPARISON:  CT scan of September 06, 2011.  FINDINGS: Bony calvarium appears intact. No mass effect or midline shift is noted. Ventricular size is within normal limits. There is no evidence of mass lesion, hemorrhage or acute infarction.  IMPRESSION: Normal head CT.   Electronically Signed   By: Roque Lias M.D.   On: 08/09/2013 09:25   Mr [name] Head Wo Contrast  08/09/2013   CLINICAL DATA:  History of polysubstance abuse including cocaine and THC use, positive on urine drug screen 2 weeks ago. Acute onset of unsteadiness, with slurred speech and restricted upward gaze.  EXAM: MRI HEAD WITHOUT CONTRAST  MRA HEAD WITHOUT CONTRAST  TECHNIQUE: Multiplanar, multiecho pulse sequences of the brain and surrounding structures were obtained without intravenous contrast. Angiographic images of the head were obtained using MRA technique without contrast.  COMPARISON:  CT head 08/09/2013.  FINDINGS: MRI HEAD FINDINGS  No evidence for acute infarction, hemorrhage, mass lesion, hydrocephalus, or extra-axial fluid. There is no atrophy or white matter disease. Flow voids are maintained. No chronic hemorrhage. No midline abnormality. Mild chronic sinus disease without mastoid fluid or orbital findings. No osseous lesions.  MRA HEAD FINDINGS  The internal carotid  arteries are widely patent. There is moderate disease of the A1 ACA on the RIGHT. LEFT A1 ACA unremarkable. Normal-appearing proximal and distal MCA branches.  There is severe vasoconstriction or arteritis of the posterior circulation. The LEFT vertebral is dominant. There is a long segment of basilar narrowing nearly 1.5 cm length, out of proportion to the degree of basilar hypoplasia which might be expected from BILATERAL fetal PCA carotid origins. There is a severe proximal focal stenosis estimated 75-90% of the basilar just above the anterior inferior cerebellar artery origins. Drug induced arteritis or reversible cerebral vasoconstrictive syndrome (RCVS) are the most likely considerations. No visible subarachnoid hemorrhage on CT or MR to suggest vasospasm as result of blood. RIGHT vertebral small but apparently ending in PICA. No demonstrable flow in either superior cerebellar artery, anterior inferior cerebellar  artery, or PICA.  IMPRESSION: No visible acute stroke or intracranial parenchymal/subarachnoid hemorrhage.  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 Concern raised for drug-induced arteritis or reversible cerebral vasoconstrictive syndrome.   Electronically Signed   By: Davonna BellingJohn  Curnes M.D.   On: 08/09/2013 13:44   Mr Brain Wo Contrast  08/09/2013   CLINICAL DATA:  History of polysubstance abuse including cocaine and THC use, positive on urine drug screen 2 weeks ago. Acute onset of unsteadiness, with slurred speech and restricted upward gaze.  EXAM: MRI HEAD WITHOUT CONTRAST  MRA HEAD WITHOUT CONTRAST  TECHNIQUE: Multiplanar, multiecho pulse sequences of the brain and surrounding structures were obtained without intravenous contrast. Angiographic images of the head were obtained using MRA technique without contrast.  COMPARISON:  CT head 08/09/2013.  FINDINGS: MRI HEAD FINDINGS  No evidence for acute infarction, hemorrhage, mass lesion, hydrocephalus, or  extra-axial fluid. There is no atrophy or white matter disease. Flow voids are maintained. No chronic hemorrhage. No midline abnormality. Mild chronic sinus disease without mastoid fluid or orbital findings. No osseous lesions.  MRA HEAD FINDINGS  The internal carotid arteries are widely patent. There is moderate disease of the A1 ACA on the RIGHT. LEFT A1 ACA unremarkable. Normal-appearing proximal and distal MCA branches.  There is severe vasoconstriction or arteritis of the posterior circulation. The LEFT vertebral is dominant. There is a long segment of basilar narrowing nearly 1.5 cm length, out of proportion to the degree of basilar hypoplasia which might be expected from BILATERAL fetal PCA carotid origins. There is a severe proximal focal stenosis estimated 75-90% of the basilar just above the anterior inferior cerebellar artery origins. Drug induced arteritis or reversible cerebral vasoconstrictive syndrome (RCVS) are the most likely considerations. No visible subarachnoid hemorrhage on CT or MR to suggest vasospasm as result of blood. RIGHT vertebral small but apparently ending in PICA. No demonstrable flow in either superior cerebellar artery, anterior inferior cerebellar artery, or PICA.  IMPRESSION: No visible acute stroke or intracranial parenchymal/subarachnoid hemorrhage.  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 Concern raised for drug-induced arteritis or reversible cerebral vasoconstrictive syndrome.   Electronically Signed   By: Davonna BellingJohn  Curnes M.D.   On: 08/09/2013 13:44   Medications: I have reviewed the patient's current medications. Scheduled Meds: .  stroke: mapping our early stages of recovery book   Does not apply Once  . aspirin EC  81 mg Oral Daily  . FLUoxetine  20 mg Oral Daily  . heparin  5,000 Units Subcutaneous 3 times per day  . niMODipine  30 mg Oral 6 times per day  . pantoprazole  40 mg Oral BID  . pneumococcal 23 valent vaccine   0.5 mL Intramuscular Tomorrow-1000  . risperiDONE  4 mg Oral QHS   Continuous Infusions:  PRN Meds:.senna-docusate  Assessment/Plan:Lonnie [name] is a 48 yo with PMH of polysubstance abuse, depression, schizoaffective disorder who presented to the hospital after having stroke like symptoms and has currently returned to baseline function.    Active Problems:   Schizoaffective disorder   Slurred speech   TIA (transient ischemic attack)  # TIA vs stroke vs posterior circulation insufficiency: Pt appeared to have new onset of dysarthria and dysphagia during meeting yesterday. Code stroke was called and neuro evaluated where by patient's symptoms had resolved. Ct head negative. MRI showed some diffuse narrowing of the basilar artery which is concerning for drug induce arteritis vs  reversible cerebral vasoconstriction syndrome. Pt does have some new EKG changes indicating possible etiology with arrythmia/presyncope as well also in the setting of previous polysubstance abuse. Troponin x3 are negative. PT/OT evaluated pt and found no residual deficits. Hgb A1C was 5.9. - Risk stratification labs: HgbA1c, FLP  - Echo, carotid dopplers, trops x3 pending  - ASA - Continuous tele  - Repeat ECG today - NPO for IR angio to evaluate posterior circulation - Add atorvastatin for secondary stroke prevention  - Continue nimodipine RCVS  #Schizoaffective disorder/depression - Sees psychiatrist at [facility]. Currently taking risperidone 4mg  daily and fluoxetine 20mg  daily. Pt has been hearing voices recently but says they are benign at the moment and he doesn't have thoughts of hurting himself or others.  -Continue home risperidone and fluoxetine   #Substance abuse- Drug screen negative on admission and positive two weeks ago which is consistent with patient's report. History of cocaine, alcohol, tobacco, marijuana use. Currently staying at the [facility] center and per patient completely drug free.   Dispo:  Disposition is deferred at this time, awaiting improvement of current medical problems.  Anticipated discharge in approximately 1-2 day(s).   The patient does not have a current PCP (No Pcp Per Patient) and does need an [facility] hospital follow-up appointment after discharge.  The patient does not know have transportation limitations that hinder transportation to clinic appointments.  .Services Needed at time of discharge: Y = Yes, Blank = No PT:   OT:   RN:   Equipment:   Other:     LOS: 1 day   Annia Belt, Med Student 08/10/2013, 10:14 AM

## 2013-08-10 NOTE — Progress Notes (Signed)
*  PRELIMINARY RESULTS* Vascular Ultrasound Carotid Duplex (Doppler) has been completed.  Preliminary findings: Bilateral:  1-39% ICA stenosis.  Vertebral artery flow is antegrade.      Lulu Hirschmann FRANCES 08/10/2013, 10:48 AM

## 2013-08-10 NOTE — Evaluation (Signed)
Physical Therapy Evaluation Patient Details Name: Lonnie [name] MRN: [id number] DOB: December 05, 1965 Today's Date: 08/10/2013   History of Present Illness  Patient is a 48 y/o male admitted on 7/16 from [facility] as code stroke due to right facial numbness, imbalance and dysarthria. CT head (-). NIHSS 1 (sensory to face). MRI - no stoke but showed focal diffuse narrowing of basilar artery concerning for drug-induced arteritis or reversible cerebral vasoconstrictive syndrome. PMH of polysubstance abuse on detox at [facility] drug and rehab (been clean for 23 days), depression, schizophrenia and GERD. All symptoms resolved at time of PT evaluation.   Clinical Impression  Patient has returned to functional baseline and able to tolerate ambulation with changes in direction, stops and head turns as well as changes in velocity without difficulty or LOB. Pt reports "working out" prior to PT arrival - performing push ups in room and ambulating in halls. Pt does not require skilled therapy services as all symptoms have resolved and pt is functioning at baseline. Discharge from therapy.    Follow Up Recommendations No PT follow up    Equipment Recommendations  None recommended by PT    Recommendations for Other Services       Precautions / Restrictions Precautions Precautions: None Restrictions Weight Bearing Restrictions: No      Mobility  Bed Mobility               General bed mobility comments: Pt sitting EOB upon arrival.  Transfers Overall transfer level: Independent Equipment used: None                Ambulation/Gait Ambulation/Gait assistance: Independent Ambulation Distance (Feet): 300 Feet Assistive device: None Gait Pattern/deviations: Step-through pattern   Gait velocity interpretation: at or above normal speed for age/gender General Gait Details: No deficits noted even with higher level balacne activities/changes in gait speed.  Stairs            Wheelchair  Mobility    Modified Rankin (Stroke Patients Only)       Balance Overall balance assessment: Independent                                           Pertinent Vitals/Pain No pain reported.     Home Living Family/patient expects to be discharged to:: Other (Comment) ([facility]) Living Arrangements: Alone                    Prior Function Level of Independence: Independent               Hand Dominance        Extremity/Trunk Assessment   Upper Extremity Assessment: Overall WFL for tasks assessed           Lower Extremity Assessment: Overall WFL for tasks assessed         Communication   Communication: No difficulties  Cognition Arousal/Alertness: Awake/alert Behavior During Therapy: WFL for tasks assessed/performed Overall Cognitive Status: Within Functional Limits for tasks assessed                      General Comments      Exercises        Assessment/Plan    PT Assessment Patent does not need any further PT services  PT Diagnosis     PT Problem List    PT Treatment Interventions  PT Goals (Current goals can be found in the Care Plan section) Acute Rehab PT Goals PT Goal Formulation: No goals set, d/c therapy    Frequency     Barriers to discharge        Co-evaluation               End of Session   Activity Tolerance: Patient tolerated treatment well Patient left:  (Walking around hallways.) Nurse Communication: Mobility status         Time: 0820-0827 PT Time Calculation (min): 7 min   Charges:   PT Evaluation $Initial PT Evaluation Tier I: 1 Procedure     PT G CodesAlvie Heidelberg:          Folan, Marelin Tat A 08/10/2013, 8:49 AM Alvie HeidelbergShauna Folan, PT, DPT [phone]

## 2013-08-10 NOTE — Progress Notes (Signed)
Patient ID: Lonnie [name], male   DOB: 01/30/1965, 48 y.o.   MRN: [id number]   Request has been made for cerebral arteriogram  Have come to pts room to evaluate and discuss risks and benefits of procedure for consent. He is refusing cerebral arteriogram  Tried to discuss further but pt refuses

## 2013-08-10 NOTE — ED Provider Notes (Signed)
Medical screening examination/treatment/procedure(s) were conducted as a shared visit with non-physician practitioner(s) or resident and myself. I personally evaluated the patient during the encounter and agree with the findings.  I have personally reviewed any xrays and/ or EKG's with the provider and I agree with interpretation.  Patient with cocaine use, has not used in 23 days, [facility], left facial numbness and mild slurred speech that started at 745 today. Patient says he woke up normal. No history of brain bleeds or stroke known. Patient feels generally weak and just does not feel normal. On exam patient has very subtle slurred speech, alert and oriented, equal 5+ bilateral upper and 4+ bilateral lower extremities with no arm or leg drift, patient had overall normal gait with mild cautious walking, gross sensation intact bilateral upper lower extremities, extraocular muscle function intact except decreased vertical bilateral, no facial droop, sensation intact bilateral cheeks, neck supple no meningismus, abdomen soft nontender, mild dry mucous membranes. Code stroke called and discuss case with neurologist on call for emergent [facility] for further evaluation after CT scan performed. Patient protecting his airway at this time. [facility].         Enid SkeensJoshua M Verda Mehta, MD 08/10/13 [phone]

## 2013-08-11 ENCOUNTER — Emergency Department (HOSPITAL_BASED_OUTPATIENT_CLINIC_OR_DEPARTMENT_OTHER)
Admission: EM | Admit: 2013-08-11 | Discharge: 2013-08-11 | Disposition: A | Discharge status: Home / Self Care | Payer: MEDICAID | Attending: Emergency Medicine | Admitting: Emergency Medicine

## 2013-08-11 ENCOUNTER — Encounter (HOSPITAL_BASED_OUTPATIENT_CLINIC_OR_DEPARTMENT_OTHER): Payer: Self-pay | Admitting: Emergency Medicine

## 2013-08-11 DIAGNOSIS — I10 Essential (primary) hypertension: Secondary | ICD-10-CM | POA: Insufficient documentation

## 2013-08-11 DIAGNOSIS — Z79899 Other long term (current) drug therapy: Secondary | ICD-10-CM | POA: Insufficient documentation

## 2013-08-11 DIAGNOSIS — Z7982 Long term (current) use of aspirin: Secondary | ICD-10-CM | POA: Insufficient documentation

## 2013-08-11 DIAGNOSIS — R030 Elevated blood-pressure reading, without diagnosis of hypertension: Secondary | ICD-10-CM

## 2013-08-11 DIAGNOSIS — F3289 Other specified depressive episodes: Secondary | ICD-10-CM | POA: Insufficient documentation

## 2013-08-11 DIAGNOSIS — F17200 Nicotine dependence, unspecified, uncomplicated: Secondary | ICD-10-CM | POA: Insufficient documentation

## 2013-08-11 DIAGNOSIS — M545 Low back pain, unspecified: Secondary | ICD-10-CM | POA: Insufficient documentation

## 2013-08-11 DIAGNOSIS — Z8719 Personal history of other diseases of the digestive system: Secondary | ICD-10-CM | POA: Insufficient documentation

## 2013-08-11 DIAGNOSIS — F209 Schizophrenia, unspecified: Secondary | ICD-10-CM | POA: Insufficient documentation

## 2013-08-11 DIAGNOSIS — F329 Major depressive disorder, single episode, unspecified: Secondary | ICD-10-CM | POA: Insufficient documentation

## 2013-08-11 HISTORY — DX: Essential (primary) hypertension: I10

## 2013-08-11 NOTE — Discharge Instructions (Signed)
Your blood pressure in the emergency department was normal. Given your not having any symptoms and had normal blood pressure, I do not feel he would need to be started on medications at this time. Please followup with your primary care physician as needed. If you develop chest pain, shortness of breath, numbness or weakness on one side of your body, facial droop, changes in your speech or vision, please return to the emergency department.   How to Take Your Blood Pressure  These instructions are only for electronic home blood pressure machines. You will need:   An automatic or semi-automatic blood pressure machine.  Fresh batteries for the blood pressure machine. HOW DO I USE THESE TOOLS TO CHECK MY BLOOD PRESSURE?   There are 2 numbers that make up your blood pressure. For example: 120/80.  The first number (120 in our example) is called the "systolic pressure." It is a measure of the pressure in your blood vessels when your heart is pumping blood.  The second number (80 in our example) is called the "diastolic pressure." It is a measure of the pressure in your blood vessels when your heart is resting between beats.  Before you buy a home blood pressure machine, check the size of your arm so you can buy the right size cuff. Here is how to check the size of your arm:  Use a tape measure that shows both inches and centimeters.  Wrap the tape measure around the middle upper part of your arm. You may need someone to help you measure right.  Write down your arm measurement in both inches and centimeters.  To measure your blood pressure right, it is important to have the right size cuff.  If your arm is up to 13 inches (37 to 34 centimeters), get an adult cuff size.  If your arm is 13 to 17 inches (35 to 44 centimeters), get a large adult cuff size.  If your arm is 17 to 20 inches (45 to 52 centimeters), get an adult thigh cuff.  Try to rest or relax for at least 30 minutes before you  check your blood pressure.  Do not smoke.  Do not have any drinks with caffeine, such as:  Pop.  Coffee.  Tea.  Check your blood pressure in a quiet room.  Sit down and stretch out your arm on a table. Keep your arm at about the level of your heart. Let your arm relax. GETTING BLOOD PRESSURE READINGS  Make sure you remove any tight-fighting clothing from your arm. Wrap the cuff around your upper arm. Wrap it just above the bend, and above where you felt the pulse. You should be able to slip a finger between the cuff and your arm. If you cannot slip a finger in the cuff, it is too tight and should be removed and rewrapped.  Some units requires you to manually pump up the arm cuff.  Automatic units inflate the cuff when you press a button.  Cuff deflation is automatic in both models.  After the cuff is inflated, the unit measures your blood pressure and pulse. The readings are displayed on a monitor. Hold still and breathe normally while the cuff is inflated.  Getting a reading takes less than a minute.  Some models store readings in a memory. Some provide a printout of readings.  Get readings at different times of the day. You should wait at least 5 minutes between readings. Take readings with you to your next doctor's  visit. Document Released: 12/25/2007 Document Revised: 04/05/2011 Document Reviewed: 12/25/2007 [facility] Patient Information 2015 [place], [place]. This information is not intended to replace advice given to you by your health care provider. Make sure you discuss any questions you have with your health care provider.  Hypertension Hypertension is another name for high blood pressure. High blood pressure forces your heart to work harder to pump blood. A blood pressure reading has two numbers, which includes a higher number over a lower number (example: 110/72). HOME CARE   Have your blood pressure rechecked by your doctor.  Only take medicine as told by your  doctor. Follow the directions carefully. The medicine does not work as well if you skip doses. Skipping doses also puts you at risk for problems.  Do not smoke.  Monitor your blood pressure at home as told by your doctor. GET HELP IF:  You think you are having a reaction to the medicine you are taking.  You have repeat headaches or feel dizzy.  You have puffiness (swelling) in your ankles.  You have trouble with your vision. GET HELP RIGHT AWAY IF:   You get a very bad headache and are confused.  You feel weak, numb, or faint.  You get chest or belly (abdominal) pain.  You throw up (vomit).  You cannot breathe very well. MAKE SURE YOU:   Understand these instructions.  Will watch your condition.  Will get help right away if you are not doing well or get worse. Document Released: 06/30/2007 Document Revised: 01/16/2013 Document Reviewed: 11/03/2012 [facility] Patient Information 2015 [place], [place]. This information is not intended to replace advice given to you by your health care provider. Make sure you discuss any questions you have with your health care provider.

## 2013-08-11 NOTE — Progress Notes (Signed)
Utilization Review Completed.Lonnie [name] T7/18/2015  

## 2013-08-11 NOTE — ED Notes (Signed)
[facility].  Pt has been there since 7/7 and today he was noted to have a BP of 152/105 and was driven here for evaluation.  Pt is in no distress, states that he has hx of htn and isnt on any bp meds for this.

## 2013-08-11 NOTE — ED Provider Notes (Signed)
TIME SEEN: 5:10 PM  CHIEF COMPLAINT: HTN  HPI Comments: Lonnie [name] is a 48 y.o. Male with a history of prior poor did history of CVA, hypertension no longer on medication, substance abuse, schizophrenia who presents to the Emergency Department complaining of high BP onset PTA. He reports that he is currently at a drug treatment facility where they were concerned about his BP, which was 152/105. He denies headache, chest pain, fever, nausea, or vomiting, numbness, tingling or focal weakness.  He states he is feeling fine today. His blood pressure in the emergency department is 140/80s.  ROS: See HPI Constitutional: no fever  Eyes: no drainage  ENT: no runny nose   Cardiovascular:  no chest pain  Resp: no SOB  GI: no vomiting GU: no dysuria Integumentary: no rash  Allergy: no hives  Musculoskeletal: no leg swelling  Neurological: no slurred speech ROS otherwise negative  PAST MEDICAL HISTORY/PAST SURGICAL HISTORY:  Past Medical History  Diagnosis Date  . GERD (gastroesophageal reflux disease)   . Depression   . Lumbago   . ETOH abuse   . Cocaine abuse   . Xanax use disorder, mild, abuse   . Marijuana abuse   . Schizophrenia   . Hypertension     pt reports that he has hx    MEDICATIONS:  Prior to Admission medications   Medication Sig Start Date End Date Taking? Authorizing Provider  aspirin 81 MG EC tablet Take 1 tablet (81 mg total) by mouth daily. 08/10/13   Christen Bame, MD  atorvastatin (LIPITOR) 40 MG tablet Take 1 tablet (40 mg total) by mouth daily. 08/10/13   Christen Bame, MD  cyclobenzaprine (FLEXERIL) 10 MG tablet Take 1 tablet (10 mg total) by mouth 3 (three) times daily. 07/29/13   Beau Fanny, FNP  FLUoxetine (PROZAC) 20 MG capsule Take 1 capsule (20 mg total) by mouth daily. 07/29/13   Beau Fanny, FNP  prazosin (MINIPRESS) 1 MG capsule Take 1 capsule (1 mg total) by mouth at bedtime. 07/29/13 07/29/14  Beau Fanny, FNP  risperidone (RISPERDAL) 4 MG tablet Take  4 mg by mouth at bedtime.     Historical Provider, MD    ALLERGIES:  Allergies  Allergen Reactions  . Ibuprofen Itching    SOCIAL HISTORY:  History  Substance Use Topics  . Smoking status: Current Every Day Smoker -- 0.50 packs/day for 15 years    Types: Cigarettes    Last Attempt to Quit: 08/08/2012  . Smokeless tobacco: Never Used  . Alcohol Use: Yes     Comment: 12 pk daily    FAMILY HISTORY: Family History  Problem Relation Age of Onset  . Diabetes Mother   . Hypertension Mother   . Hyperlipidemia Father   . Heart attack Neg Hx   . Sudden death Neg Hx     EXAM: BP 143/85  Pulse 108  Temp(Src) 98.8 F (37.1 C) (Oral)  Resp 16  Ht 5\' 11"  (1.803 m)  Wt 203 lb 4.8 oz (92.216 kg)  BMI 28.37 kg/m2  SpO2 100% CONSTITUTIONAL: Alert and oriented and responds appropriately to questions. Well-appearing; well-nourished HEAD: Normocephalic EYES: Conjunctivae clear, PERRL ENT: normal nose; no rhinorrhea; moist mucous membranes; pharynx without lesions noted NECK: Supple, no meningismus, no LAD  CARD: RRR; S1 and S2 appreciated; no murmurs, no clicks, no rubs, no gallops RESP: Normal chest excursion without splinting or tachypnea; breath sounds clear and equal bilaterally; no wheezes, no rhonchi, no rales,  ABD/GI:  Normal bowel sounds; non-distended; soft, non-tender, no rebound, no guarding BACK:  The back appears normal and is non-tender to palpation, there is no CVA tenderness EXT: Normal ROM in all joints; non-tender to palpation; no edema; normal capillary refill; no cyanosis    SKIN: Normal color for age and race; warm NEURO: Moves all extremities equally. Strength 5/5 in all 4 extremities. Cranial nerves 2-12 intact. Sensation to light, touch intact diffusely. Normal gait, no dysmetria to finger-nose testing, normal rapid alternating movements, normal heel-to-shin testing bilaterally PSYCH: The patient's mood and manner are appropriate. Grooming and personal hygiene  are appropriate.  MEDICAL DECISION MAKING: Patient here with transient hypertension is now resolved. He is completely asymptomatic. I do not feel he needs to be started on antihypertensives at this time. I feel he is safe to be discharged back to his drug treatment facility. Have discussed with patient strict return precautions and supportive care instructions. He verbalizes understanding is comfortable with plan.       Layla MawKristen N Shahzain Kiester, DO 08/11/13 [phone]

## 2013-08-30 NOTE — Discharge Summary (Signed)
Physician Discharge Summary Note  Patient:  Lonnie [name] is an 48 y.o., male MRN:  [id number] DOB:  02-24-1965 Patient phone:  [phone] (home)  Patient address:   [address] [place] [address],  Total Time spent with patient: 30 minutes  Date of Admission:  07/24/2013 Date of Discharge: 07/30/2013  Reason for Admission:  MDD  Discharge Diagnoses: Principal Problem:   GI bleed Active Problems:   Chronic low back pain   Alcohol dependence with alcohol-induced mood disorder   Major depression, recurrent   Cocaine dependence   Schizoaffective disorder   Hematemesis   Psychiatric Specialty Exam: Physical Exam  ROS  Blood pressure 116/76, pulse 91, temperature 98.5 F (36.9 C), temperature source Oral, resp. rate 24, height 5\' 10"  (1.778 m), weight 83.915 kg (185 lb), SpO2 100.00%.Body mass index is 26.54 kg/(m^2).   General Appearance: Disheveled   Eye Solicitor:: Fair   Speech: Clear and Coherent, Slow and not spontaneous   Volume: Decreased   Mood: Anxious and Depressed   Affect: Restricted   Thought Process: Coherent and Goal Directed   Orientation: Other: person, place   Thought Content: events symptoms worries concerns   Suicidal Thoughts: intermittent   Homicidal Thoughts: No   Memory: Immediate; Fair  Recent; Fair  Remote; Fair   Judgement: Fair   Insight: Present and Shallow   Psychomotor Activity: Restlessness   Concentration: Fair   Recall: [employer] of Knowledge:NA   Language: Fair   Akathisia: No   Handed:   AIMS (if indicated):   Assets: Desire for Improvement   Sleep: Number of Hours: 4.5    Musculoskeletal:  Strength & Muscle Tone: within normal limits  Gait & Station: normal  Patient leans: N/A   DSM5:  Trauma-Stressor Disorders: none  Substance/Addictive Disorders: 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  GERD (gastroesophageal reflux disease)    .  Depression     AXIS IV: other psychosocial or environmental problems  AXIS V: 41-50 serious symptoms   Level of Care:  OP  Hospital Course:  20 Y/Omale who states he has been increasingly more depressed. He is having financial difficulties. 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 He states he was not able to get [employer] and he is trying to get disability. States he has also pain in his foot that affects his ability to stand for long periods of time. He states he was staying with his grandparents. He got upset when he found the mother of his kid sleeping with another guy. He got more depressed started drinking more. He drinks a 12 pack uses crack $100 worth a day. He smokes pot and used Xanax when he can find it. He hustles to get the money for the drugs and alcohol. States he has had thoughts of wanting to hurt himself. States he realizes he cant continue to live like this.  During Hospitalization: Medications managed, psychoeducation, group and individual therapy. Pt currently denies SI, HI, and Psychosis. At discharge, pt rates anxiety and depression as minimal. Pt states that he does have a good supportive home environment and will followup with outpatient treatment. Affirms agreement with medication regimen and discharge plan. Denies other physical and psychological concerns  at time of discharge.    Consults:  None  Significant Diagnostic Studies:  None  Discharge Vitals:   Blood pressure 116/76, pulse 91, temperature 98.5 F (36.9 C), temperature source Oral, resp. rate 24, height 5\' 10"  (1.778 m), weight 83.915 kg (185 lb), SpO2 100.00%. Body mass index is 26.54 kg/(m^2). Lab Results:   No results found for this or any previous visit (from the past 72 hour(s)).  Physical Findings: AIMS: Facial and  Oral Movements Muscles of Facial Expression: None, normal Lips and Perioral Area: None, normal Jaw: None, normal Tongue: None, normal,Extremity Movements Upper (arms, wrists, hands, fingers): None, normal Lower (legs, knees, ankles, toes): None, normal, Trunk Movements Neck, shoulders, hips: None, normal, Overall Severity Severity of abnormal movements (highest score from questions above): None, normal Incapacitation due to abnormal movements: None, normal Patient's awareness of abnormal movements (rate only patient's report): No Awareness, Dental Status Current problems with teeth and/or dentures?: No Does patient usually wear dentures?: No  CIWA:  CIWA-Ar Total: 0 COWS:     Psychiatric Specialty Exam: See Psychiatric Specialty Exam and Suicide Risk Assessment completed by Attending Physician prior to discharge.  Discharge destination:  Home  Is patient on multiple antipsychotic therapies at discharge:  No   Has Patient had three or more failed trials of antipsychotic monotherapy by history:  No  Recommended Plan for Multiple Antipsychotic Therapies: NA     Medication List    STOP taking these medications       risperidone 4 MG tablet  Commonly known as:  RISPERDAL      TAKE these medications     Indication   cyclobenzaprine 10 MG tablet  Commonly known as:  FLEXERIL  Take 1 tablet (10 mg total) by mouth 3 (three) times daily.   Indication:  Muscle Spasm     FLUoxetine 20 MG capsule  Commonly known as:  PROZAC  Take 1 capsule (20 mg total) by mouth daily.   Indication:  mood stabilization     prazosin 1 MG capsule  Commonly known as:  MINIPRESS  Take 1 capsule (1 mg total) by mouth at bedtime.   Indication:  PTSD           Follow-up Information   [facility] On 07/30/2013. (Arrive by 8am for screening and admission. Make sure to bring photo ID, medications, and clothing. )    Contact information:   [address] [place],  [place] [address] Phone: [phone] Fax: [phone]      Follow up with [facility]. (Walk in between 8am-9am Monday through Friday for hospital follow-up/medication management/assessment for therapy services. )    Contact information:   [address] [place], [place] [address] Phone: [phone] Fax: [phone]      Follow-up recommendations:  Activity:  As tolerated Diet:  Heart healthy with low sodium.  Comments:  Take all medications as prescribed. Keep all follow-up appointments as scheduled.  Do not consume alcohol or use illegal drugs while on prescription medications. Report any adverse effects from your medications to your primary care provider promptly.  In the event of recurrent symptoms or worsening symptoms, call 911, a crisis hotline, or go to the nearest emergency department for evaluation.   Total Discharge Time:  Greater than 30 minutes.  Signed: Beau FannyWithrow, John C, FNP-BC 07/30/2013, 11:37 AM I personally assessed the patient and formulated the plan Madie RenoIrving A. Dub MikesLugo, M.D.

## 2013-11-14 ENCOUNTER — Emergency Department (HOSPITAL_BASED_OUTPATIENT_CLINIC_OR_DEPARTMENT_OTHER): Payer: Self-pay

## 2013-11-14 ENCOUNTER — Encounter (HOSPITAL_BASED_OUTPATIENT_CLINIC_OR_DEPARTMENT_OTHER): Payer: Self-pay | Admitting: Emergency Medicine

## 2013-11-14 ENCOUNTER — Emergency Department (HOSPITAL_BASED_OUTPATIENT_CLINIC_OR_DEPARTMENT_OTHER)
Admission: EM | Admit: 2013-11-14 | Discharge: 2013-11-14 | Disposition: A | Discharge status: Home / Self Care | Payer: Self-pay | Attending: Emergency Medicine | Admitting: Emergency Medicine

## 2013-11-14 DIAGNOSIS — F329 Major depressive disorder, single episode, unspecified: Secondary | ICD-10-CM | POA: Insufficient documentation

## 2013-11-14 DIAGNOSIS — Z9889 Other specified postprocedural states: Secondary | ICD-10-CM | POA: Insufficient documentation

## 2013-11-14 DIAGNOSIS — I10 Essential (primary) hypertension: Secondary | ICD-10-CM | POA: Insufficient documentation

## 2013-11-14 DIAGNOSIS — Z79899 Other long term (current) drug therapy: Secondary | ICD-10-CM | POA: Insufficient documentation

## 2013-11-14 DIAGNOSIS — Z720 Tobacco use: Secondary | ICD-10-CM | POA: Insufficient documentation

## 2013-11-14 DIAGNOSIS — F209 Schizophrenia, unspecified: Secondary | ICD-10-CM | POA: Insufficient documentation

## 2013-11-14 DIAGNOSIS — G8929 Other chronic pain: Secondary | ICD-10-CM | POA: Insufficient documentation

## 2013-11-14 DIAGNOSIS — Z7982 Long term (current) use of aspirin: Secondary | ICD-10-CM | POA: Insufficient documentation

## 2013-11-14 DIAGNOSIS — M545 Low back pain: Secondary | ICD-10-CM | POA: Insufficient documentation

## 2013-11-14 DIAGNOSIS — Z8719 Personal history of other diseases of the digestive system: Secondary | ICD-10-CM | POA: Insufficient documentation

## 2013-11-14 MED ORDER — NAPROXEN 250 MG PO TABS
500.0000 mg | ORAL_TABLET | Freq: Once | ORAL | Status: AC
  Administered 2013-11-14: 500 mg via ORAL
  Filled 2013-11-14: qty 2

## 2013-11-14 MED ORDER — MELOXICAM 15 MG PO TABS
15.0000 mg | ORAL_TABLET | Freq: Every day | ORAL | Status: DC
  Filled 2013-11-14: qty 1

## 2013-11-14 MED ORDER — MELOXICAM 15 MG PO TABS: 15.0000 mg | ORAL_TABLET | Freq: Every day | ORAL | Status: DC

## 2013-11-14 MED ORDER — FAMOTIDINE 20 MG PO TABS
20.0000 mg | ORAL_TABLET | Freq: Once | ORAL | Status: AC
  Administered 2013-11-14: 20 mg via ORAL
  Filled 2013-11-14: qty 1

## 2013-11-14 MED ORDER — PREDNISONE 20 MG PO TABS: ORAL_TABLET | ORAL | Status: DC

## 2013-11-14 MED ORDER — METHOCARBAMOL 500 MG PO TABS
1000.0000 mg | ORAL_TABLET | Freq: Once | ORAL | Status: AC
  Administered 2013-11-14: 1000 mg via ORAL
  Filled 2013-11-14: qty 2

## 2013-11-14 MED ORDER — DEXAMETHASONE SODIUM PHOSPHATE 10 MG/ML IJ SOLN
10.0000 mg | Freq: Once | INTRAMUSCULAR | Status: AC
  Administered 2013-11-14: 10 mg via INTRAMUSCULAR
  Filled 2013-11-14: qty 1

## 2013-11-14 MED ORDER — METHOCARBAMOL 500 MG PO TABS
500.0000 mg | ORAL_TABLET | Freq: Two times a day (BID) | ORAL | Status: DC
Start: 2013-11-14 — End: 2013-11-20

## 2013-11-14 NOTE — Discharge Instructions (Signed)
Back Exercises These exercises may help you when beginning to rehabilitate your injury. Your symptoms may resolve with or without further involvement from your physician, physical therapist or athletic trainer. While completing these exercises, remember:   Restoring tissue flexibility helps normal motion to return to the joints. This allows healthier, less painful movement and activity.  An effective stretch should be held for at least 30 seconds.  A stretch should never be painful. You should only feel a gentle lengthening or release in the stretched tissue. STRETCH - Extension, Prone on Elbows   Lie on your stomach on the floor, a bed will be too soft. Place your palms about shoulder width apart and at the height of your head.  Place your elbows under your shoulders. If this is too painful, stack pillows under your chest.  Allow your body to relax so that your hips drop lower and make contact more completely with the floor.  Hold this position for __________ seconds.  Slowly return to lying flat on the floor. Repeat __________ times. Complete this exercise __________ times per day.  RANGE OF MOTION - Extension, Prone Press Ups   Lie on your stomach on the floor, a bed will be too soft. Place your palms about shoulder width apart and at the height of your head.  Keeping your back as relaxed as possible, slowly straighten your elbows while keeping your hips on the floor. You may adjust the placement of your hands to maximize your comfort. As you gain motion, your hands will come more underneath your shoulders.  Hold this position __________ seconds.  Slowly return to lying flat on the floor. Repeat __________ times. Complete this exercise __________ times per day.  RANGE OF MOTION- Quadruped, Neutral Spine   Assume a hands and knees position on a firm surface. Keep your hands under your shoulders and your knees under your hips. You may place padding under your knees for  comfort.  Drop your head and point your tail bone toward the ground below you. This will round out your low back like an angry cat. Hold this position for __________ seconds.  Slowly lift your head and release your tail bone so that your back sags into a large arch, like an old horse.  Hold this position for __________ seconds.  Repeat this until you feel limber in your low back.  Now, find your "sweet spot." This will be the most comfortable position somewhere between the two previous positions. This is your neutral spine. Once you have found this position, tense your stomach muscles to support your low back.  Hold this position for __________ seconds. Repeat __________ times. Complete this exercise __________ times per day.  STRETCH - Flexion, Single Knee to Chest   Lie on a firm bed or floor with both legs extended in front of you.  Keeping one leg in contact with the floor, bring your opposite knee to your chest. Hold your leg in place by either grabbing behind your thigh or at your knee.  Pull until you feel a gentle stretch in your low back. Hold __________ seconds.  Slowly release your grasp and repeat the exercise with the opposite side. Repeat __________ times. Complete this exercise __________ times per day.  STRETCH - Hamstrings, Standing  Stand or sit and extend your right / left leg, placing your foot on a chair or foot stool  Keeping a slight arch in your low back and your hips straight forward.  Lead with your chest and   lean forward at the waist until you feel a gentle stretch in the back of your right / left knee or thigh. (When done correctly, this exercise requires leaning only a small distance.)  Hold this position for __________ seconds. Repeat __________ times. Complete this stretch __________ times per day. STRENGTHENING - Deep Abdominals, Pelvic Tilt   Lie on a firm bed or floor. Keeping your legs in front of you, bend your knees so they are both pointed  toward the ceiling and your feet are flat on the floor.  Tense your lower abdominal muscles to press your low back into the floor. This motion will rotate your pelvis so that your tail bone is scooping upwards rather than pointing at your feet or into the floor.  With a gentle tension and even breathing, hold this position for __________ seconds. Repeat __________ times. Complete this exercise __________ times per day.  STRENGTHENING - Abdominals, Crunches   Lie on a firm bed or floor. Keeping your legs in front of you, bend your knees so they are both pointed toward the ceiling and your feet are flat on the floor. Cross your arms over your chest.  Slightly tip your chin down without bending your neck.  Tense your abdominals and slowly lift your trunk high enough to just clear your shoulder blades. Lifting higher can put excessive stress on the low back and does not further strengthen your abdominal muscles.  Control your return to the starting position. Repeat __________ times. Complete this exercise __________ times per day.  STRENGTHENING - Quadruped, Opposite UE/LE Lift   Assume a hands and knees position on a firm surface. Keep your hands under your shoulders and your knees under your hips. You may place padding under your knees for comfort.  Find your neutral spine and gently tense your abdominal muscles so that you can maintain this position. Your shoulders and hips should form a rectangle that is parallel with the floor and is not twisted.  Keeping your trunk steady, lift your right hand no higher than your shoulder and then your left leg no higher than your hip. Make sure you are not holding your breath. Hold this position __________ seconds.  Continuing to keep your abdominal muscles tense and your back steady, slowly return to your starting position. Repeat with the opposite arm and leg. Repeat __________ times. Complete this exercise __________ times per day. Document Released:  01/29/2005 Document Revised: 04/05/2011 Document Reviewed: 04/25/2008 ExitCare Patient Information 2015 [employer]. This information is not intended to replace advice given to you by your health care provider. Make sure you discuss any questions you have with your health care provider.  

## 2013-11-14 NOTE — ED Provider Notes (Signed)
CSN: [id number]     Arrival date & time 11/14/13  [id number] History   None    Chief Complaint  Patient presents with  . Back Pain     (Consider location/radiation/quality/duration/timing/severity/associated sxs/prior Treatment) Patient is a 48 y.o. male presenting with back pain. The history is provided by the patient.  Back Pain Location:  Sacro-iliac joint Quality:  Stabbing Radiates to: right buttock. Pain severity:  Severe Pain is:  Same all the time Onset quality:  Sudden Timing:  Constant Progression:  Unchanged Chronicity:  Recurrent Context: not MCA   Relieved by:  Nothing Worsened by:  Nothing tried Associated symptoms: no abdominal pain, no abdominal swelling, no bladder incontinence, no bowel incontinence, no chest pain, no dysuria, no fever, no headaches, no leg pain, no numbness, no paresthesias, no pelvic pain, no perianal numbness, no tingling, no weakness and no weight loss   Risk factors: no hx of cancer   No bowel or bladder incontinence.  No trauma.  Has a h/o chronic back pain and was supposed to follow up with Dr. Mikal Planeabell but has not  Past Medical History  Diagnosis Date  . GERD (gastroesophageal reflux disease)   . Depression   . Lumbago   . ETOH abuse   . Cocaine abuse   . Xanax use disorder, mild, abuse   . Marijuana abuse   . Schizophrenia   . Hypertension     pt reports that he has hx   Past Surgical History  Procedure Laterality Date  . Back surgery    . Neck surgery    . Esophagogastroduodenoscopy (egd) with propofol N/A 07/30/2013    Procedure: ESOPHAGOGASTRODUODENOSCOPY (EGD) WITH PROPOFOL;  Surgeon: Barrie FolkJohn C Hayes, MD;  Location: [facility]COPY;  Service: Endoscopy;  Laterality: N/A;   Family History  Problem Relation Age of Onset  . Diabetes Mother   . Hypertension Mother   . Hyperlipidemia Father   . Heart attack Neg Hx   . Sudden death Neg Hx    History  Substance Use Topics  . Smoking status: Current Every Day Smoker -- 0.50 packs/day  for 15 years    Types: Cigarettes    Last Attempt to Quit: 08/08/2012  . Smokeless tobacco: Never Used  . Alcohol Use: Yes     Comment: 12 pk daily    Review of Systems  Constitutional: Negative for fever and weight loss.  Cardiovascular: Negative for chest pain.  Gastrointestinal: Negative for abdominal pain and bowel incontinence.  Genitourinary: Negative for bladder incontinence, dysuria, difficulty urinating and pelvic pain.  Musculoskeletal: Positive for back pain.  Neurological: Negative for tingling, weakness, numbness, headaches and paresthesias.  All other systems reviewed and are negative.     Allergies  Ibuprofen  Home Medications   Prior to Admission medications   Medication Sig Start Date End Date Taking? Authorizing Provider  acetaminophen (TYLENOL) 325 MG tablet Take 650 mg by mouth every 6 (six) hours as needed.   Yes Historical Provider, MD  aspirin 81 MG EC tablet Take 1 tablet (81 mg total) by mouth daily. 08/10/13   Christen BameNora Sadek, MD  atorvastatin (LIPITOR) 40 MG tablet Take 1 tablet (40 mg total) by mouth daily. 08/10/13   Christen BameNora Sadek, MD  cyclobenzaprine (FLEXERIL) 10 MG tablet Take 1 tablet (10 mg total) by mouth 3 (three) times daily. 07/29/13   Beau FannyJohn C Withrow, FNP  FLUoxetine (PROZAC) 20 MG capsule Take 1 capsule (20 mg total) by mouth daily. 07/29/13   Beau FannyJohn C Withrow, FNP  prazosin (MINIPRESS) 1 MG capsule Take 1 capsule (1 mg total) by mouth at bedtime. 07/29/13 07/29/14  Beau FannyJohn C Withrow, FNP  risperidone (RISPERDAL) 4 MG tablet Take 4 mg by mouth at bedtime.     Historical Provider, MD   BP 91/65  Pulse 83  Temp(Src) 97.9 F (36.6 C) (Oral)  Resp 20  Ht 5\' 11"  (1.803 m)  Wt 210 lb 3.2 oz (95.346 kg)  BMI 29.33 kg/m2  SpO2 100% Physical Exam  Constitutional: He is oriented to person, place, and time. He appears well-developed and well-nourished. No distress.  HENT:  Head: Normocephalic and atraumatic.  Mouth/Throat: Oropharynx is clear and moist.  Eyes:  Conjunctivae and EOM are normal.  Neck: Normal range of motion. Neck supple.  Cardiovascular: Normal rate, regular rhythm and intact distal pulses.   Pulmonary/Chest: Effort normal and breath sounds normal. He has no wheezes. He has no rales.  Abdominal: Soft. Bowel sounds are normal. There is no tenderness. There is no rebound and no guarding.  Musculoskeletal: Normal range of motion. He exhibits no edema and no tenderness.  Neurological: He is alert and oriented to person, place, and time. He has normal reflexes.  Gait intact.  Intact L5/s1 intact perineal sensation  Skin: Skin is warm and dry.  Psychiatric: He has a normal mood and affect.    ED Course  Procedures (including critical care time) Labs Review Labs Reviewed - No data to display  Imaging Review No results found.   EKG Interpretation None      MDM   Final diagnoses:  None    Has been on Mobic in the past.  Will write for same and steroids and muscle relaxants.  Follow up with Dr. Mikal Planeabell.  Return to the closest Ed for inability to urinate loss of control of the bowels or bladder.  Weakness or numbness.      Jasmine AweApril K Napolean Sia-Rasch, MD 11/14/13 [phone]

## 2013-11-14 NOTE — ED Notes (Signed)
Pt reports that he started having back pain last night and tonight it became unbearable, reports history of bulding disc but no follow up

## 2013-11-14 NOTE — ED Notes (Signed)
Pt discharged to home with friend. NAD.  

## 2013-11-15 ENCOUNTER — Emergency Department (HOSPITAL_BASED_OUTPATIENT_CLINIC_OR_DEPARTMENT_OTHER)
Admission: EM | Admit: 2013-11-15 | Discharge: 2013-11-15 | Disposition: A | Discharge status: Home / Self Care | Payer: Self-pay | Attending: Emergency Medicine | Admitting: Emergency Medicine

## 2013-11-15 ENCOUNTER — Encounter (HOSPITAL_BASED_OUTPATIENT_CLINIC_OR_DEPARTMENT_OTHER): Payer: Self-pay | Admitting: Emergency Medicine

## 2013-11-15 DIAGNOSIS — Z79899 Other long term (current) drug therapy: Secondary | ICD-10-CM | POA: Insufficient documentation

## 2013-11-15 DIAGNOSIS — K529 Noninfective gastroenteritis and colitis, unspecified: Secondary | ICD-10-CM

## 2013-11-15 DIAGNOSIS — Z7952 Long term (current) use of systemic steroids: Secondary | ICD-10-CM | POA: Insufficient documentation

## 2013-11-15 DIAGNOSIS — F209 Schizophrenia, unspecified: Secondary | ICD-10-CM | POA: Insufficient documentation

## 2013-11-15 DIAGNOSIS — F329 Major depressive disorder, single episode, unspecified: Secondary | ICD-10-CM | POA: Insufficient documentation

## 2013-11-15 DIAGNOSIS — I10 Essential (primary) hypertension: Secondary | ICD-10-CM | POA: Insufficient documentation

## 2013-11-15 DIAGNOSIS — K5289 Other specified noninfective gastroenteritis and colitis: Secondary | ICD-10-CM | POA: Insufficient documentation

## 2013-11-15 DIAGNOSIS — Z7982 Long term (current) use of aspirin: Secondary | ICD-10-CM | POA: Insufficient documentation

## 2013-11-15 DIAGNOSIS — Z720 Tobacco use: Secondary | ICD-10-CM | POA: Insufficient documentation

## 2013-11-15 DIAGNOSIS — Z8719 Personal history of other diseases of the digestive system: Secondary | ICD-10-CM | POA: Insufficient documentation

## 2013-11-15 LAB — CBC WITH DIFFERENTIAL/PLATELET
Basophils Absolute: 0 10*3/uL (ref 0.0–0.1)
Basophils Relative: 0 % (ref 0–1)
EOS ABS: 0 10*3/uL (ref 0.0–0.7)
EOS PCT: 0 % (ref 0–5)
HEMATOCRIT: 49 % (ref 39.0–52.0)
Hemoglobin: 16.8 g/dL (ref 13.0–17.0)
LYMPHS ABS: 3.5 10*3/uL (ref 0.7–4.0)
LYMPHS PCT: 27 % (ref 12–46)
MCH: 27.9 pg (ref 26.0–34.0)
MCHC: 34.3 g/dL (ref 30.0–36.0)
MCV: 81.4 fL (ref 78.0–100.0)
MONO ABS: 1.1 10*3/uL — AB (ref 0.1–1.0)
Monocytes Relative: 9 % (ref 3–12)
Neutro Abs: 8.1 10*3/uL — ABNORMAL HIGH (ref 1.7–7.7)
Neutrophils Relative %: 64 % (ref 43–77)
PLATELETS: 381 10*3/uL (ref 150–400)
RBC: 6.02 MIL/uL — AB (ref 4.22–5.81)
RDW: 14.9 % (ref 11.5–15.5)
WBC: 12.7 10*3/uL — ABNORMAL HIGH (ref 4.0–10.5)

## 2013-11-15 LAB — COMPREHENSIVE METABOLIC PANEL
ALT: 28 U/L (ref 0–53)
ANION GAP: 11 (ref 5–15)
AST: 24 U/L (ref 0–37)
Albumin: 4.5 g/dL (ref 3.5–5.2)
Alkaline Phosphatase: 56 U/L (ref 39–117)
BUN: 14 mg/dL (ref 6–23)
CALCIUM: 10.6 mg/dL — AB (ref 8.4–10.5)
CO2: 36 meq/L — AB (ref 19–32)
CREATININE: 1.3 mg/dL (ref 0.50–1.35)
Chloride: 94 mEq/L — ABNORMAL LOW (ref 96–112)
GFR, EST AFRICAN AMERICAN: 74 mL/min — AB (ref >90)
GFR, EST NON AFRICAN AMERICAN: 64 mL/min — AB (ref >90)
GLUCOSE: 120 mg/dL — AB (ref 70–99)
Potassium: 4.8 mEq/L (ref 3.7–5.3)
SODIUM: 141 meq/L (ref 137–147)
TOTAL PROTEIN: 8.8 g/dL — AB (ref 6.0–8.3)
Total Bilirubin: 0.9 mg/dL (ref 0.3–1.2)

## 2013-11-15 LAB — URINALYSIS, ROUTINE W REFLEX MICROSCOPIC
BILIRUBIN URINE: NEGATIVE
GLUCOSE, UA: NEGATIVE mg/dL
HGB URINE DIPSTICK: NEGATIVE
Ketones, ur: NEGATIVE mg/dL
Leukocytes, UA: NEGATIVE
Nitrite: NEGATIVE
PROTEIN: 100 mg/dL — AB
Specific Gravity, Urine: 1.03 (ref 1.005–1.030)
UROBILINOGEN UA: 0.2 mg/dL (ref 0.0–1.0)
pH: 8.5 — ABNORMAL HIGH (ref 5.0–8.0)

## 2013-11-15 LAB — CBG MONITORING, ED: Glucose-Capillary: 100 mg/dL — ABNORMAL HIGH (ref 70–99)

## 2013-11-15 LAB — URINE MICROSCOPIC-ADD ON

## 2013-11-15 LAB — LIPASE, BLOOD: LIPASE: 23 U/L (ref 11–59)

## 2013-11-15 LAB — TROPONIN I

## 2013-11-15 MED ORDER — PROMETHAZINE HCL 25 MG/ML IJ SOLN
12.5000 mg | Freq: Once | INTRAMUSCULAR | Status: AC
  Administered 2013-11-15: 12.5 mg via INTRAVENOUS
  Filled 2013-11-15: qty 1

## 2013-11-15 MED ORDER — ONDANSETRON 8 MG PO TBDP: ORAL_TABLET | ORAL | Status: DC

## 2013-11-15 MED ORDER — PROMETHAZINE HCL 25 MG/ML IJ SOLN
12.5000 mg | Freq: Once | INTRAMUSCULAR | Status: AC
  Administered 2013-11-15: 12.5 mg via INTRAVENOUS

## 2013-11-15 MED ORDER — ONDANSETRON HCL 4 MG/2ML IJ SOLN
4.0000 mg | Freq: Once | INTRAMUSCULAR | Status: AC
  Administered 2013-11-15: 4 mg via INTRAVENOUS
  Filled 2013-11-15: qty 2

## 2013-11-15 MED ORDER — KETOROLAC TROMETHAMINE 30 MG/ML IJ SOLN
30.0000 mg | Freq: Once | INTRAMUSCULAR | Status: AC
  Administered 2013-11-15: 30 mg via INTRAVENOUS
  Filled 2013-11-15: qty 1

## 2013-11-15 MED ORDER — GI COCKTAIL ~~LOC~~
ORAL | Status: AC
  Filled 2013-11-15: qty 30

## 2013-11-15 MED ORDER — GI COCKTAIL ~~LOC~~
30.0000 mL | Freq: Once | ORAL | Status: AC
  Administered 2013-11-15: 30 mL via ORAL

## 2013-11-15 MED ORDER — PROMETHAZINE HCL 25 MG/ML IJ SOLN
INTRAMUSCULAR | Status: AC
  Administered 2013-11-15: 12.5 mg via INTRAVENOUS
  Filled 2013-11-15: qty 1

## 2013-11-15 MED ORDER — SODIUM CHLORIDE 0.9 % IV BOLUS (SEPSIS)
1000.0000 mL | Freq: Once | INTRAVENOUS | Status: AC
  Administered 2013-11-15: 1000 mL via INTRAVENOUS

## 2013-11-15 NOTE — ED Notes (Signed)
Vomiting all night. Chest tightness that feels like a strain since violent vomiting started. He feels like he strained a muscle. States he was here a few days ago for back pain and given pain medication. Vomiting started after taking the pain medication. States he may be a diabetic. CBG 100.

## 2013-11-15 NOTE — ED Provider Notes (Signed)
CSN: [id number]     Arrival date & time 11/15/13  1155 History   First MD Initiated Contact with Patient 11/15/13 1248     Chief Complaint  Patient presents with  . Emesis     (Consider location/radiation/quality/duration/timing/severity/associated sxs/prior Treatment) HPI Comments: It is a patient is a 48 year old male with history of esophageal reflux. He presents today with complaints of nausea and vomiting that started last night. He has had multiple episodes of this. He denies any diarrhea and denies any blood. He denies any fevers or chills. He was given a pain medication here several days ago it his vomiting started shortly after taking this medication.  Patient is a 48 y.o. male presenting with vomiting. The history is provided by the patient.  Emesis Severity:  Moderate Duration:  12 hours Timing:  Constant Progression:  Worsening Chronicity:  New Relieved by:  Nothing Worsened by:  Nothing tried Ineffective treatments:  None tried Associated symptoms: abdominal pain   Associated symptoms: no chills, no diarrhea and no fever     Past Medical History  Diagnosis Date  . GERD (gastroesophageal reflux disease)   . Depression   . Lumbago   . ETOH abuse   . Cocaine abuse   . Xanax use disorder, mild, abuse   . Marijuana abuse   . Schizophrenia   . Hypertension     pt reports that he has hx   Past Surgical History  Procedure Laterality Date  . Back surgery    . Neck surgery    . Esophagogastroduodenoscopy (egd) with propofol N/A 07/30/2013    Procedure: ESOPHAGOGASTRODUODENOSCOPY (EGD) WITH PROPOFOL;  Surgeon: Barrie FolkJohn C Hayes, MD;  Location: [facility];  Service: Endoscopy;  Laterality: N/A;   Family History  Problem Relation Age of Onset  . Diabetes Mother   . Hypertension Mother   . Hyperlipidemia Father   . Heart attack Neg Hx   . Sudden death Neg Hx    History  Substance Use Topics  . Smoking status: Current Every Day Smoker -- 0.50 packs/day for 15 years     Types: Cigarettes    Last Attempt to Quit: 08/08/2012  . Smokeless tobacco: Never Used  . Alcohol Use: Yes     Comment: 12 pk daily    Review of Systems  Constitutional: Negative for chills.  Gastrointestinal: Positive for vomiting and abdominal pain. Negative for diarrhea.  All other systems reviewed and are negative.     Allergies  Ibuprofen  Home Medications   Prior to Admission medications   Medication Sig Start Date End Date Taking? Authorizing Provider  acetaminophen (TYLENOL) 325 MG tablet Take 650 mg by mouth every 6 (six) hours as needed.    Historical Provider, MD  aspirin 81 MG EC tablet Take 1 tablet (81 mg total) by mouth daily. 08/10/13   Christen BameNora Sadek, MD  atorvastatin (LIPITOR) 40 MG tablet Take 1 tablet (40 mg total) by mouth daily. 08/10/13   Christen BameNora Sadek, MD  cyclobenzaprine (FLEXERIL) 10 MG tablet Take 1 tablet (10 mg total) by mouth 3 (three) times daily. 07/29/13   Beau FannyJohn C Withrow, FNP  FLUoxetine (PROZAC) 20 MG capsule Take 1 capsule (20 mg total) by mouth daily. 07/29/13   Beau FannyJohn C Withrow, FNP  meloxicam (MOBIC) 15 MG tablet Take 1 tablet (15 mg total) by mouth daily. 11/14/13   April K Palumbo-Rasch, MD  methocarbamol (ROBAXIN) 500 MG tablet Take 1 tablet (500 mg total) by mouth 2 (two) times daily. 11/14/13  April K Palumbo-Rasch, MD  prazosin (MINIPRESS) 1 MG capsule Take 1 capsule (1 mg total) by mouth at bedtime. 07/29/13 07/29/14  Beau Fanny, FNP  predniSONE (DELTASONE) 20 MG tablet 3 tabs po day one, then 2 po daily x 4 days 11/14/13   April K Palumbo-Rasch, MD  risperidone (RISPERDAL) 4 MG tablet Take 4 mg by mouth at bedtime.     Historical Provider, MD   BP 146/80  Pulse 82  Temp(Src) 98.3 F (36.8 C) (Oral)  Resp 16  Ht 5\' 11"  (1.803 m)  Wt 199 lb (90.266 kg)  BMI 27.77 kg/m2  SpO2 97% Physical Exam  Nursing note and vitals reviewed. Constitutional: He is oriented to person, place, and time. He appears well-developed and well-nourished. No  distress.  HENT:  Head: Normocephalic and atraumatic.  Mouth/Throat: Oropharynx is clear and moist.  Neck: Normal range of motion. Neck supple.  Cardiovascular: Normal rate, regular rhythm and normal heart sounds.   No murmur heard. Pulmonary/Chest: Effort normal and breath sounds normal. No respiratory distress. He has no wheezes.  Abdominal: Soft. Bowel sounds are normal. He exhibits no distension. There is no tenderness.  Musculoskeletal: Normal range of motion. He exhibits no edema.  Lymphadenopathy:    He has no cervical adenopathy.  Neurological: He is alert and oriented to person, place, and time.  Skin: Skin is warm and dry. He is not diaphoretic.    ED Course  Procedures (including critical care time) Labs Review Labs Reviewed  URINALYSIS, ROUTINE W REFLEX MICROSCOPIC - Abnormal; Notable for the following:    pH 8.5 (*)    Protein, ur 100 (*)    All other components within normal limits  CBC WITH DIFFERENTIAL - Abnormal; Notable for the following:    WBC 12.7 (*)    RBC 6.02 (*)    Neutro Abs 8.1 (*)    Monocytes Absolute 1.1 (*)    All other components within normal limits  COMPREHENSIVE METABOLIC PANEL - Abnormal; Notable for the following:    Chloride 94 (*)    CO2 36 (*)    Glucose, Bld 120 (*)    Calcium 10.6 (*)    Total Protein 8.8 (*)    GFR calc non Af Amer 64 (*)    GFR calc Af Amer 74 (*)    All other components within normal limits  URINE MICROSCOPIC-ADD ON - Abnormal; Notable for the following:    Bacteria, UA FEW (*)    All other components within normal limits  CBG MONITORING, ED - Abnormal; Notable for the following:    Glucose-Capillary 100 (*)    All other components within normal limits  TROPONIN I  LIPASE, BLOOD    Imaging Review Dg Lumbar Spine Complete  11/14/2013   CLINICAL DATA:  Initial evaluation for acute back pain since last 9.  EXAM: LUMBAR SPINE - COMPLETE 4+ VIEW  COMPARISON:  Prior radiograph from 07/30/2013  FINDINGS: Five  non rib-bearing lumbar type vertebral bodies are present. Vertebral bodies are normally aligned with preservation of the normal lumbar lordosis. Vertebral body heights are preserved. No acute fracture listhesis.  Mild degenerative endplate spurring present at L4 and L5. No significant facet arthrosis.  Paraspinous soft tissues within normal limits.  IMPRESSION: 1. No acute abnormality within the lumbar spine. 2. Mild degenerative endplate spurring at L4 and L5.   Electronically Signed   By: Rise Mu M.D.   On: 11/14/2013 06:01     EKG Interpretation  Date/Time:  Thursday November 15 2013 12:28:31 EDT Ventricular Rate:  83 PR Interval:  134 QRS Duration: 82 QT Interval:  350 QTC Calculation: 411 R Axis:   61 Text Interpretation:  Normal sinus rhythm with sinus arrhythmia  Nonspecific ST and T wave abnormality Abnormal ECG Confirmed by DELOS  MD,  Mariella Blackwelder ([id number]) on 11/15/2013 12:49:39 PM      MDM   Final diagnoses:  None    Workup reveals no significant abnormalities in physical examination reveals a benign abdomen. His presentation, exam, and workup was consistent with a viral gastroenteritis. He is feeling better with IV fluids and medications in the ER and I believe is appropriate for discharge. He'll be prescribed antibiotics and advise return for any problems.    Geoffery Lyonsouglas Breaker Springer, MD 11/16/13 [phone]

## 2013-11-15 NOTE — Discharge Instructions (Signed)
Zofran as prescribed as needed for nausea.  Return to the emergency department if you develop severe abdominal pain, bloody stool, high fever, or other new and concerning symptoms.   Viral Gastroenteritis Viral gastroenteritis is also known as stomach flu. This condition affects the stomach and intestinal tract. It can cause sudden diarrhea and vomiting. The illness typically lasts 3 to 8 days. Most people develop an immune response that eventually gets rid of the virus. While this natural response develops, the virus can make you quite ill. CAUSES  Many different viruses can cause gastroenteritis, such as rotavirus or noroviruses. You can catch one of these viruses by consuming contaminated food or water. You may also catch a virus by sharing utensils or other personal items with an infected person or by touching a contaminated surface. SYMPTOMS  The most common symptoms are diarrhea and vomiting. These problems can cause a severe loss of body fluids (dehydration) and a body salt (electrolyte) imbalance. Other symptoms may include:  Fever.  Headache.  Fatigue.  Abdominal pain. DIAGNOSIS  Your caregiver can usually diagnose viral gastroenteritis based on your symptoms and a physical exam. A stool sample may also be taken to test for the presence of viruses or other infections. TREATMENT  This illness typically goes away on its own. Treatments are aimed at rehydration. The most serious cases of viral gastroenteritis involve vomiting so severely that you are not able to keep fluids down. In these cases, fluids must be given through an intravenous line (IV). HOME CARE INSTRUCTIONS   Drink enough fluids to keep your urine clear or pale yellow. Drink small amounts of fluids frequently and increase the amounts as tolerated.  Ask your caregiver for specific rehydration instructions.  Avoid:  Foods high in sugar.  Alcohol.  Carbonated drinks.  Tobacco.  Juice.  Caffeine  drinks.  Extremely hot or cold fluids.  Fatty, greasy foods.  Too much intake of anything at one time.  Dairy products until 24 to 48 hours after diarrhea stops.  You may consume probiotics. Probiotics are active cultures of beneficial bacteria. They may lessen the amount and number of diarrheal stools in adults. Probiotics can be found in yogurt with active cultures and in supplements.  Wash your hands well to avoid spreading the virus.  Only take over-the-counter or prescription medicines for pain, discomfort, or fever as directed by your caregiver. Do not give aspirin to children. Antidiarrheal medicines are not recommended.  Ask your caregiver if you should continue to take your regular prescribed and over-the-counter medicines.  Keep all follow-up appointments as directed by your caregiver. SEEK IMMEDIATE MEDICAL CARE IF:   You are unable to keep fluids down.  You do not urinate at least once every 6 to 8 hours.  You develop shortness of breath.  You notice blood in your stool or vomit. This may look like coffee grounds.  You have abdominal pain that increases or is concentrated in one small area (localized).  You have persistent vomiting or diarrhea.  You have a fever.  The patient is a child younger than 3 months, and he or she has a fever.  The patient is a child older than 3 months, and he or she has a fever and persistent symptoms.  The patient is a child older than 3 months, and he or she has a fever and symptoms suddenly get worse.  The patient is a baby, and he or she has no tears when crying. MAKE SURE YOU:   Understand  these instructions.  Will watch your condition.  Will get help right away if you are not doing well or get worse. Document Released: 01/11/2005 Document Revised: 04/05/2011 Document Reviewed: 10/28/2010 [facility] Patient Information 2015 [place], [place]. This information is not intended to replace advice given to you by your health care  provider. Make sure you discuss any questions you have with your health care provider.

## 2013-11-15 NOTE — ED Notes (Signed)
pts ride has arrived at this time

## 2013-11-16 ENCOUNTER — Encounter (HOSPITAL_BASED_OUTPATIENT_CLINIC_OR_DEPARTMENT_OTHER): Payer: Self-pay | Admitting: Emergency Medicine

## 2013-11-16 ENCOUNTER — Emergency Department (HOSPITAL_BASED_OUTPATIENT_CLINIC_OR_DEPARTMENT_OTHER)
Admission: EM | Admit: 2013-11-16 | Discharge: 2013-11-17 | Disposition: A | Discharge status: Home / Self Care | Payer: Self-pay | Attending: Emergency Medicine | Admitting: Emergency Medicine

## 2013-11-16 ENCOUNTER — Emergency Department (HOSPITAL_BASED_OUTPATIENT_CLINIC_OR_DEPARTMENT_OTHER): Payer: Self-pay

## 2013-11-16 DIAGNOSIS — Z791 Long term (current) use of non-steroidal anti-inflammatories (NSAID): Secondary | ICD-10-CM | POA: Insufficient documentation

## 2013-11-16 DIAGNOSIS — F209 Schizophrenia, unspecified: Secondary | ICD-10-CM | POA: Insufficient documentation

## 2013-11-16 DIAGNOSIS — I10 Essential (primary) hypertension: Secondary | ICD-10-CM | POA: Insufficient documentation

## 2013-11-16 DIAGNOSIS — Z7952 Long term (current) use of systemic steroids: Secondary | ICD-10-CM | POA: Insufficient documentation

## 2013-11-16 DIAGNOSIS — K219 Gastro-esophageal reflux disease without esophagitis: Secondary | ICD-10-CM | POA: Insufficient documentation

## 2013-11-16 DIAGNOSIS — R1013 Epigastric pain: Secondary | ICD-10-CM | POA: Insufficient documentation

## 2013-11-16 DIAGNOSIS — F329 Major depressive disorder, single episode, unspecified: Secondary | ICD-10-CM | POA: Insufficient documentation

## 2013-11-16 DIAGNOSIS — R112 Nausea with vomiting, unspecified: Secondary | ICD-10-CM | POA: Insufficient documentation

## 2013-11-16 DIAGNOSIS — Z7982 Long term (current) use of aspirin: Secondary | ICD-10-CM | POA: Insufficient documentation

## 2013-11-16 DIAGNOSIS — Z79899 Other long term (current) drug therapy: Secondary | ICD-10-CM | POA: Insufficient documentation

## 2013-11-16 DIAGNOSIS — Z720 Tobacco use: Secondary | ICD-10-CM | POA: Insufficient documentation

## 2013-11-16 LAB — CBC WITH DIFFERENTIAL/PLATELET
Basophils Absolute: 0 10*3/uL (ref 0.0–0.1)
Basophils Relative: 0 % (ref 0–1)
Eosinophils Absolute: 0 10*3/uL (ref 0.0–0.7)
Eosinophils Relative: 0 % (ref 0–5)
HCT: 46 % (ref 39.0–52.0)
HEMOGLOBIN: 15.8 g/dL (ref 13.0–17.0)
LYMPHS ABS: 3 10*3/uL (ref 0.7–4.0)
LYMPHS PCT: 32 % (ref 12–46)
MCH: 28 pg (ref 26.0–34.0)
MCHC: 34.3 g/dL (ref 30.0–36.0)
MCV: 81.6 fL (ref 78.0–100.0)
MONOS PCT: 8 % (ref 3–12)
Monocytes Absolute: 0.8 10*3/uL (ref 0.1–1.0)
Neutro Abs: 5.7 10*3/uL (ref 1.7–7.7)
Neutrophils Relative %: 60 % (ref 43–77)
PLATELETS: 309 10*3/uL (ref 150–400)
RBC: 5.64 MIL/uL (ref 4.22–5.81)
RDW: 14.5 % (ref 11.5–15.5)
WBC: 9.5 10*3/uL (ref 4.0–10.5)

## 2013-11-16 LAB — COMPREHENSIVE METABOLIC PANEL
ALT: 30 U/L (ref 0–53)
AST: 26 U/L (ref 0–37)
Albumin: 4 g/dL (ref 3.5–5.2)
Alkaline Phosphatase: 48 U/L (ref 39–117)
Anion gap: 15 (ref 5–15)
BILIRUBIN TOTAL: 1 mg/dL (ref 0.3–1.2)
BUN: 19 mg/dL (ref 6–23)
CO2: 29 meq/L (ref 19–32)
Calcium: 9.5 mg/dL (ref 8.4–10.5)
Chloride: 95 mEq/L — ABNORMAL LOW (ref 96–112)
Creatinine, Ser: 1.3 mg/dL (ref 0.50–1.35)
GFR calc Af Amer: 74 mL/min — ABNORMAL LOW (ref >90)
GFR, EST NON AFRICAN AMERICAN: 64 mL/min — AB (ref >90)
GLUCOSE: 96 mg/dL (ref 70–99)
POTASSIUM: 4 meq/L (ref 3.7–5.3)
Sodium: 139 mEq/L (ref 137–147)
Total Protein: 7.6 g/dL (ref 6.0–8.3)

## 2013-11-16 LAB — LIPASE, BLOOD: Lipase: 20 U/L (ref 11–59)

## 2013-11-16 LAB — OCCULT BLOOD X 1 CARD TO LAB, STOOL: Fecal Occult Bld: NEGATIVE

## 2013-11-16 LAB — TROPONIN I: Troponin I: <0.3 ng/mL (ref <0.30)

## 2013-11-16 MED ORDER — ONDANSETRON HCL 4 MG/2ML IJ SOLN
4.0000 mg | Freq: Once | INTRAMUSCULAR | Status: AC
  Administered 2013-11-16: 4 mg via INTRAVENOUS
  Filled 2013-11-16: qty 2

## 2013-11-16 MED ORDER — ONDANSETRON HCL 4 MG PO TABS: 4.0000 mg | ORAL_TABLET | Freq: Four times a day (QID) | ORAL | Status: DC

## 2013-11-16 MED ORDER — ONDANSETRON HCL 4 MG/2ML IJ SOLN: 4.0000 mg | Freq: Once | INTRAMUSCULAR | Status: DC

## 2013-11-16 MED ORDER — TRAMADOL HCL 50 MG PO TABS: 50.0000 mg | ORAL_TABLET | Freq: Four times a day (QID) | ORAL | Status: DC | PRN

## 2013-11-16 MED ORDER — SODIUM CHLORIDE 0.9 % IV SOLN: 80.0000 mg | Freq: Once | INTRAVENOUS | Status: AC

## 2013-11-16 MED ORDER — SODIUM CHLORIDE 0.9 % IV BOLUS (SEPSIS)
1000.0000 mL | Freq: Once | INTRAVENOUS | Status: AC
  Administered 2013-11-16: 1000 mL via INTRAVENOUS

## 2013-11-16 MED ORDER — SODIUM CHLORIDE 0.9 % IV BOLUS (SEPSIS): 1000.0000 mL | Freq: Once | INTRAVENOUS | Status: DC

## 2013-11-16 MED ORDER — PANTOPRAZOLE SODIUM 40 MG IV SOLR
INTRAVENOUS | Status: AC
  Administered 2013-11-16: 80 mg
  Filled 2013-11-16: qty 80

## 2013-11-16 NOTE — ED Notes (Signed)
Patient transported to X-ray 

## 2013-11-16 NOTE — ED Provider Notes (Signed)
CSN: [id number]     Arrival date & time 11/16/13  1823 History   First MD Initiated Contact with Patient 11/16/13 1832     Chief Complaint  Patient presents with  . Abdominal Pain     (Consider location/radiation/quality/duration/timing/severity/associated sxs/prior Treatment) HPI  Lonnie [name] is a 48 y.o. male complaining of 2x episodes of hematemesis earlier in the day. Patient has been vomiting for 3 days, he had diarrhea states he has not had a bowel movement in several days. Can not tolerate any by mouth. He has a history of alcohol abuse however he states he has not had any alcohol in 6 months. Denies excessive NSAID use however patient was given Toradol 30 mg IV by EMS. States his pain is in the epigastrium, 10 out of 10, relieved by the Toradol states is 5/10 right now. He denies melena, hematochezia, fever, chills, shortness of breath. On review of systems patient endorses a lower anterior chest pain. He's been taking Phenergan at home with no relief.  Past Medical History  Diagnosis Date  . GERD (gastroesophageal reflux disease)   . Depression   . Lumbago   . ETOH abuse   . Cocaine abuse   . Xanax use disorder, mild, abuse   . Marijuana abuse   . Schizophrenia   . Hypertension     pt reports that he has hx   Past Surgical History  Procedure Laterality Date  . Back surgery    . Neck surgery    . Esophagogastroduodenoscopy (egd) with propofol N/A 07/30/2013    Procedure: ESOPHAGOGASTRODUODENOSCOPY (EGD) WITH PROPOFOL;  Surgeon: Barrie FolkJohn C Hayes, MD;  Location: [facility];  Service: Endoscopy;  Laterality: N/A;   Family History  Problem Relation Age of Onset  . Diabetes Mother   . Hypertension Mother   . Hyperlipidemia Father   . Heart attack Neg Hx   . Sudden death Neg Hx    History  Substance Use Topics  . Smoking status: Current Every Day Smoker -- 0.50 packs/day for 15 years    Types: Cigarettes    Last Attempt to Quit: 08/08/2012  . Smokeless tobacco: Never  Used  . Alcohol Use: Yes     Comment: 12 pk daily    Review of Systems  10 systems reviewed and found to be negative, except as noted in the HPI.   Allergies  Ibuprofen  Home Medications   Prior to Admission medications   Medication Sig Start Date End Date Taking? Authorizing Provider  acetaminophen (TYLENOL) 325 MG tablet Take 650 mg by mouth every 6 (six) hours as needed.    Historical Provider, MD  aspirin 81 MG EC tablet Take 1 tablet (81 mg total) by mouth daily. 08/10/13   Christen BameNora Sadek, MD  atorvastatin (LIPITOR) 40 MG tablet Take 1 tablet (40 mg total) by mouth daily. 08/10/13   Christen BameNora Sadek, MD  cyclobenzaprine (FLEXERIL) 10 MG tablet Take 1 tablet (10 mg total) by mouth 3 (three) times daily. 07/29/13   Beau FannyJohn C Withrow, FNP  FLUoxetine (PROZAC) 20 MG capsule Take 1 capsule (20 mg total) by mouth daily. 07/29/13   Beau FannyJohn C Withrow, FNP  meloxicam (MOBIC) 15 MG tablet Take 1 tablet (15 mg total) by mouth daily. 11/14/13   April K Palumbo-Rasch, MD  methocarbamol (ROBAXIN) 500 MG tablet Take 1 tablet (500 mg total) by mouth 2 (two) times daily. 11/14/13   April K Palumbo-Rasch, MD  ondansetron (ZOFRAN ODT) 8 MG disintegrating tablet 8mg  ODT q4 hours  prn nausea 11/15/13   Geoffery Lyonsouglas Delo, MD  ondansetron (ZOFRAN) 4 MG tablet Take 1 tablet (4 mg total) by mouth every 6 (six) hours. 11/16/13   Matthew Cina, PA-C  prazosin (MINIPRESS) 1 MG capsule Take 1 capsule (1 mg total) by mouth at bedtime. 07/29/13 07/29/14  Beau FannyJohn C Withrow, FNP  predniSONE (DELTASONE) 20 MG tablet 3 tabs po day one, then 2 po daily x 4 days 11/14/13   April K Palumbo-Rasch, MD  risperidone (RISPERDAL) 4 MG tablet Take 4 mg by mouth at bedtime.     Historical Provider, MD  traMADol (ULTRAM) 50 MG tablet Take 1 tablet (50 mg total) by mouth every 6 (six) hours as needed. 11/16/13   Jonluke Cobbins, PA-C   BP 133/87  Pulse 84  Temp(Src) 98.5 F (36.9 C) (Oral)  Resp 16  SpO2 97% Physical Exam  Nursing note and vitals  reviewed. Constitutional: He is oriented to person, place, and time. He appears well-developed and well-nourished. No distress.  HENT:  Head: Normocephalic and atraumatic.  Mouth/Throat: Oropharynx is clear and moist.  Eyes: Conjunctivae and EOM are normal. Pupils are equal, round, and reactive to light.  Neck: Normal range of motion. Neck supple. No JVD present.  Cardiovascular: Normal rate, regular rhythm and intact distal pulses.   Pulmonary/Chest: Effort normal and breath sounds normal. No stridor. No respiratory distress. He has no wheezes. He has no rales. He exhibits no tenderness.  Abdominal: Soft. He exhibits no distension and no mass. There is tenderness. There is no rebound and no guarding.  Hyperactive bowel sounds, no guarding or rebound tender palpation in the epigastrium  Musculoskeletal: Normal range of motion. He exhibits no edema and no tenderness.  Neurological: He is alert and oriented to person, place, and time.  Skin:     Psychiatric: He has a normal mood and affect.    ED Course  Procedures (including critical care time) Labs Review Labs Reviewed  COMPREHENSIVE METABOLIC PANEL - Abnormal; Notable for the following:    Chloride 95 (*)    GFR calc non Af Amer 64 (*)    GFR calc Af Amer 74 (*)    All other components within normal limits  CBC WITH DIFFERENTIAL  LIPASE, BLOOD  TROPONIN I  OCCULT BLOOD X 1 CARD TO LAB, STOOL  POC OCCULT BLOOD, ED    Imaging Review Dg Abd Acute W/chest  11/16/2013   CLINICAL DATA:  Nausea vomiting diarrhea for 3 days  EXAM: ACUTE ABDOMEN SERIES (ABDOMEN 2 VIEW & CHEST 1 VIEW)  COMPARISON:  Abdomen radiographs 08/09/2013  FINDINGS: Normal heart, mediastinal, and hilar contours with normal pulmonary vascularity. The lungs are normally expanded and clear. The trachea is midline. Negative for pleural effusion or pneumothorax. Bowel gas pattern is nonobstructive. No significant stool burden appreciated. No abdominal mass effect. No  urinary tract stone visualized.  There are degenerative changes of the distal clavicle. No acute bony abnormality appreciated.  Atherosclerotic calcification the iliac vasculature is noted bilaterally.  IMPRESSION: Nonobstructive bowel gas pattern.  No acute cardiopulmonary disease.  Bilateral iliac artery atherosclerosis.   Electronically Signed   By: Britta MccreedySusan  Turner M.D.   On: 11/16/2013 20:10     EKG Interpretation None      MDM   Final diagnoses:  Abdominal pain, epigastric  Non-intractable vomiting with nausea, vomiting of unspecified type    Filed Vitals:   11/16/13 1852 11/16/13 1900  BP: 124/79 133/87  Pulse: 84 84  Temp: 98.5 F (36.9  C)   TempSrc: Oral   Resp: 16   SpO2: 98% 97%    Medications  sodium chloride 0.9 % bolus 1,000 mL (1,000 mLs Intravenous New Bag/Given 11/16/13 1943)  ondansetron (ZOFRAN) injection 4 mg (4 mg Intravenous Given 11/16/13 1942)  pantoprazole (PROTONIX) 80 mg in sodium chloride 0.9 % 100 mL IVPB (0 mg Intravenous Duplicate 11/16/13 1942)  pantoprazole (PROTONIX) 40 MG injection (80 mg  Given 11/16/13 1942)    Lonnie [name] is a 48 y.o. male presenting with multiple episodes of vomiting over the course of 3 days. Patient states he is vomiting frank blood 2 times day. Has a history of alcohol abuse however states he has not drank in 6 months. Patient was given Toradol IM via EMS. Patient does not look acutely ill. Plan is to fluid bolus, acute abdominal series, blood work and basic cardiac workup. Patient had EGD in July of this year which showed no esophageal varices.  The real abdominal exams remain benign. Blood work with no acute abnormalities, acute abdominal series is normal. Patient reports significant subjective improvement after hydration. He is tolerating by mouth and amenable to discharge. Vital signs remained stable.  Evaluation does not show pathology that would require ongoing emergent intervention or inpatient treatment. Pt is  hemodynamically stable and mentating appropriately. Discussed findings and plan with patient/guardian, who agrees with care plan. All questions answered. Return precautions discussed and outpatient follow up given.   New Prescriptions   ONDANSETRON (ZOFRAN) 4 MG TABLET    Take 1 tablet (4 mg total) by mouth every 6 (six) hours.   TRAMADOL (ULTRAM) 50 MG TABLET    Take 1 tablet (50 mg total) by mouth every 6 (six) hours as needed.         Wynetta Emery, PA-C 11/16/13 2303

## 2013-11-16 NOTE — Discharge Instructions (Signed)
Push fluids: take small frequent sips of water or Gatorade, do not drink any soda, juice or caffeinated beverages.   ° °Slowly resume solid diet as desired. Avoid food that are spicy, contain dairy and/or have high fat content. ° °Aviod NSAIDs (aspirin, motrin, ibuprofen, naproxen, Aleve et cetera) for pain control because they will irritate your stomach. ° °Do not hesitate to return to the emergency room for any new, worsening or concerning symptoms. ° °Please obtain primary care using resource guide below. But the minute you were seen in the emergency room and that they will need to obtain records for further outpatient management. ° ° ° ° °Emergency Department Resource Guide °1) Find a Doctor and Pay Out of Pocket °Although you won't have to find out who is covered by your insurance plan, it is a good idea to ask around and get recommendations. You will then need to call the office and see if the doctor you have chosen will accept you as a new patient and what types of options they offer for patients who are self-pay. Some doctors offer discounts or will set up payment plans for their patients who do not have insurance, but you will need to ask so you aren't surprised when you get to your appointment. ° °2) Contact Your Local Health Department °Not all health departments have doctors that can see patients for sick visits, but many do, so it is worth a call to see if yours does. If you don't know where your local health department is, you can check in your phone book. The CDC also has a tool to help you locate your state's health department, and many state websites also have listings of all of their local health departments. ° °3) Find a Walk-in Clinic °If your illness is not likely to be very severe or complicated, you may want to try a walk in clinic. These are popping up all over the country in pharmacies, drugstores, and shopping centers. They're usually staffed by nurse practitioners or physician assistants  that have been trained to treat common illnesses and complaints. They're usually fairly quick and inexpensive. However, if you have serious medical issues or chronic medical problems, these are probably not your best option. ° °No Primary Care Doctor: °- Call [facility] at  832-8000 - they can help you locate a primary care doctor that  accepts your insurance, provides certain services, etc. °- Physician Referral Service- 1-800-533-3463 ° °Chronic Pain Problems: °Organization         Address  Phone   Notes  °[facility]  (336) 297-2271 Patients need to be referred by their primary care doctor.  ° °Medication Assistance: °Organization         Address  Phone   Notes  °[place] [facility] [address] °[place], [place] [address] (336) 641-8030 --Must be a resident of [place] °-- Must have NO insurance coverage whatsoever (no Medicaid/ Medicare, etc.) °-- The pt. MUST have a primary care doctor that directs their care regularly and follows them in the community °  °[facility]  (866) 331-1348   °[employer]  (888) 892-1162   ° °Agencies that provide inexpensive medical care: °Organization         Address  Phone   Notes  °[facility]  (336) 832-8035   °[facility]    (336) 832-7272   °[facility] [address] °[place], [place] [address] (336)   832-4777   °[facility] [address], °[place] (336) 271-4999   °[facility]    (336) 373-0678   °[facility]    (336) 272-1050   °[facility] ° [address], [place] Phone:  (336) 832-4444, Fax:  (336) 832-4440 Hours of Operation:  9 am - 6 pm, M-F.  Also accepts Medicaid/Medicare and self-pay.  °[facility] ° [address], [place] Phone: (336) 832-3150, Fax: (336) [phone]. Hours of Operation:  8:30 am - 5:30 pm, M-F.  Also accepts Medicaid  and self-pay.  °[facility] [place] [address], [place] Phone: (336) 878-6027   °[facility] [address], [place], [place] (336)723-1848, Ext. 123 Mondays & Thursdays: 7-9 AM.  First 15 patients are seen on a first come, first serve basis. °  ° °Medicaid-accepting [place] Providers: ° °Organization         Address  Phone   Notes  °[facility] [address], [place] (336) 641-2100 Also accepts self-pay patients.  °[facility] [address], [place] ° (336) 856-9996   °[facility] [address], [place] (336) 288-8857   °[facility] [address] [place] [address], [place] (336) 299-7000   °Veita Bland [address], [place]  ° (336) 373-1557 Only accepts [place] Access Medicaid patients after they have their name applied to their card.  ° °Self-Pay (no insurance) in [place]: ° °Organization         Address  Phone   Notes  °Sickle Cell Patients, [facility] [address], [place] (336) 832-1970   °[facility] [address], [place] (336) 832-4400   °[facility] ° [address], [place] (336) 992-4800   °[facility]/Dr. Osei-Bonsu ° [address] [place] [address], [place] or [address], [place] (336) 841-8500 Phone number for both [place] and [place] locations is the same.  °[facility] [address], [place] (336) 299-0000   °[facility] [address] [place] [address], [place] or [address] (336) 852-7530 °(336) 878-2260   °[facility] [address], [place] (336) 350-1642, phone; (336) 294-5005, fax Sees patients 1st and 3rd Saturday of every month.  Must not qualify for public or private insurance (i.e. Medicaid, Medicare, [place] Health Choice, Veterans' Benefits) • Household income  should be no more than 200% of the poverty level •The clinic cannot treat you if you are pregnant or think you are pregnant • Sexually transmitted diseases are not treated at the clinic.  ° ° °Dental Care: °Organization         Address  Phone  Notes  °[facility] [facility] [address],  (336) 641-6152 Accepts children up to age 21 who are enrolled in Medicaid or [place] Health Choice; pregnant women with a Medicaid card; and children who have applied for Medicaid or [place] Health Choice, but were declined, whose parents can pay a reduced fee at time of service.  °[facility] [place]  [address], [place] (336) 641-7733 Accepts children up to age 21 who are enrolled in Medicaid or  Health Choice; pregnant women with a   Medicaid card; and children who have applied for Medicaid or [place] Health Choice, but were declined, whose parents can pay a reduced fee at time of service.  °[facility] ° [address], [place] (336) 641-4533 Patients are seen by appointment only. Walk-ins are not accepted. [facility] will see patients 18 years of age and older. °Monday - Tuesday (8am-5pm) °Most Wednesdays (8:30-5pm) °$30 per visit, cash only  °[facility] ° [address], [place] (336) 641-4533 Patients are seen by appointment only. Walk-ins are not accepted. [facility] will see patients 18 years of age and older. °One Wednesday Evening (Monthly: Volunteer Based).  $30 per visit, cash only  °[facility]  (919) 537-3737 for adults; Children under age 4, call Graduate Pediatric Dentistry at (919) [phone]. Children aged 4-14, please call (919) [phone] to request a pediatric application. ° Dental services are provided in all areas of dental care including fillings, crowns and bridges, complete and partial dentures, implants, gum treatment,  root canals, and extractions. Preventive care is also provided. Treatment is provided to both adults and children. °Patients are selected via a lottery and there is often a waiting list. °  °[facility] [address], °[place] ° (336) 763-8833 [email or web address] °  °[facility] [address], [place], [place] (336)723-1848, Ext. 123 Second and Fourth Thursday of each month, opens at 6:30 AM; Clinic ends at 9 AM.  Patients are seen on a first-come first-served basis, and a limited number are seen during each clinic.  ° °[facility] ° [address], [place], [place] (336) 723-7904   Eligibility Requirements °You must have lived in [place], [place], or [place] counties for at least the last three months. °  You cannot be eligible for state or federal sponsored healthcare insurance, including Veterans Administration, Medicaid, or Medicare. °  You generally cannot be eligible for healthcare insurance through your employer.  °  How to apply: °Eligibility screenings are held every Tuesday and Wednesday afternoon from 1:00 pm until 4:00 pm. You do not need an appointment for the interview!  °[facility] [address], [place], [place] 336-631-2330   °[facility]  336-342-8273   °[facility]  336-703-3100   °[facility]  336-570-6415   ° °Behavioral Health Resources in the Community: °Intensive Outpatient Programs °Organization         Address  Phone  Notes  °[facility] [address], [place], [place] 336-878-6098   °[facility] [address], [place], [place] 336-832-9800   °[facility] [address], [place], [place] ° 336-882-2125   °[facility] [address],  °[place],  1-800-853-5163 or 336-641-4981   °Substance Abuse Resources °Organization         Address  Phone  Notes  °[facility]   336-882-2125   °[facility]  336-784-9470   °[facility]  336-285-9073   °[facility]  336-845-3988   °Residential & Outpatient Substance Abuse Program  1-800-659-3381   °Psychological Services °Organization         Address  Phone  Notes  °[facility]  336- 832-9600   °[facility]  336- 378-7881   °[facility] [address],  1-800-853-5163 or 336-641-4981   ° °Mobile Crisis Teams °Organization           Address  Phone  Notes  °[facility], Mobile Crisis Care Unit  1-877-626-1772   °Assertive °[facility] ° [address] [place], [place] 336-834-9664   °Sharon DeEsch [address] °[place] 336-554-5454   ° °Self-Help/Support Groups °Organization         Address  Phone             Notes  °[facility] - variety of support groups  336- 373-1402 Call for more information  °Narcotics Anonymous (NA), [facility] [address], °[place]  2 meetings at this location  ° °Residential Treatment Programs °Organization         Address  Phone  Notes  °[facility] [address],    °[place]  1-866-801-8205   °[facility] ° [address], [place], [place] 704-293-8524   °[facility] [address], [place] 336-845-3988 Admissions: 8am-3pm M-F  °[facility] [address]    °[place], [place] 336-841-1104   °[facility] [address], [place], [place] 336-379-7146   °[facility] [address]  °[place], [place] 336-285-9073   °[facility] Intensive Outpatient [address], [place], [place] 336-852-3033   °[facility] [address]  °[place], [place] 1-877-615-2722 or 336-784-9470   °[facility] [address] [place], [place] 336-227-7417 Accepts Medicaid  °[facility] [address]  °[place] 1-800-659-3381 Substance Abuse/Addiction Treatment  ° °[place] Behavioral Health Resources °Organization         Address  Phone  Notes  °[facility]  (888) 581-9988   °Julie Brannon, PhD [address] [place], [place]   (336) 349-5553 or (336) 951-0000   °[facility]   [address] °[place], [place] (336) 349-4454   °[facility] [address], [place], [place] (336) 342-8316 Insurance/Medicaid/sponsorship through [facility]  °[facility] [address]                                    [place],  (336) 342-8316 Therapy/tele-psych/case  °[facility] [address]  ° [place],  (336) 349-2233    °Dr. Arfeen  (336) 349-4544   °[facility] [place]  [employer] [place] [facility] 1) [address], [place] °2) [address], [place] °3)  [address], [place] (336) 349-3220 °(336) 342-7768 ° °(336) 342-8140   °[place] [facility] (336) 342-1394 or (336) 342-3537 (After Hours)    ° ° ° ° °

## 2013-11-16 NOTE — ED Notes (Addendum)
EMS reports the patient has a three day history of nausea, vomiting and diarrhea, and generalized abdominal pain.  Has been [facility] at [facility] for the same for the last three days. 20 G angiocath inserted by EMS in Rt AC, Toradol 30 mg and zofran 4mg  given IV.

## 2013-11-17 NOTE — ED Provider Notes (Signed)
Medical screening examination/treatment/procedure(s) were conducted as a shared visit with non-physician practitioner(s) and myself.  I personally evaluated the patient during the encounter.   EKG Interpretation None      I interviewed and examined the patient. Lungs are CTAB. Cardiac exam wnl. Abdomen soft.  Pt has not had any vomiting here. I interpreted/reviewed the labs and/or imaging which were non-contributory.  Is tolerating po. I reviewed his previous EGD which showed no evidence of varices. Plan for d/c home. I informed him to return if the hematemesis continues.   Purvis SheffieldForrest Kaytelynn Scripter, MD 11/17/13 1034

## 2013-11-20 ENCOUNTER — Encounter (HOSPITAL_COMMUNITY): Payer: Self-pay | Admitting: Emergency Medicine

## 2013-11-20 ENCOUNTER — Emergency Department (HOSPITAL_COMMUNITY)
Admission: EM | Admit: 2013-11-20 | Discharge: 2013-11-21 | Disposition: A | Discharge status: Home / Self Care | Payer: Self-pay | Attending: Emergency Medicine | Admitting: Emergency Medicine

## 2013-11-20 ENCOUNTER — Emergency Department (HOSPITAL_COMMUNITY): Payer: Self-pay

## 2013-11-20 DIAGNOSIS — Z791 Long term (current) use of non-steroidal anti-inflammatories (NSAID): Secondary | ICD-10-CM | POA: Insufficient documentation

## 2013-11-20 DIAGNOSIS — Z720 Tobacco use: Secondary | ICD-10-CM | POA: Insufficient documentation

## 2013-11-20 DIAGNOSIS — I10 Essential (primary) hypertension: Secondary | ICD-10-CM | POA: Insufficient documentation

## 2013-11-20 DIAGNOSIS — K2970 Gastritis, unspecified, without bleeding: Secondary | ICD-10-CM | POA: Insufficient documentation

## 2013-11-20 DIAGNOSIS — F329 Major depressive disorder, single episode, unspecified: Secondary | ICD-10-CM | POA: Insufficient documentation

## 2013-11-20 DIAGNOSIS — Z79899 Other long term (current) drug therapy: Secondary | ICD-10-CM | POA: Insufficient documentation

## 2013-11-20 DIAGNOSIS — Z7982 Long term (current) use of aspirin: Secondary | ICD-10-CM | POA: Insufficient documentation

## 2013-11-20 DIAGNOSIS — K920 Hematemesis: Secondary | ICD-10-CM | POA: Insufficient documentation

## 2013-11-20 LAB — CBC WITH DIFFERENTIAL/PLATELET
BASOS ABS: 0 10*3/uL (ref 0.0–0.1)
Basophils Relative: 0 % (ref 0–1)
Eosinophils Absolute: 0.1 10*3/uL (ref 0.0–0.7)
Eosinophils Relative: 1 % (ref 0–5)
HCT: 44.8 % (ref 39.0–52.0)
Hemoglobin: 15.6 g/dL (ref 13.0–17.0)
LYMPHS PCT: 41 % (ref 12–46)
Lymphs Abs: 2.9 10*3/uL (ref 0.7–4.0)
MCH: 28.3 pg (ref 26.0–34.0)
MCHC: 34.8 g/dL (ref 30.0–36.0)
MCV: 81.3 fL (ref 78.0–100.0)
Monocytes Absolute: 0.5 10*3/uL (ref 0.1–1.0)
Monocytes Relative: 7 % (ref 3–12)
Neutro Abs: 3.5 10*3/uL (ref 1.7–7.7)
Neutrophils Relative %: 51 % (ref 43–77)
PLATELETS: 334 10*3/uL (ref 150–400)
RBC: 5.51 MIL/uL (ref 4.22–5.81)
RDW: 13.5 % (ref 11.5–15.5)
WBC: 7 10*3/uL (ref 4.0–10.5)

## 2013-11-20 LAB — COMPREHENSIVE METABOLIC PANEL
ALT: 52 U/L (ref 0–53)
AST: 31 U/L (ref 0–37)
Albumin: 4 g/dL (ref 3.5–5.2)
Alkaline Phosphatase: 45 U/L (ref 39–117)
Anion gap: 13 (ref 5–15)
BILIRUBIN TOTAL: 1.7 mg/dL — AB (ref 0.3–1.2)
BUN: 11 mg/dL (ref 6–23)
CHLORIDE: 95 meq/L — AB (ref 96–112)
CO2: 28 meq/L (ref 19–32)
Calcium: 9.6 mg/dL (ref 8.4–10.5)
Creatinine, Ser: 1.14 mg/dL (ref 0.50–1.35)
GFR, EST AFRICAN AMERICAN: 87 mL/min — AB (ref >90)
GFR, EST NON AFRICAN AMERICAN: 75 mL/min — AB (ref >90)
GLUCOSE: 97 mg/dL (ref 70–99)
Potassium: 4.7 mEq/L (ref 3.7–5.3)
Sodium: 136 mEq/L — ABNORMAL LOW (ref 137–147)
Total Protein: 7.5 g/dL (ref 6.0–8.3)

## 2013-11-20 LAB — POC OCCULT BLOOD, ED: FECAL OCCULT BLD: NEGATIVE

## 2013-11-20 LAB — URINALYSIS, ROUTINE W REFLEX MICROSCOPIC
Bilirubin Urine: NEGATIVE
Glucose, UA: NEGATIVE mg/dL
Hgb urine dipstick: NEGATIVE
KETONES UR: 15 mg/dL — AB
Leukocytes, UA: NEGATIVE
NITRITE: NEGATIVE
Protein, ur: NEGATIVE mg/dL
SPECIFIC GRAVITY, URINE: 1.024 (ref 1.005–1.030)
UROBILINOGEN UA: 1 mg/dL (ref 0.0–1.0)
pH: 6.5 (ref 5.0–8.0)

## 2013-11-20 LAB — CBG MONITORING, ED: Glucose-Capillary: 112 mg/dL — ABNORMAL HIGH (ref 70–99)

## 2013-11-20 LAB — LIPASE, BLOOD: LIPASE: 24 U/L (ref 11–59)

## 2013-11-20 MED ORDER — FAMOTIDINE IN NACL 20-0.9 MG/50ML-% IV SOLN
20.0000 mg | Freq: Once | INTRAVENOUS | Status: AC
  Administered 2013-11-20: 20 mg via INTRAVENOUS
  Filled 2013-11-20: qty 50

## 2013-11-20 MED ORDER — PROMETHAZINE HCL 25 MG/ML IJ SOLN
12.5000 mg | Freq: Once | INTRAMUSCULAR | Status: AC
  Administered 2013-11-20: 12.5 mg via INTRAVENOUS
  Filled 2013-11-20: qty 1

## 2013-11-20 MED ORDER — PANTOPRAZOLE SODIUM 40 MG IV SOLR
40.0000 mg | INTRAVENOUS | Status: AC
  Administered 2013-11-21: 40 mg via INTRAVENOUS
  Filled 2013-11-20: qty 40

## 2013-11-20 MED ORDER — IOHEXOL 300 MG/ML  SOLN
100.0000 mL | Freq: Once | INTRAMUSCULAR | Status: AC | PRN
  Administered 2013-11-20: 100 mL via INTRAVENOUS

## 2013-11-20 MED ORDER — MORPHINE SULFATE 4 MG/ML IJ SOLN
4.0000 mg | Freq: Once | INTRAMUSCULAR | Status: AC
  Administered 2013-11-20: 4 mg via INTRAVENOUS
  Filled 2013-11-20: qty 1

## 2013-11-20 MED ORDER — ONDANSETRON HCL 4 MG/2ML IJ SOLN
4.0000 mg | Freq: Once | INTRAMUSCULAR | Status: AC
  Administered 2013-11-20: 4 mg via INTRAVENOUS
  Filled 2013-11-20: qty 2

## 2013-11-20 MED ORDER — SODIUM CHLORIDE 0.9 % IV BOLUS (SEPSIS)
1000.0000 mL | Freq: Once | INTRAVENOUS | Status: AC
  Administered 2013-11-20: 1000 mL via INTRAVENOUS

## 2013-11-20 MED ORDER — IOHEXOL 300 MG/ML  SOLN
25.0000 mL | Freq: Once | INTRAMUSCULAR | Status: AC | PRN
  Administered 2013-11-20: 25 mL via ORAL

## 2013-11-20 NOTE — ED Notes (Signed)
Pt to CT at this time.

## 2013-11-20 NOTE — ED Provider Notes (Signed)
CSN: [id number]     Arrival date & time 11/20/13  4098 History   First MD Initiated Contact with Patient 11/20/13 2124     Chief Complaint  Patient presents with  . Emesis  . Hematemesis     (Consider location/radiation/quality/duration/timing/severity/associated sxs/prior Treatment) HPI Comments: The patient is a 48 year old male presenting to the emergency department the chief complaint of abdominal discomfort since today. He reports multiple episodes over the last 1 week with 2 ED visits. He reports onset of pain today, described as sore, constant, generalized, with partially relieved with fetal position. He reports associated hematemesis. Patient reports compliance wit BRAT diet as previously instructed, he states that after eating oatmeal he had a vomiting episode.  Patient also reports pain worsen with food. And dark blood in stool. He denies fever, recent travel.  Patient is a 48 y.o. male presenting with vomiting. The history is provided by the patient. No language interpreter was used.  Emesis   Past Medical History  Diagnosis Date  . GERD (gastroesophageal reflux disease)   . Depression   . Lumbago   . ETOH abuse   . Cocaine abuse   . Xanax use disorder, mild, abuse   . Marijuana abuse   . Schizophrenia   . Hypertension     pt reports that he has hx   Past Surgical History  Procedure Laterality Date  . Back surgery    . Neck surgery    . Esophagogastroduodenoscopy (egd) with propofol N/A 07/30/2013    Procedure: ESOPHAGOGASTRODUODENOSCOPY (EGD) WITH PROPOFOL;  Surgeon: Barrie Folk, MD;  Location: [facility];  Service: Endoscopy;  Laterality: N/A;   Family History  Problem Relation Age of Onset  . Diabetes Mother   . Hypertension Mother   . Hyperlipidemia Father   . Heart attack Neg Hx   . Sudden death Neg Hx    History  Substance Use Topics  . Smoking status: Current Every Day Smoker -- 0.50 packs/day for 15 years    Types: Cigarettes    Last Attempt to  Quit: 08/08/2012  . Smokeless tobacco: Never Used  . Alcohol Use: Yes     Comment: 12 pk daily    Review of Systems  Gastrointestinal: Positive for vomiting.      Allergies  Ibuprofen  Home Medications   Prior to Admission medications   Medication Sig Start Date End Date Taking? Authorizing Provider  acetaminophen (TYLENOL) 325 MG tablet Take 650 mg by mouth every 6 (six) hours as needed for mild pain.    Yes Historical Provider, MD  aspirin EC 81 MG tablet Take 81 mg by mouth daily.   Yes Historical Provider, MD  atorvastatin (LIPITOR) 40 MG tablet Take 40 mg by mouth daily.   Yes Historical Provider, MD  FLUoxetine (PROZAC) 20 MG capsule Take 20 mg by mouth daily.   Yes Historical Provider, MD  meloxicam (MOBIC) 15 MG tablet Take 15 mg by mouth daily.   Yes Historical Provider, MD  methocarbamol (ROBAXIN) 500 MG tablet Take 500 mg by mouth 2 (two) times daily.   Yes Historical Provider, MD  ondansetron (ZOFRAN) 4 MG tablet Take 4 mg by mouth every 6 (six) hours as needed for nausea or vomiting.   Yes Historical Provider, MD  prazosin (MINIPRESS) 1 MG capsule Take 1 mg by mouth at bedtime.   Yes Historical Provider, MD  risperidone (RISPERDAL) 4 MG tablet Take 4 mg by mouth at bedtime.    Yes Historical Provider, MD  traMADol (ULTRAM) 50 MG tablet Take 50 mg by mouth every 6 (six) hours as needed for moderate pain.   Yes Historical Provider, MD   BP 129/80  Pulse 82  Temp(Src) 98.8 F (37.1 C) (Oral)  Resp 18  SpO2 99% Physical Exam  Nursing note and vitals reviewed. Constitutional: He is oriented to person, place, and time. He appears well-developed and well-nourished. No distress.  HENT:  Head: Normocephalic and atraumatic.  Eyes: EOM are normal. Pupils are equal, round, and reactive to light. No scleral icterus.  Neck: Neck supple.  Cardiovascular: Normal rate and regular rhythm.   Pulmonary/Chest: Effort normal and breath sounds normal. He has no wheezes. He has no  rales.  Abdominal: Soft. Bowel sounds are normal. He exhibits no distension. There is no tenderness. There is no rebound and no guarding.  Distractible generalized tenderness. No tenderness with palpation of stethoscope.  Genitourinary:  Tan stool on glove, no obvious blood.  Neurological: He is alert and oriented to person, place, and time.  Skin: Skin is warm and dry.  Psychiatric: He has a normal mood and affect. His behavior is normal.    ED Course  Procedures (including critical care time) Labs Review Labs Reviewed  COMPREHENSIVE METABOLIC PANEL - Abnormal; Notable for the following:    Sodium 136 (*)    Chloride 95 (*)    Total Bilirubin 1.7 (*)    GFR calc non Af Amer 75 (*)    GFR calc Af Amer 87 (*)    All other components within normal limits  URINALYSIS, ROUTINE W REFLEX MICROSCOPIC - Abnormal; Notable for the following:    Color, Urine AMBER (*)    Ketones, ur 15 (*)    All other components within normal limits  CBG MONITORING, ED - Abnormal; Notable for the following:    Glucose-Capillary 112 (*)    All other components within normal limits  CBC WITH DIFFERENTIAL  LIPASE, BLOOD  POC OCCULT BLOOD, ED    Imaging Review Ct Abdomen Pelvis W Contrast  11/21/2013   CLINICAL DATA:  Hematemesis, severe mid upper abdominal pain.  EXAM: CT ABDOMEN AND PELVIS WITH CONTRAST  TECHNIQUE: Multidetector CT imaging of the abdomen and pelvis was performed using the standard protocol following bolus administration of intravenous contrast.  CONTRAST:  100mL OMNIPAQUE IOHEXOL 300 MG/ML  SOLN  COMPARISON:  None.  FINDINGS: Lung bases are clear.  Normal heart size.  Decreased hepatic attenuation is most in keeping with hepatic steatosis. Rounded area of relative hyperattenuation subcapsular (series 201, image 15), measures 1 cm. In addition, geographic area of relative hyperattenuation within segment 4 on image 23. These areas become isodense to liver parenchyma on the delayed phase.  No  radiodense gallstones or biliary ductal dilatation.  No appreciable abnormality of the spleen, pancreas, adrenal glands.  Symmetric renal enhancement.  No hydroureteronephrosis.  Nonspecific gastric wall thickening. No overt colitis. Normal appendix. Small bowel loops are of normal course and caliber. No free intraperitoneal air or fluid. No lymphadenopathy.  Normal caliber aorta and branch vessels with mild scattered atherosclerotic disease.  Circumferential bladder wall thickening. Prostate gland measures 4.2 cm transverse diameter. Fat containing right inguinal hernia. Small fat containing left inguinal hernia and/or spermatic cord lipoma.  Right SI joint ankylosis. Mild multilevel degenerative changes, most pronounced at L5-S1.  IMPRESSION: Thickened gastric folds may reflect gastritis given the provided history. Consider EGD follow-up.  Hepatic steatosis. Relative areas of hyperattenuation are favored to reflect areas of fatty sparing. Recommend  nonemergent ultrasound.  Circumferential bladder wall thickening. Correlate with urinalysis to exclude cystitis.   Electronically Signed   By: Jearld LeschAndrew  DelGaizo M.D.   On: 11/21/2013 00:34     EKG Interpretation None      MDM   Final diagnoses:  Hematemesis  Gastritis   patient presents with generalized abdominal pain, distractible complains of hematemesis, and blood in stool. Patient's Hemoccult is negative not actively vomiting in ED at this time. Patient has been evaluated 10/22 and 10/23 of this month for similar complaints and diagnosed with gastritis. Labs consistent with previous values slight elevation of bilirubin. UA without signs of infection. CT shows gastritis, bladder wall thickening no hematuria, dysuria, UA without infection. Discussed lab results, imaging results, and treatment plan with the patient and patient's aunt. Return precautions given. Advised follow-up with GI specialist for endoscopy. Discussed reflux food choices,  clear liquid  diet for 48 hours followed by BRAT diet for 48 hours Reports understanding and no other concerns at this time.  Patient is stable for discharge at this time. Meds given in ED:  Medications  sodium chloride 0.9 % bolus 1,000 mL (1,000 mLs Intravenous New Bag/Given 11/20/13 2239)  famotidine (PEPCID) IVPB 20 mg (20 mg Intravenous New Bag/Given 11/20/13 2239)  iohexol (OMNIPAQUE) 300 MG/ML solution 25 mL (25 mLs Oral Contrast Given 11/20/13 2256)  promethazine (PHENERGAN) injection 12.5 mg (12.5 mg Intravenous Given 11/20/13 2323)  ondansetron (ZOFRAN) injection 4 mg (4 mg Intravenous Given 11/20/13 2321)  morphine 4 MG/ML injection 4 mg (4 mg Intravenous Given 11/20/13 2326)  pantoprazole (PROTONIX) injection 40 mg (40 mg Intravenous Given 11/21/13 0005)  iohexol (OMNIPAQUE) 300 MG/ML solution 100 mL (100 mLs Intravenous Contrast Given 11/20/13 2335)    New Prescriptions   OMEPRAZOLE (PRILOSEC) 20 MG CAPSULE    Take 1 capsule (20 mg total) by mouth daily.   PROMETHAZINE (PHENERGAN) 12.5 MG TABLET    Take 1 tablet (12.5 mg total) by mouth every 6 (six) hours as needed for nausea or vomiting.        Mellody DrownLauren Lauralie Blacksher, PA-C 11/22/13 1416

## 2013-11-20 NOTE — ED Notes (Signed)
Pt reports abd pain and n/v. Denies diarrhea. Pt has been to [facility] multiple times over the past week, still having n/v and now vomiting blood. No acute distress noted at triage.

## 2013-11-20 NOTE — ED Notes (Signed)
Pt vomiting, orders obtained.

## 2013-11-21 MED ORDER — PROMETHAZINE HCL 12.5 MG PO TABS: 12.5000 mg | ORAL_TABLET | Freq: Four times a day (QID) | ORAL | Status: DC | PRN

## 2013-11-21 MED ORDER — OMEPRAZOLE 20 MG PO CPDR: 20.0000 mg | DELAYED_RELEASE_CAPSULE | Freq: Every day | ORAL | Status: DC

## 2013-11-21 NOTE — Discharge Instructions (Signed)
Call a GI specialist for further evaluation of your GI symptoms. Call for a follow up appointment with a Family or Primary Care Provider.  Return if Symptoms worsen.   Take medication as prescribed. Clear liquid diet for 48 hours, advance to SU[employer]AT diet (Bananas, Rice, applesauce, Toast) for the next 48 hours, advance diet as tolerated after.     Emergency Department Resource Guide 1) Find a Doctor and Pay Out of Pocket Although you won't have to find out who is covered by your insurance plan, it is a good idea to ask around and get recommendations. You will then need to call the office and see if the doctor you have chosen will accept you as a new patient and what types of options they offer for patients who are self-pay. Some doctors offer discounts or will set up payment plans for their patients who do not have insurance, but you will need to ask so you aren't surprised when you get to your appointment.  2) Contact Your Local Health Department Not all health departments have doctors that can see patients for sick visits, but many do, so it is worth a call to see if yours does. If you don't know where your local health department is, you can check in your phone book. The CDC also has a tool to help you locate your state's health department, and many state websites also have listings of all of their local health departments.  3) Find a Walk-in Clinic If your illness is not likely to be very severe or complicated, you may want to try a walk in clinic. These are popping up all over the country in pharmacies, drugstores, and shopping centers. They're usually staffed by nurse practitioners or physician assistants that have been trained to treat common illnesses and complaints. They're usually fairly quick and inexpensive. However, if you have serious medical issues or chronic medical problems, these are probably not your best option.  No Primary Care Doctor: - Call Health Connect at  [phone] - they  can help you locate a primary care doctor that  accepts your insurance, provides certain services, etc. - Physician Referral Service- [phone]  Chronic Pain Problems: Organization         Address  Phone   Notes  [facility]  [phone] Patients need to be referred by their primary care doctor.   Medication Assistance: Organization         Address  Phone   Notes  [facility] [address] [place], [place] [id number] [phone] --Must be a resident of [facility] [place] [facility] [place] -- Must have NO insurance coverage whatsoever (no Medicaid/ Medicare, etc.) -- The pt. MUST have a primary care doctor that directs their care regularly and follows them in the community   Med[facility]ist  [phone]   [employer]  [phone]    Agencies that provide inexpensive medical care: Organization         Address  Phone   Notes  [facility]  [phone]   [facility]    [phone]   [facility] [address] [place], [place] [address] [phone]   [facility] [address], [place] [phone]   [facility]    [phone]   [facility]    [phone]   [facility]  [address], [place] Phone:  [phone], Fax:  [phone] Hours of Operation:  9 am - 6 pm, M-F.  Also accepts Medicaid/Medicare and self-pay.  [facility] for [address], [address], [place] Phone: [phone], Fax: [phone]. Hours of Operation:  8:30 am - 5:30 pm, M-F.  Also accepts Medicaid and self-pay.  [facility] [address] [place] Phone: [phone]   [place], [place], [place] [phone], Ext. [phone] Mondays & Thursdays: 7-9 AM.  First 15 patients are seen on a first come, first serve basis.    [place] Providers:  Organization         Address  Phone   Notes  [facility] [address], [place] [phone] Also accepts self-pay patients.  [facility] [place] [address], [place]  [phone]   [place], [address], [place] [phone]   [facility] [address] [place] [phone]   Lucianne Lei [address], [place]   [phone] Only accepts [place] Access [place] patients after they have their name applied to their card.   Self-Pay (no insurance) in [facility]:  Organization         Address  Phone   Notes  Sickle Cell Patients, [facility] [place] [phone]   [facility] [place] [phone]   [facility] [place]  [place], [place], [place] [phone]   [facility]/Dr. Osei-Bonsu  [address], [place] or [address], [place] [phone] Phone number for both [place] and [place] locations is the same.  [facility] [address], [place] [phone]   [facility] [address], [place] or [address] [phone]   [facility] [address] [place] [phone], phone; [phone], fax Sees patients 1st and 3rd Saturday of every month.  Must not qualify for public or private insurance (i.e. Medicaid, Medicare, [place] Health Choice, Veterans' Benefits)  Household income should be no more than 200% of the poverty level The clinic cannot treat you if you are pregnant or think you are pregnant  Sexually transmitted diseases are not treated at the clinic.    Dental Care: Organization         Address  Phone  Notes  [facility] [place] [phone] Accepts children up to age 7 who are enrolled in [place] or [place]; pregnant women with a Medicaid card; and children who have applied for Medicaid or [place] Health Choice, but were declined, whose parents can pay a reduced fee at time of service.  [facility]  [address], [place] [phone] Accepts children up to age 22 who are enrolled in [place] or [place]; pregnant women with a Medicaid card; and children who have applied for Medicaid or [place] Health Choice, but were declined, whose parents can pay a reduced fee at time of service.  [place]  [place] [phone] Patients are seen by appointment only. Walk-ins  are not accepted. [place] will see patients 57 years of age and older. Monday - Tuesday (8am-5pm) Most Wednesdays (8:30-5pm) $30 per visit, cash only  [facility]  [address], [facility] [phone] Patients are seen by appointment only. Walk-ins are not accepted. [place] will see patients 68 years of age and older. One Wednesday Evening (Monthly: Volunteer Based).  $30 per visit, cash only  [place]  [phone] for adults; Children under age 80, call Graduate Pediatric Dentistry at [phone]. Children aged 24-14, please call [phone] to request a pediatric application.  Dental services are provided in all areas of dental care including fillings, crowns and bridges, complete and partial dentures, implants, gum treatment, root canals, and extractions. Preventive care is also provided. Treatment is provided to both adults and children. Patients are selected via a lottery and there is often a waiting list.   [facility] [address] [place]  [phone] [email or web address]   [facility] [address] [place], [place] [phone], Ext. [phone] Second and Fourth Thursday of each month, opens at 6:30 AM; Clinic ends at 9 AM.  Patients are seen on a first-come first-served basis, and a limited number are seen during each clinic.   [facility]  [address] Hillard Danker [place], [place] [phone]   Eligibility Requirements You must have lived in [place], [place], or [place] counties for at least the last three months.   You cannot be eligible for state or federal sponsored [employer], including [employer], [place], or [employer].   You generally cannot be eligible for healthcare insurance through your employer.    How to apply: Eligibility screenings are held every Tuesday and Wednesday afternoon from 1:00 pm until 4:00 pm. You do not need an appointment for the interview!  [facility] [address], [place], [place]   [place]  [place] Department  [place]  [phone]    Behavioral Health Resources in the Community: Intensive Outpatient Programs Organization         Address  Phone  Notes  [place]. [address], [place], [place] [phone]   [facility] Outpatient [address], [place], [place]   ADS: Alcohol & Drug Svcs [address] [place], [place]   [place] [address],  [place], [place] or [phone]   Substance Abuse Resources Organization         Address  Phone  Notes  Alcohol and Drug Services  [phone]   [place]  [phone]   [facility]   Chinita Pester  [phone]   Residential & Outpatient Substance Abuse Program  [phone]   Psychological Services Organization         Address  Phone  Notes  [facility] [place]   [place]  [phone]   [place] [address], [place] [phone] or [phone]    Mobile Crisis Teams Organization         Address  Phone  Notes  [facility], Mobile Crisis Care Unit  [phone]   [facility]  [address] [place], [place]   [facility] [address] [place] [phone]  Self-Help/Support Groups Organization         Address  Phone             Notes  [facility] [place] - variety of support groups  [place] Call for more information  Narcotics Anonymous (NA), [facility] [address], [employer] [place]  2 meetings at this location   Special educational needs teacher         Address  Phone  Notes  [facility] [place],    [place]  [phone]   [facility]  [address] [place] [address], [place], [place]   [place], [place] [phone] Admissions: 8am-3pm M-F  Incentives Substance [place] [address],    [place], [place] [phone]   [facility] [address] [place], [place], [place]   [facility] [address],  [place], [place]   [facility] - Intensive Outpatient [address], [place], [place]   [facility] ([place].,  [place], [place] [phone] or [phone]   [facility] (RTS) [address] [place], [place] Accepts Medicaid  [facility] [place] [address],  [place] [phone] Substance Abuse/Addiction Treatment   [facility] Organization         Address  Phone  Notes  [facility]  [phone]   Domenic Schwab, PhD [address] [place], [place]   [phone] or  [phone]   [place] [employer], [place] [phone]   [facility] [address], [place], [place] [phone] Insurance/Medicaid/sponsorship through [facility] and Families [address], [address] [place]                                    [place], [place] [phone] [place] [address], [place] [phone]    Dr. Adele Schilder  [phone]   [facility] Dept. 1) [address], [place] 2) [place] 3)  [place] [address], [place] [phone]  [phone]   [place] [phone] or [phone] (After Hours)

## 2013-11-23 NOTE — ED Provider Notes (Signed)
Medical screening examination/treatment/procedure(s) were performed by non-physician practitioner and as supervising physician I was immediately available for consultation/collaboration.   Linwood DibblesJon Naomi Castrogiovanni, MD 11/23/13 [phone]

## 2013-12-24 ENCOUNTER — Emergency Department (HOSPITAL_BASED_OUTPATIENT_CLINIC_OR_DEPARTMENT_OTHER): Payer: Self-pay

## 2013-12-24 ENCOUNTER — Encounter (HOSPITAL_BASED_OUTPATIENT_CLINIC_OR_DEPARTMENT_OTHER): Payer: Self-pay | Admitting: *Deleted

## 2013-12-24 ENCOUNTER — Emergency Department (HOSPITAL_BASED_OUTPATIENT_CLINIC_OR_DEPARTMENT_OTHER)
Admission: EM | Admit: 2013-12-24 | Discharge: 2013-12-24 | Disposition: A | Discharge status: Home / Self Care | Payer: Self-pay | Attending: Emergency Medicine | Admitting: Emergency Medicine

## 2013-12-24 DIAGNOSIS — R109 Unspecified abdominal pain: Secondary | ICD-10-CM

## 2013-12-24 DIAGNOSIS — M549 Dorsalgia, unspecified: Secondary | ICD-10-CM | POA: Insufficient documentation

## 2013-12-24 DIAGNOSIS — K5900 Constipation, unspecified: Secondary | ICD-10-CM | POA: Insufficient documentation

## 2013-12-24 DIAGNOSIS — R079 Chest pain, unspecified: Secondary | ICD-10-CM | POA: Insufficient documentation

## 2013-12-24 DIAGNOSIS — Z79899 Other long term (current) drug therapy: Secondary | ICD-10-CM | POA: Insufficient documentation

## 2013-12-24 DIAGNOSIS — G8929 Other chronic pain: Secondary | ICD-10-CM | POA: Insufficient documentation

## 2013-12-24 DIAGNOSIS — Z720 Tobacco use: Secondary | ICD-10-CM | POA: Insufficient documentation

## 2013-12-24 DIAGNOSIS — R1084 Generalized abdominal pain: Secondary | ICD-10-CM | POA: Insufficient documentation

## 2013-12-24 DIAGNOSIS — R531 Weakness: Secondary | ICD-10-CM | POA: Insufficient documentation

## 2013-12-24 DIAGNOSIS — K219 Gastro-esophageal reflux disease without esophagitis: Secondary | ICD-10-CM | POA: Insufficient documentation

## 2013-12-24 DIAGNOSIS — Z8659 Personal history of other mental and behavioral disorders: Secondary | ICD-10-CM | POA: Insufficient documentation

## 2013-12-24 DIAGNOSIS — Z9889 Other specified postprocedural states: Secondary | ICD-10-CM | POA: Insufficient documentation

## 2013-12-24 DIAGNOSIS — I10 Essential (primary) hypertension: Secondary | ICD-10-CM | POA: Insufficient documentation

## 2013-12-24 DIAGNOSIS — R197 Diarrhea, unspecified: Secondary | ICD-10-CM | POA: Insufficient documentation

## 2013-12-24 DIAGNOSIS — R42 Dizziness and giddiness: Secondary | ICD-10-CM | POA: Insufficient documentation

## 2013-12-24 LAB — CBC WITH DIFFERENTIAL/PLATELET
BASOS ABS: 0 10*3/uL (ref 0.0–0.1)
BASOS PCT: 1 % (ref 0–1)
EOS PCT: 2 % (ref 0–5)
Eosinophils Absolute: 0.1 10*3/uL (ref 0.0–0.7)
HCT: 45.9 % (ref 39.0–52.0)
Hemoglobin: 15.7 g/dL (ref 13.0–17.0)
Lymphocytes Relative: 39 % (ref 12–46)
Lymphs Abs: 2.4 10*3/uL (ref 0.7–4.0)
MCH: 28.3 pg (ref 26.0–34.0)
MCHC: 34.2 g/dL (ref 30.0–36.0)
MCV: 82.7 fL (ref 78.0–100.0)
MONO ABS: 0.5 10*3/uL (ref 0.1–1.0)
Monocytes Relative: 8 % (ref 3–12)
Neutro Abs: 3.1 10*3/uL (ref 1.7–7.7)
Neutrophils Relative %: 50 % (ref 43–77)
Platelets: 306 10*3/uL (ref 150–400)
RBC: 5.55 MIL/uL (ref 4.22–5.81)
RDW: 14.3 % (ref 11.5–15.5)
WBC: 6.1 10*3/uL (ref 4.0–10.5)

## 2013-12-24 LAB — URINE MICROSCOPIC-ADD ON

## 2013-12-24 LAB — URINALYSIS, ROUTINE W REFLEX MICROSCOPIC
Glucose, UA: NEGATIVE mg/dL
Hgb urine dipstick: NEGATIVE
KETONES UR: 15 mg/dL — AB
Nitrite: NEGATIVE
Protein, ur: NEGATIVE mg/dL
Specific Gravity, Urine: 1.029 (ref 1.005–1.030)
UROBILINOGEN UA: 1 mg/dL (ref 0.0–1.0)
pH: 6 (ref 5.0–8.0)

## 2013-12-24 LAB — COMPREHENSIVE METABOLIC PANEL
ALT: 29 U/L (ref 0–53)
AST: 21 U/L (ref 0–37)
Albumin: 3.7 g/dL (ref 3.5–5.2)
Alkaline Phosphatase: 49 U/L (ref 39–117)
Anion gap: 13 (ref 5–15)
BILIRUBIN TOTAL: 1.5 mg/dL — AB (ref 0.3–1.2)
BUN: 13 mg/dL (ref 6–23)
CO2: 27 mEq/L (ref 19–32)
CREATININE: 1.4 mg/dL — AB (ref 0.50–1.35)
Calcium: 9.8 mg/dL (ref 8.4–10.5)
Chloride: 99 mEq/L (ref 96–112)
GFR calc Af Amer: 68 mL/min — ABNORMAL LOW (ref >90)
GFR calc non Af Amer: 58 mL/min — ABNORMAL LOW (ref >90)
Glucose, Bld: 125 mg/dL — ABNORMAL HIGH (ref 70–99)
Potassium: 4.3 mEq/L (ref 3.7–5.3)
Sodium: 139 mEq/L (ref 137–147)
Total Protein: 7.6 g/dL (ref 6.0–8.3)

## 2013-12-24 LAB — LIPASE, BLOOD: LIPASE: 27 U/L (ref 11–59)

## 2013-12-24 MED ORDER — GI COCKTAIL ~~LOC~~
30.0000 mL | Freq: Once | ORAL | Status: AC
  Administered 2013-12-24: 30 mL via ORAL
  Filled 2013-12-24: qty 30

## 2013-12-24 MED ORDER — ALUM & MAG HYDROXIDE-SIMETH 500-450-40 MG/5ML PO SUSP: 10.0000 mL | Freq: Four times a day (QID) | ORAL | Status: DC | PRN

## 2013-12-24 MED ORDER — OMEPRAZOLE 20 MG PO CPDR: 20.0000 mg | DELAYED_RELEASE_CAPSULE | Freq: Two times a day (BID) | ORAL | Status: DC

## 2013-12-24 MED ORDER — ONDANSETRON 4 MG PO TBDP: ORAL_TABLET | ORAL | Status: DC

## 2013-12-24 MED ORDER — SODIUM CHLORIDE 0.9 % IV BOLUS (SEPSIS)
2000.0000 mL | Freq: Once | INTRAVENOUS | Status: AC
  Administered 2013-12-24: 2000 mL via INTRAVENOUS

## 2013-12-24 MED ORDER — ONDANSETRON HCL 4 MG/2ML IJ SOLN
4.0000 mg | Freq: Once | INTRAMUSCULAR | Status: AC
  Administered 2013-12-24: 4 mg via INTRAVENOUS
  Filled 2013-12-24: qty 2

## 2013-12-24 NOTE — ED Notes (Signed)
Pt reports vomiting x several days.

## 2013-12-24 NOTE — ED Provider Notes (Signed)
CSN: [id number]     Arrival date & time 12/24/13  1703 History  This chart was scribed for Mirian MoMatthew Allean Montfort, MD by Gwenyth Oberatherine Macek, ED Scribe. This patient was seen in room MH01/MH01 and the patient's care was started at 6:02 PM.    Chief Complaint  Patient presents with  . Emesis   The history is provided by the patient. No language interpreter was used.    HPI Comments: Lonnie [name] is a 48 y.o. male with a history of GERD who presents to the Emergency Department complaining of constant weakness and intermittent dizziness that started earlier today. Pt states daily vomiting that is orange in color and started 6-7 weeks ago, intermittent diarrhea, intermittent constipation, darker than normal stool, dysuria, abdominal pain and left back pain that radiates to his chest as associated symptoms. He states he has been trying to increase fluid intake with no relief. Pt notes that he has tolerated food intake and has been following BRAT diet as instructed. Pt was seen in the ED on 10/22, 10/23, 10/27 with the same symptoms. He had a CT scan on 10/27 and was diagnosed with gastritis. He has not followed up with a GI doctor because of financial issues. He denies taking Prilosec or Zantac for symptoms. Pt denies hematemesis and hematuria as associated symptoms.  Duration is chronically for months.  Timing constant.  Eating exacerbates his symptoms.  Past Medical History  Diagnosis Date  . GERD (gastroesophageal reflux disease)   . Depression   . Lumbago   . ETOH abuse   . Cocaine abuse   . Xanax use disorder, mild, abuse   . Marijuana abuse   . Schizophrenia   . Hypertension     pt reports that he has hx   Past Surgical History  Procedure Laterality Date  . Back surgery    . Neck surgery    . Esophagogastroduodenoscopy (egd) with propofol N/A 07/30/2013    Procedure: ESOPHAGOGASTRODUODENOSCOPY (EGD) WITH PROPOFOL;  Surgeon: Barrie FolkJohn C Hayes, MD;  Location: [facility];  Service: Endoscopy;   Laterality: N/A;   Family History  Problem Relation Age of Onset  . Diabetes Mother   . Hypertension Mother   . Hyperlipidemia Father   . Heart attack Neg Hx   . Sudden death Neg Hx    History  Substance Use Topics  . Smoking status: Current Every Day Smoker -- 0.50 packs/day for 15 years    Types: Cigarettes    Last Attempt to Quit: 08/08/2012  . Smokeless tobacco: Never Used  . Alcohol Use: Yes    Review of Systems  Cardiovascular: Positive for chest pain.  Gastrointestinal: Positive for vomiting, abdominal pain, diarrhea and constipation.  Genitourinary: Positive for dysuria. Negative for hematuria.  Musculoskeletal: Positive for back pain.  Neurological: Positive for dizziness and weakness.  All other systems reviewed and are negative.     Allergies  Ibuprofen and Morphine and related  Home Medications   Prior to Admission medications   Medication Sig Start Date End Date Taking? Authorizing Provider  aluminum & magnesium hydroxide-simethicone (MYLANTA) 500-450-40 MG/5ML suspension Take 10 mLs by mouth every 6 (six) hours as needed for indigestion. 12/24/13   Mirian MoMatthew Zackry Deines, MD  omeprazole (PRILOSEC) 20 MG capsule Take 1 capsule (20 mg total) by mouth 2 (two) times daily before a meal. 12/24/13   Mirian MoMatthew Mykal Batiz, MD  ondansetron (ZOFRAN ODT) 4 MG disintegrating tablet 4mg  ODT q4 hours prn nausea/vomit 12/24/13   Mirian MoMatthew Eleah Lahaie, MD   BP  120/69 mmHg  Pulse 80  Temp(Src) 98.2 F (36.8 C) (Oral)  Resp 18  Ht 5\' 11"  (1.803 m)  Wt 185 lb (83.915 kg)  BMI 25.81 kg/m2  SpO2 100% Physical Exam  Constitutional: He is oriented to person, place, and time. He appears well-developed and well-nourished.  HENT:  Head: Normocephalic and atraumatic.  Eyes: Conjunctivae and EOM are normal.  Neck: Normal range of motion. Neck supple.  Cardiovascular: Normal rate, regular rhythm and normal heart sounds.   Pulmonary/Chest: Effort normal and breath sounds normal. No respiratory  distress.  Abdominal: He exhibits no distension. There is generalized tenderness. There is no rebound and no guarding.  Musculoskeletal: Normal range of motion.  Neurological: He is alert and oriented to person, place, and time.  Skin: Skin is warm and dry.  Vitals reviewed.   ED Course  Procedures (including critical care time) DIAGNOSTIC STUDIES: Oxygen Saturation is 100% on RA, normal by my interpretation.    COORDINATION OF CARE: 6:07 PM Discussed treatment plan with pt which includes abdominal acute with chest x-ray and lab work. Pt agreed to plan.  Labs Review Labs Reviewed  COMPREHENSIVE METABOLIC PANEL - Abnormal; Notable for the following:    Glucose, Bld 125 (*)    Creatinine, Ser 1.40 (*)    Total Bilirubin 1.5 (*)    GFR calc non Af Amer 58 (*)    GFR calc Af Amer 68 (*)    All other components within normal limits  URINALYSIS, ROUTINE W REFLEX MICROSCOPIC - Abnormal; Notable for the following:    Color, Urine AMBER (*)    Bilirubin Urine SMALL (*)    Ketones, ur 15 (*)    Leukocytes, UA TRACE (*)    All other components within normal limits  URINE MICROSCOPIC-ADD ON - Abnormal; Notable for the following:    Bacteria, UA FEW (*)    All other components within normal limits  LIPASE, BLOOD  CBC WITH DIFFERENTIAL    Imaging Review Dg Abd Acute W/chest  12/24/2013   CLINICAL DATA:  Nausea, vomiting and intermittent diarrhea.  EXAM: ACUTE ABDOMEN SERIES (ABDOMEN 2 VIEW & CHEST 1 VIEW)  COMPARISON:  11/16/2013 and CT on 11/20/2013.  FINDINGS: There is no evidence of dilated bowel loops or free intraperitoneal air. No radiopaque calculi or other significant radiographic abnormality is seen.  Heart size and mediastinal contours are within normal limits. There is no evidence of pulmonary edema, consolidation, pneumothorax, nodule or pleural fluid.  IMPRESSION: Negative abdominal radiographs.  No acute cardiopulmonary disease.   Electronically Signed   By: Irish Lack  M.D.   On: 12/24/2013 19:44     EKG Interpretation None      MDM   Final diagnoses:  Abdominal pain, acute    48 y.o. male with pertinent PMH of chronic abd pain, diarrhea, and nausea presents with acute on chronic symptoms as described above.  Symptoms are mild and described as soreness. He has only a minimal amount of pain in his abdomen.  On arrival vital signs and physical exam as above. Patient has no focal tenderness of his abdomen.  He was given normal saline bolus, Zofran, GI cocktail with near resolution of symptoms. We discussed the importance of GI follow-up with the patient at length as these are chronic symptoms and he has not had follow-up. He has a mild elevation in his creatinine which is likely dehydration.  I will have him follow-up with his primary care doctor and with GI. He was  given a prescription for Zofran. Discharged home in stable condition.  1. Abdominal pain, acute           Mirian MoMatthew Zeffie Bickert, MD 12/24/13 2336

## 2013-12-24 NOTE — Discharge Instructions (Signed)

## 2014-04-01 ENCOUNTER — Emergency Department (HOSPITAL_BASED_OUTPATIENT_CLINIC_OR_DEPARTMENT_OTHER)
Admission: EM | Admit: 2014-04-01 | Discharge: 2014-04-01 | Disposition: A | Discharge status: Home / Self Care | Payer: Self-pay | Attending: Emergency Medicine | Admitting: Emergency Medicine

## 2014-04-01 ENCOUNTER — Emergency Department (HOSPITAL_BASED_OUTPATIENT_CLINIC_OR_DEPARTMENT_OTHER): Payer: Self-pay

## 2014-04-01 DIAGNOSIS — Z79899 Other long term (current) drug therapy: Secondary | ICD-10-CM | POA: Insufficient documentation

## 2014-04-01 DIAGNOSIS — Y998 Other external cause status: Secondary | ICD-10-CM | POA: Insufficient documentation

## 2014-04-01 DIAGNOSIS — Z720 Tobacco use: Secondary | ICD-10-CM | POA: Insufficient documentation

## 2014-04-01 DIAGNOSIS — Y9289 Other specified places as the place of occurrence of the external cause: Secondary | ICD-10-CM | POA: Insufficient documentation

## 2014-04-01 DIAGNOSIS — F329 Major depressive disorder, single episode, unspecified: Secondary | ICD-10-CM | POA: Insufficient documentation

## 2014-04-01 DIAGNOSIS — Y9302 Activity, running: Secondary | ICD-10-CM | POA: Insufficient documentation

## 2014-04-01 DIAGNOSIS — S9031XA Contusion of right foot, initial encounter: Secondary | ICD-10-CM | POA: Insufficient documentation

## 2014-04-01 DIAGNOSIS — W228XXA Striking against or struck by other objects, initial encounter: Secondary | ICD-10-CM | POA: Insufficient documentation

## 2014-04-01 DIAGNOSIS — F209 Schizophrenia, unspecified: Secondary | ICD-10-CM | POA: Insufficient documentation

## 2014-04-01 DIAGNOSIS — I10 Essential (primary) hypertension: Secondary | ICD-10-CM | POA: Insufficient documentation

## 2014-04-01 DIAGNOSIS — K219 Gastro-esophageal reflux disease without esophagitis: Secondary | ICD-10-CM | POA: Insufficient documentation

## 2014-04-01 MED ORDER — TRAMADOL HCL 50 MG PO TABS: 50.0000 mg | ORAL_TABLET | Freq: Four times a day (QID) | ORAL | Status: DC | PRN

## 2014-04-01 NOTE — ED Notes (Signed)
Right foot injury yesterday.  Pt states he kicked a stump accidentally.  Noted som swelling.

## 2014-04-01 NOTE — ED Provider Notes (Signed)
CSN: [id number]     Arrival date & time 04/01/14  1025 History   First MD Initiated Contact with Patient 04/01/14 1050     Chief Complaint  Patient presents with  . Foot Injury     (Consider location/radiation/quality/duration/timing/severity/associated sxs/prior Treatment) HPI Comments: Pt was running yesterday and kicked a stump. Pt c/o pain to the right great toe  Patient is a 49 y.o. male presenting with foot injury. The history is provided by the patient. No language interpreter was used.  Foot Injury Location:  Foot Time since incident:  1 day Injury: yes   Foot location:  L foot Pain details:    Quality:  Aching   Severity:  Moderate   Past Medical History  Diagnosis Date  . GERD (gastroesophageal reflux disease)   . Depression   . Lumbago   . ETOH abuse   . Cocaine abuse   . Xanax use disorder, mild, abuse   . Marijuana abuse   . Schizophrenia   . Hypertension     pt reports that he has hx   Past Surgical History  Procedure Laterality Date  . Back surgery    . Neck surgery    . Esophagogastroduodenoscopy (egd) with propofol N/A 07/30/2013    Procedure: ESOPHAGOGASTRODUODENOSCOPY (EGD) WITH PROPOFOL;  Surgeon: Barrie Folk, MD;  [facility];  Service: Endoscopy;  Laterality: N/A;   Family History  Problem Relation Age of Onset  . Diabetes Mother   . Hypertension Mother   . Hyperlipidemia Father   . Heart attack Neg Hx   . Sudden death Neg Hx    History  Substance Use Topics  . Smoking status: Current Every Day Smoker -- 0.50 packs/day for 15 years    Types: Cigarettes    Last Attempt to Quit: 08/08/2012  . Smokeless tobacco: Never Used  . Alcohol Use: Yes    Review of Systems  All other systems reviewed and are negative.     Allergies  Ibuprofen and Morphine and related  Home Medications   Prior to Admission medications   Medication Sig Start Date End Date Taking? Authorizing Provider  risperidone (RISPERDAL) 4 MG tablet Take 4  mg by mouth 2 (two) times daily.   Yes Historical Provider, MD  aluminum & magnesium hydroxide-simethicone (MYLANTA) 500-450-40 MG/5ML suspension Take 10 mLs by mouth every 6 (six) hours as needed for indigestion. 12/24/13   Noel Gerold, MD  omeprazole (PRILOSEC) 20 MG capsule Take 1 capsule (20 mg total) by mouth 2 (two) times daily before a meal. 12/24/13   Noel Gerold, MD  ondansetron (ZOFRAN ODT) 4 MG disintegrating tablet  ODT q4 hours prn nausea/vomit 12/24/13   Noel Gerold, MD   BP 101/65 mmHg  Pulse 78  Temp(Src) 98 F (36.7 C) (Oral)  Resp 20  Ht  (1.753 m)  Wt 190 lb (86.183 kg)  BMI 28.05 kg/m2  SpO2 100% Physical Exam  Constitutional: He is oriented to person, place, and time. He appears well-developed and well-nourished.  Cardiovascular: Normal rate and regular rhythm.   Pulmonary/Chest: Effort normal and breath sounds normal.  Musculoskeletal: Normal range of motion.  Swelling noted to the right great toe  Neurological: He is alert and oriented to person, place, and time.  Skin: Skin is warm.  Nursing note and vitals reviewed.   ED Course  Procedures (including critical care time) Labs Review Labs Reviewed - No data to display  Imaging Review Dg Foot Complete Right  04/01/2014  CLINICAL DATA:  Medial foot pain post injury yesterday  EXAM: RIGHT FOOT COMPLETE - 3+ VIEW  COMPARISON:  03/30/2014  FINDINGS: Three views of the right foot submitted. No acute fracture or subluxation. Degenerative changes first metatarsal phalangeal joint. Dorsal spurring of navicular.  IMPRESSION: No acute fracture or subluxation. Degenerative changes first metatarsal phalangeal joint.   Electronically Signed   By: Natasha MeadLiviu  Pop M.D.   On: 04/01/2014 11:07     EKG Interpretation None      MDM   Final diagnoses:  Foot contusion, right, initial encounter    No acute bony abnormality noted. Will send home with zofran    Teressa LowerVrinda Tovah Slavick, NP 04/01/14 1236  Geoffery Lyonsouglas Delo,  MD 04/04/14 [phone]

## 2014-04-01 NOTE — Discharge Instructions (Signed)
Contusion °A contusion is a deep bruise. Contusions happen when an injury causes bleeding under the skin. Signs of bruising include pain, puffiness (swelling), and discolored skin. The contusion may turn blue, purple, or yellow. °HOME CARE  °· Put ice on the injured area. °¨ Put ice in a plastic bag. °¨ Place a towel between your skin and the bag. °¨ Leave the ice on for 15-20 minutes, 03-04 times a day. °· Only take medicine as told by your doctor. °· Rest the injured area. °· If possible, raise (elevate) the injured area to lessen puffiness. °GET HELP RIGHT AWAY IF:  °· You have more bruising or puffiness. °· You have pain that is getting worse. °· Your puffiness or pain is not helped by medicine. °MAKE SURE YOU:  °· Understand these instructions. °· Will watch your condition. °· Will get help right away if you are not doing well or get worse. °Document Released: 06/30/2007 Document Revised: 04/05/2011 Document Reviewed: 11/16/2010 °ExitCare® Patient Information ©2015 [employer]. This information is not intended to replace advice given to you by your health care provider. Make sure you discuss any questions you have with your health care provider. ° °

## 2014-05-17 ENCOUNTER — Emergency Department (HOSPITAL_COMMUNITY)
Admission: EM | Admit: 2014-05-17 | Discharge: 2014-05-17 | Disposition: A | Discharge status: Home / Self Care | Payer: Self-pay | Attending: Emergency Medicine | Admitting: Emergency Medicine

## 2014-05-17 ENCOUNTER — Encounter (HOSPITAL_COMMUNITY): Payer: Self-pay | Admitting: Emergency Medicine

## 2014-05-17 DIAGNOSIS — K4091 Unilateral inguinal hernia, without obstruction or gangrene, recurrent: Secondary | ICD-10-CM | POA: Insufficient documentation

## 2014-05-17 DIAGNOSIS — Z8739 Personal history of other diseases of the musculoskeletal system and connective tissue: Secondary | ICD-10-CM | POA: Insufficient documentation

## 2014-05-17 DIAGNOSIS — I10 Essential (primary) hypertension: Secondary | ICD-10-CM | POA: Insufficient documentation

## 2014-05-17 DIAGNOSIS — Z79899 Other long term (current) drug therapy: Secondary | ICD-10-CM | POA: Insufficient documentation

## 2014-05-17 DIAGNOSIS — Z720 Tobacco use: Secondary | ICD-10-CM | POA: Insufficient documentation

## 2014-05-17 DIAGNOSIS — K219 Gastro-esophageal reflux disease without esophagitis: Secondary | ICD-10-CM | POA: Insufficient documentation

## 2014-05-17 DIAGNOSIS — F329 Major depressive disorder, single episode, unspecified: Secondary | ICD-10-CM | POA: Insufficient documentation

## 2014-05-17 MED ORDER — POLYETHYLENE GLYCOL 3350 17 G PO PACK: 17.0000 g | PACK | Freq: Every day | ORAL | Status: DC

## 2014-05-17 MED ORDER — HYDROCODONE-ACETAMINOPHEN 5-325 MG PO TABS: 1.0000 | ORAL_TABLET | ORAL | Status: DC | PRN

## 2014-05-17 MED ORDER — FENTANYL CITRATE (PF) 100 MCG/2ML IJ SOLN
50.0000 ug | Freq: Once | INTRAMUSCULAR | Status: AC
  Administered 2014-05-17: 50 ug via INTRAVENOUS
  Filled 2014-05-17: qty 2

## 2014-05-17 NOTE — Discharge Instructions (Signed)

## 2014-05-17 NOTE — ED Notes (Signed)
Per EMS: right inguinal hernia that has existed for 3 years, since this am pt has not been able to reduce hernia and pain was 10/10 before administration of 100 mcg of fentanyl by EMS intravenously.  Pain is decreased but hernia is not reduced.

## 2014-05-18 NOTE — ED Provider Notes (Signed)
CSN: [id number]     Arrival date & time 05/17/14  1933 History   First MD Initiated Contact with Patient 05/17/14 1937     Chief Complaint  Patient presents with  . Inguinal Hernia     (Consider location/radiation/quality/duration/timing/severity/associated sxs/prior Treatment) HPI   This is a 49 year old male, with history of EtOH, cocaine, Xanax, marijuana abuse, schizophrenia, three-year history of right inguinal hernia, presenting today with pain associated with right inguinal hernia. He states that it was more difficult to get in today than normal. His pain has resolved now that the hernia is in. It's located along the right inguinal area. It was sharp. It was alleviated with 100 g of fentanyl administered by paramedics. Nonradiating. Negative for nausea, vomiting, diarrhea, constipation, hematochezia, melena, dysuria, hematuria. Patient has been passing gas all day.  Past Medical History  Diagnosis Date  . GERD (gastroesophageal reflux disease)   . Depression   . Lumbago   . ETOH abuse   . Cocaine abuse   . Xanax use disorder, mild, abuse   . Marijuana abuse   . Schizophrenia   . Hypertension     pt reports that he has hx   Past Surgical History  Procedure Laterality Date  . Back surgery    . Neck surgery    . Esophagogastroduodenoscopy (egd) with propofol N/A 07/30/2013    Procedure: ESOPHAGOGASTRODUODENOSCOPY (EGD) WITH PROPOFOL;  Surgeon: Barrie Folk, MD;  Location: [facility];  Service: Endoscopy;  Laterality: N/A;   Family History  Problem Relation Age of Onset  . Diabetes Mother   . Hypertension Mother   . Hyperlipidemia Father   . Heart attack Neg Hx   . Sudden death Neg Hx    History  Substance Use Topics  . Smoking status: Current Every Day Smoker -- 0.50 packs/day for 15 years    Types: Cigarettes    Last Attempt to Quit: 08/08/2012  . Smokeless tobacco: Never Used  . Alcohol Use: Yes    Review of Systems  Constitutional: Negative for fever and  chills.  HENT: Negative for facial swelling.   Eyes: Negative for pain and visual disturbance.  Respiratory: Negative for chest tightness and shortness of breath.   Cardiovascular: Negative for chest pain.  Gastrointestinal: Positive for abdominal pain. Negative for nausea and vomiting.  Genitourinary: Negative for dysuria.  Musculoskeletal: Negative for myalgias and arthralgias.  Neurological: Negative for headaches.  Psychiatric/Behavioral: Negative for behavioral problems.      Allergies  Ibuprofen and Morphine and related  Home Medications   Prior to Admission medications   Medication Sig Start Date End Date Taking? Authorizing Provider  risperidone (RISPERDAL) 4 MG tablet Take 4 mg by mouth 2 (two) times daily.   Yes Historical Provider, MD  aluminum & magnesium hydroxide-simethicone (MYLANTA) 500-450-40 MG/5ML suspension Take 10 mLs by mouth every 6 (six) hours as needed for indigestion. Patient not taking: Reported on 05/17/2014 12/24/13   Mirian Mo, MD  HYDROcodone-acetaminophen (NORCO/VICODIN) 5-325 MG per tablet Take 1 tablet by mouth every 4 (four) hours as needed. 05/17/14   Loma Boston, MD  omeprazole (PRILOSEC) 20 MG capsule Take 1 capsule (20 mg total) by mouth 2 (two) times daily before a meal. Patient not taking: Reported on 05/17/2014 12/24/13   Mirian Mo, MD  ondansetron (ZOFRAN ODT) 4 MG disintegrating tablet  ODT q4 hours prn nausea/vomit Patient not taking: Reported on 05/17/2014 12/24/13   Mirian Mo, MD  polyethylene glycol [facility]) packet Take 17 g by mouth daily.  05/17/14   Loma Boston, MD  traMADol (ULTRAM) 50 MG tablet Take 1 tablet (50 mg total) by mouth every 6 (six) hours as needed. Patient not taking: Reported on 05/17/2014 04/01/14   Teressa Lower, NP   BP 106/68 mmHg  Pulse 74  Temp(Src) 98.3 F (36.8 C) (Oral)  Resp 16  Ht  (1.753 m)  Wt 180 lb (81.647 kg)  BMI 26.57 kg/m2  SpO2 99% Physical Exam  Constitutional:  He is oriented to person, place, and time. He appears well-developed and well-nourished. No distress.  HENT:  Head: Normocephalic and atraumatic.  Mouth/Throat: No oropharyngeal exudate.  Eyes: Conjunctivae are normal. Pupils are equal, round, and reactive to light. No scleral icterus.  Neck: Normal range of motion. No tracheal deviation present. No thyromegaly present.  Cardiovascular: Normal rate, regular rhythm and normal heart sounds.  Exam reveals no gallop and no friction rub.   No murmur heard. Pulmonary/Chest: Effort normal and breath sounds normal. No stridor. No respiratory distress. He has no wheezes. He has no rales. He exhibits no tenderness.  Abdominal: Soft. He exhibits no distension and no mass. There is tenderness (right inguinal area). There is no rebound and no guarding.  Initially, no obvious mass appreciated to the right inguinal area. On palpation, there is a very tiny mass, which is easily reducible. After this reduction, patient states that he feels absolutely no pain whatsoever.  Musculoskeletal: Normal range of motion. He exhibits no edema.  Neurological: He is alert and oriented to person, place, and time.  Skin: Skin is warm and dry. He is not diaphoretic.    ED Course  Hernia reduction Date/Time: 05/17/2014 11:00 PM Performed by: Loma Boston Authorized by: Loma Boston Consent: Verbal consent obtained. Risks and benefits: risks, benefits and alternatives were discussed Consent given by: patient Patient understanding: patient states understanding of the procedure being performed Patient consent: the patient's understanding of the procedure matches consent given Procedure consent: procedure consent matches procedure scheduled Relevant documents: relevant documents present and verified Imaging studies: imaging studies available Patient identity confirmed: verbally with patient Time out: Immediately prior to procedure a "time out" was called to verify  the correct patient, procedure, equipment, support staff and site/side marked as required. Local anesthesia used: no Patient sedated: no Patient tolerance: Patient tolerated the procedure well with no immediate complications Comments: Light pressure was applied to the right inguinal area, with successful reduction. Patient is a symptomatically at this time. Negative for palpitations.   (including critical care time)  MDM   Final diagnoses:  Unilateral recurrent inguinal hernia without obstruction or gangrene    This is a 49 year old male, with history of EtOH, cocaine, Xanax, marijuana abuse, schizophrenia, three-year history of right inguinal hernia, presenting today with pain associated with right inguinal hernia. He states that it was more difficult to get in today than normal. His pain has resolved now that the hernia is in. It's located along the right inguinal area. It was sharp. It was alleviated with 100 g of fentanyl administered by paramedics. Nonradiating. Negative for nausea, vomiting, diarrhea, constipation, hematochezia, melena, dysuria, hematuria. Patient has been passing gas all day.  On examination, vital signs are stable. Cardiovascular, respiratory exams are within normal limits. Abdominal exam is within normal limits, with the exception of very small right inguinal mass on palpation, completely reduced with very minimal pressure. Patient is completely a symptomatic at this time. Negative for concern for incarceration, strangulation, obstruction. He states he RD has a belt to  wear for this hernia. I have given him return precautions. I have given him general instructions, such as stool softeners, avoidance of heavy lifting. I have given them instructions on follow-up with general surgery clinic.  Pt stable for discharge, FU.  All questions answered.  Return precautions given.  I have discussed case and care has been guided by my attending physician, Dr. Jeraldine LootsLockwood.  Loma BostonStirling  Catalino Plascencia, MD 05/18/14 [id number]  Gerhard Munchobert Lockwood, MD 05/19/14 [id number]

## 2014-06-06 ENCOUNTER — Ambulatory Visit: Payer: Self-pay | Admitting: General Surgery

## 2014-06-06 NOTE — H&P (Signed)
History of Present Illness Lonnie [name](Lonnie Blakley MD; 06/06/2014 9:49 AM) Patient words: New Patient- Hernia.  The patient is a 49 year old male who presents with an inguinal hernia. 49 year old male [facility]. The patient states that the hernia is been there for approximately 2 years. He states been causing more pain. He's had no symptoms of strangulation. According the patient's medical chart the patient has a history of alcohol, cocaine, Xanax, marijuana abuse. The patient states he has a history of narcotic pain medication abuse as well.   Other Problems Lonnie [name](Lonnie Sakowski, LPN; 1/61/09605/12/2014 4:549:48 AM) Back Pain Cerebrovascular Accident High blood pressure  Past Surgical History Lonnie [name](Lonnie Sakowski, LPN; 0/98/11915/12/2014 4:789:48 AM) Spinal Surgery - Lower Back  Diagnostic Studies History Lonnie [name](Lonnie Sakowski, LPN; 2/95/62135/12/2014 0:869:48 AM) Colonoscopy never  Allergies Lonnie [name](Lonnie Sakowski, LPN; 5/78/46965/12/2014 2:959:33 AM) Methadone *CHEMICALS* Ibuprofen *ANALGESICS - ANTI-INFLAMMATORY*  Medication History Lonnie [name](Lonnie Sakowski, LPN; 2/84/13245/12/2014 4:019:33 AM) No Current Medications Medications Reconciled  Social History Lonnie [name](Lonnie Sakowski, LPN; 0/27/25365/12/2014 6:449:48 AM) Alcohol use Recently quit alcohol use. Caffeine use Coffee. Illicit drug use Recently quit drug use. Tobacco use Current some day smoker.  Family History Lonnie [name](Lonnie Sakowski, LPN; 0/34/74255/12/2014 9:569:48 AM) Family history unknown First Degree Relatives  Review of Systems Lonnie [name]([facility] L[facility]; 3/87/56435/12/2014 3:299:48 AM) General Present- Night Sweats. Not Present- Appetite Loss, Chills, Fatigue, Fever, Weight Gain and Weight Loss. Skin Not Present- Change in Wart/Mole, Dryness, Hives, Jaundice, New Lesions, Non-Healing Wounds, Rash and Ulcer. HEENT Present- Yellow Eyes. Not Present- Earache, Hearing Loss, Hoarseness, Nose Bleed, Oral Ulcers, Ringing in the Ears, Seasonal Allergies, Sinus Pain, Sore Throat, Visual Disturbances and Wears  glasses/contact lenses. Respiratory Not Present- Bloody sputum, Chronic Cough, Difficulty Breathing, Snoring and Wheezing. Breast Not Present- Breast Mass, Breast Pain, Nipple Discharge and Skin Changes. Cardiovascular Not Present- Chest Pain, Difficulty Breathing Lying Down, Leg Cramps, Palpitations, Rapid Heart Rate, Shortness of Breath and Swelling of Extremities. Gastrointestinal Not Present- Abdominal Pain, Bloating, Bloody Stool, Change in Bowel Habits, Chronic diarrhea, Constipation, Difficulty Swallowing, Excessive gas, Gets full quickly at meals, Hemorrhoids, Indigestion, Nausea, Rectal Pain and Vomiting. Male Genitourinary Present- Frequency. Not Present- Blood in Urine, Change in Urinary Stream, Impotence, Nocturia, Painful Urination, Urgency and Urine Leakage. Musculoskeletal Not Present- Back Pain, Joint Pain, Joint Stiffness, Muscle Pain, Muscle Weakness and Swelling of Extremities. Neurological Not Present- Decreased Memory, Fainting, Headaches, Numbness, Seizures, Tingling, Tremor, Trouble walking and Weakness. Psychiatric Not Present- Anxiety, Bipolar, Change in Sleep Pattern, Depression, Fearful and Frequent crying. Endocrine Not Present- Cold Intolerance, Excessive Hunger, Hair Changes, Heat Intolerance, Hot flashes and New Diabetes. Hematology Not Present- Easy Bruising, Excessive bleeding, Gland problems, HIV and Persistent Infections.   Vitals Lonnie [name]([facility] LPN; 5/18/84165/12/2014 6:069:32 AM) 06/06/2014 9:32 AM Weight: 182.38 lb Height: 71in Body Surface Area: 2.04 m Body Mass Index: 25.44 kg/m Pulse: 66 (Regular)  BP: 100/60 (Sitting, Left Arm, Standard)    Physical Exam Lonnie [name](Lonnie Ezzell, MD; 06/06/2014 9:49 00) General Mental Status-Alert. General Appearance-Consistent with stated age. Hydration-Well hydrated. Voice-Normal.  Head and Neck Head-normocephalic, atraumatic with no lesions or palpable masses. Trachea-midline. Thyroid Gland Characteristics  - normal size and consistency.  Chest and Lung Exam Chest and lung exam reveals -quiet, even and easy respiratory effort with no use of accessory muscles and on auscultation, normal breath sounds, no adventitious sounds and normal vocal resonance. Inspection Chest Wall - Normal. Back - normal.  Cardiovascular Cardiovascular examination reveals -normal heart sounds, regular rate and rhythm  with no murmurs and normal pedal pulses bilaterally.  Abdomen Inspection Skin - Scar - no surgical scars. Hernias - Inguinal hernia - Right - Reducible. Palpation/Percussion Normal exam - Soft, Non Tender, No Rebound tenderness, No Rigidity (guarding) and No hepatosplenomegaly. Auscultation Normal exam - Bowel sounds normal.    Assessment & Plan Lonnie [name](Lonnie Nida MD; 06/06/2014 9:51 AM) RIGHT INGUINAL HERNIA (550.90  K40.90) Impression: 49 year old male with a right inguinal hernia  1. The patient like to proceed to the operating for a laparoscopic right inguinal hernia repair with mesh 2. Secondary to the patient's previous drug history the patient will require a drug screen on the day of surgery to be sure there is no illicit drugs on board. 3. All risks and benefits were discussed with the patient to generally include, but not limited to: infection, bleeding, damage to surrounding structures, acute and chronic nerve pain, and recurrence. Alternatives were offered and described. All questions were answered and the patient voiced understanding of the procedure and wishes to proceed at this point with hernia repair.

## 2014-06-18 ENCOUNTER — Emergency Department (HOSPITAL_COMMUNITY)
Admission: EM | Admit: 2014-06-18 | Discharge: 2014-06-18 | Disposition: A | Discharge status: Home / Self Care | Payer: Self-pay | Attending: Emergency Medicine | Admitting: Emergency Medicine

## 2014-06-18 ENCOUNTER — Encounter (HOSPITAL_COMMUNITY): Payer: Self-pay | Admitting: Cardiology

## 2014-06-18 DIAGNOSIS — Z720 Tobacco use: Secondary | ICD-10-CM | POA: Insufficient documentation

## 2014-06-18 DIAGNOSIS — K219 Gastro-esophageal reflux disease without esophagitis: Secondary | ICD-10-CM | POA: Insufficient documentation

## 2014-06-18 DIAGNOSIS — F209 Schizophrenia, unspecified: Secondary | ICD-10-CM | POA: Insufficient documentation

## 2014-06-18 DIAGNOSIS — Z79899 Other long term (current) drug therapy: Secondary | ICD-10-CM | POA: Insufficient documentation

## 2014-06-18 DIAGNOSIS — F329 Major depressive disorder, single episode, unspecified: Secondary | ICD-10-CM | POA: Insufficient documentation

## 2014-06-18 DIAGNOSIS — I10 Essential (primary) hypertension: Secondary | ICD-10-CM | POA: Insufficient documentation

## 2014-06-18 DIAGNOSIS — K4090 Unilateral inguinal hernia, without obstruction or gangrene, not specified as recurrent: Secondary | ICD-10-CM | POA: Insufficient documentation

## 2014-06-18 LAB — CBC WITH DIFFERENTIAL/PLATELET
Basophils Absolute: 0 10*3/uL (ref 0.0–0.1)
Basophils Relative: 1 % (ref 0–1)
EOS ABS: 0.3 10*3/uL (ref 0.0–0.7)
EOS PCT: 6 % — AB (ref 0–5)
HEMATOCRIT: 42.7 % (ref 39.0–52.0)
Hemoglobin: 14.2 g/dL (ref 13.0–17.0)
LYMPHS PCT: 52 % — AB (ref 12–46)
Lymphs Abs: 2.6 10*3/uL (ref 0.7–4.0)
MCH: 28.1 pg (ref 26.0–34.0)
MCHC: 33.3 g/dL (ref 30.0–36.0)
MCV: 84.6 fL (ref 78.0–100.0)
MONO ABS: 0.3 10*3/uL (ref 0.1–1.0)
MONOS PCT: 6 % (ref 3–12)
NEUTROS PCT: 35 % — AB (ref 43–77)
Neutro Abs: 1.7 10*3/uL (ref 1.7–7.7)
Platelets: 305 10*3/uL (ref 150–400)
RBC: 5.05 MIL/uL (ref 4.22–5.81)
RDW: 14.7 % (ref 11.5–15.5)
WBC: 5 10*3/uL (ref 4.0–10.5)

## 2014-06-18 LAB — COMPREHENSIVE METABOLIC PANEL
ALK PHOS: 52 U/L (ref 38–126)
ALT: 28 U/L (ref 17–63)
ANION GAP: 10 (ref 5–15)
AST: 25 U/L (ref 15–41)
Albumin: 3.6 g/dL (ref 3.5–5.0)
BUN: 7 mg/dL (ref 6–20)
CHLORIDE: 105 mmol/L (ref 101–111)
CO2: 25 mmol/L (ref 22–32)
Calcium: 8.7 mg/dL — ABNORMAL LOW (ref 8.9–10.3)
Creatinine, Ser: 1.25 mg/dL — ABNORMAL HIGH (ref 0.61–1.24)
GFR calc Af Amer: >60 mL/min (ref >60)
GFR calc non Af Amer: >60 mL/min (ref >60)
Glucose, Bld: 95 mg/dL (ref 65–99)
POTASSIUM: 3.9 mmol/L (ref 3.5–5.1)
SODIUM: 140 mmol/L (ref 135–145)
Total Bilirubin: 1 mg/dL (ref 0.3–1.2)
Total Protein: 6.4 g/dL — ABNORMAL LOW (ref 6.5–8.1)

## 2014-06-18 LAB — URINALYSIS, ROUTINE W REFLEX MICROSCOPIC
Bilirubin Urine: NEGATIVE
GLUCOSE, UA: NEGATIVE mg/dL
Hgb urine dipstick: NEGATIVE
KETONES UR: NEGATIVE mg/dL
LEUKOCYTES UA: NEGATIVE
Nitrite: NEGATIVE
Protein, ur: NEGATIVE mg/dL
Specific Gravity, Urine: 1.026 (ref 1.005–1.030)
Urobilinogen, UA: 0.2 mg/dL (ref 0.0–1.0)
pH: 5.5 (ref 5.0–8.0)

## 2014-06-18 LAB — I-STAT CG4 LACTIC ACID, ED: LACTIC ACID, VENOUS: 0.67 mmol/L (ref 0.5–2.0)

## 2014-06-18 MED ORDER — MELOXICAM 7.5 MG PO TABS: 15.0000 mg | ORAL_TABLET | Freq: Every day | ORAL | Status: DC

## 2014-06-18 MED ORDER — OXYCODONE-ACETAMINOPHEN 5-325 MG PO TABS
2.0000 | ORAL_TABLET | Freq: Once | ORAL | Status: AC
  Administered 2014-06-18: 2 via ORAL
  Filled 2014-06-18: qty 2

## 2014-06-18 NOTE — Discharge Instructions (Signed)
Please call your doctor for a followup appointment within 24-48 hours. When you talk to your doctor please let them know that you were seen in the emergency department and have them acquire all of your records so that they can discuss the findings with you and formulate a treatment plan to fully care for your new and ongoing problems. ° °

## 2014-06-18 NOTE — ED Provider Notes (Signed)
CSN: [id number]     Arrival date & time 06/18/14  1225 History   First MD Initiated Contact with Patient 06/18/14 1639     Chief Complaint  Patient presents with  . Groin Pain     (Consider location/radiation/quality/duration/timing/severity/associated sxs/prior Treatment) HPI Comments: The patient is a 49 year old male, he has a history of a right inguinal hernia which has been present for approximately 2 years, he has been seen by medical providers multiple times including a general surgeon, he states that he was told that he probably needed surgery but this could be done electively, he has had difficulty raising funds to get this done and has no medical insurance other than an orange card. He states that last night, he had a recurrent right inguinal hernia (he states that he has this every single day and it is always reducible, it even comes out when he stands up). He was unable to push this back in, he has been having a burning sensation in that area most of the night and has vomited several times. He states that he called the general surgeon and was told to come to the hospital for evaluation. He denies fevers chills diarrhea constipation, has had no recent heavy lifting or straining, he did have an episode of hematuria last night prior to this happening. He denies any recurrent hematuria since that time. He has no back pain chest pain shortness of breath or headache  Patient is a 49 y.o. male presenting with groin pain. The history is provided by the patient.  Groin Pain    Past Medical History  Diagnosis Date  . GERD (gastroesophageal reflux disease)   . Depression   . Lumbago   . ETOH abuse   . Cocaine abuse   . Xanax use disorder, mild, abuse   . Marijuana abuse   . Schizophrenia   . Hypertension     pt reports that he has hx   Past Surgical History  Procedure Laterality Date  . Back surgery    . Neck surgery    . Esophagogastroduodenoscopy (egd) with propofol N/A 07/30/2013     Procedure: ESOPHAGOGASTRODUODENOSCOPY (EGD) WITH PROPOFOL;  Surgeon: Barrie FolkJohn C Hayes, MD;  Location: [facility];  Service: Endoscopy;  Laterality: N/A;   Family History  Problem Relation Age of Onset  . Diabetes Mother   . Hypertension Mother   . Hyperlipidemia Father   . Heart attack Neg Hx   . Sudden death Neg Hx    History  Substance Use Topics  . Smoking status: Current Every Day Smoker -- 0.50 packs/day for 15 years    Types: Cigarettes    Last Attempt to Quit: 08/08/2012  . Smokeless tobacco: Never Used  . Alcohol Use: Yes    Review of Systems  All other systems reviewed and are negative.     Allergies  Ibuprofen and Morphine and related  Home Medications   Prior to Admission medications   Medication Sig Start Date End Date Taking? Authorizing Provider  HYDROcodone-acetaminophen (NORCO/VICODIN) 5-325 MG per tablet Take 1 tablet by mouth every 4 (four) hours as needed. 05/17/14  Yes Loma BostonStirling Harper, MD  polyethylene glycol [facility](MIRALAX / GLYCOLAX) packet Take 17 g by mouth daily as needed for mild constipation.   Yes Historical Provider, MD  polyethylene glycol (MIRALAX) packet Take 17 g by mouth daily. 05/17/14  Yes Loma BostonStirling Harper, MD  risperidone (RISPERDAL) 4 MG tablet Take 4 mg by mouth 2 (two) times daily.   Yes Historical Provider,  MD  aluminum & magnesium hydroxide-simethicone (MYLANTA) 500-450-40 MG/5ML suspension Take 10 mLs by mouth every 6 (six) hours as needed for indigestion. Patient not taking: Reported on 05/17/2014 12/24/13   Mirian Mo, MD  omeprazole (PRILOSEC) 20 MG capsule Take 1 capsule (20 mg total) by mouth 2 (two) times daily before a meal. Patient not taking: Reported on 05/17/2014 12/24/13   Mirian Mo, MD  ondansetron (ZOFRAN ODT) 4 MG disintegrating tablet  ODT q4 hours prn nausea/vomit Patient not taking: Reported on 05/17/2014 12/24/13   Mirian Mo, MD  traMADol (ULTRAM) 50 MG tablet Take 1 tablet (50 mg total) by mouth every 6  (six) hours as needed. Patient not taking: Reported on 05/17/2014 04/01/14   Teressa Lower, NP   BP 113/75 mmHg  Pulse 60  Temp(Src) 98.3 F (36.8 C) (Oral)  Resp 16  Wt 180 lb (81.647 kg)  SpO2 100% Physical Exam  Constitutional: He appears well-developed and well-nourished. No distress.  HENT:  Head: Normocephalic and atraumatic.  Mouth/Throat: Oropharynx is clear and moist. No oropharyngeal exudate.  Eyes: Conjunctivae and EOM are normal. Pupils are equal, round, and reactive to light. Right eye exhibits no discharge. Left eye exhibits no discharge. No scleral icterus.  Neck: Normal range of motion. Neck supple. No JVD present. No thyromegaly present.  Cardiovascular: Normal rate, regular rhythm, normal heart sounds and intact distal pulses.  Exam reveals no gallop and no friction rub.   No murmur heard. Pulmonary/Chest: Effort normal and breath sounds normal. No respiratory distress. He has no wheezes. He has no rales.  Abdominal: Soft. Bowel sounds are normal. He exhibits no distension and no mass. There is no tenderness.  Genitourinary:  Right inguinal hernia present, this is easily reducible with placing the present patient in Trendelenburg position, no assistance needed other than Trendelenburg position. Normal-appearing testicles and penis after reduction of hernia.  Musculoskeletal: Normal range of motion. He exhibits no edema or tenderness.  Lymphadenopathy:    He has no cervical adenopathy.  Neurological: He is alert. Coordination normal.  Skin: Skin is warm and dry. No rash noted. No erythema.  Psychiatric: He has a normal mood and affect. His behavior is normal.  Nursing note and vitals reviewed.   ED Course  Procedures (including critical care time) Labs Review Labs Reviewed  CBC WITH DIFFERENTIAL/PLATELET - Abnormal; Notable for the following:    Neutrophils Relative % 35 (*)    Lymphocytes Relative 52 (*)    Eosinophils Relative 6 (*)    All other components  within normal limits  COMPREHENSIVE METABOLIC PANEL - Abnormal; Notable for the following:    Creatinine, Ser 1.25 (*)    Calcium 8.7 (*)    Total Protein 6.4 (*)    All other components within normal limits  URINALYSIS, ROUTINE W REFLEX MICROSCOPIC  I-STAT CG4 LACTIC ACID, ED    Imaging Review No results found.    MDM   Final diagnoses:  Inguinal hernia, right    The patient has a benign abdomen, vital signs are normal, labs show no leukocytosis, creatinine is 1.2, we'll check lactic acid, given pain medication, consult with general surgery as according to the patient they recommended he come here. We'll verify and perform a plan for the patient.  Lactic neg  UA neg  D/w Dr. Magnus Ivan - agrees with f/u in office - has orange card and state he can get surgeyr with that.  Meds given in ED:  Medications  oxyCODONE-acetaminophen (PERCOCET/ROXICET) 5-325 MG per tablet  2 tablet (2 tablets Oral Given 06/18/14 1717)    New Prescriptions   No medications on file      Eber Hong, MD 06/18/14 [phone]

## 2014-06-18 NOTE — ED Notes (Signed)
Pt reports he has a hernia to the right groin. States he feels like the area ruptured and he has noticed blood in his urine. Reports a burning sensation to the abd and vomiting.

## 2014-07-28 ENCOUNTER — Emergency Department (HOSPITAL_COMMUNITY)
Admission: EM | Admit: 2014-07-28 | Discharge: 2014-07-28 | Disposition: A | Discharge status: Other Acute Inpatient Hospital | Payer: Self-pay | Attending: Emergency Medicine | Admitting: Emergency Medicine

## 2014-07-28 ENCOUNTER — Encounter (HOSPITAL_COMMUNITY): Payer: Self-pay | Admitting: *Deleted

## 2014-07-28 ENCOUNTER — Inpatient Hospital Stay (HOSPITAL_COMMUNITY)
Admission: AD | Admit: 2014-07-28 | Discharge: 2014-08-03 | DRG: 885 | Disposition: A | Discharge status: Home / Self Care | Payer: Federal, State, Local not specified - Other | Source: Intra-hospital | Attending: Emergency Medicine | Admitting: Emergency Medicine

## 2014-07-28 ENCOUNTER — Encounter (HOSPITAL_COMMUNITY): Payer: Self-pay | Admitting: Emergency Medicine

## 2014-07-28 DIAGNOSIS — K219 Gastro-esophageal reflux disease without esophagitis: Secondary | ICD-10-CM | POA: Diagnosis present

## 2014-07-28 DIAGNOSIS — R45851 Suicidal ideations: Secondary | ICD-10-CM

## 2014-07-28 DIAGNOSIS — F17210 Nicotine dependence, cigarettes, uncomplicated: Secondary | ICD-10-CM | POA: Diagnosis present

## 2014-07-28 DIAGNOSIS — I10 Essential (primary) hypertension: Secondary | ICD-10-CM | POA: Insufficient documentation

## 2014-07-28 DIAGNOSIS — F1910 Other psychoactive substance abuse, uncomplicated: Secondary | ICD-10-CM

## 2014-07-28 DIAGNOSIS — F333 Major depressive disorder, recurrent, severe with psychotic symptoms: Secondary | ICD-10-CM | POA: Diagnosis present

## 2014-07-28 DIAGNOSIS — Z8249 Family history of ischemic heart disease and other diseases of the circulatory system: Secondary | ICD-10-CM | POA: Diagnosis not present

## 2014-07-28 DIAGNOSIS — F209 Schizophrenia, unspecified: Secondary | ICD-10-CM | POA: Insufficient documentation

## 2014-07-28 DIAGNOSIS — F259 Schizoaffective disorder, unspecified: Secondary | ICD-10-CM | POA: Diagnosis not present

## 2014-07-28 DIAGNOSIS — F1420 Cocaine dependence, uncomplicated: Secondary | ICD-10-CM | POA: Diagnosis present

## 2014-07-28 DIAGNOSIS — Z833 Family history of diabetes mellitus: Secondary | ICD-10-CM

## 2014-07-28 DIAGNOSIS — R1031 Right lower quadrant pain: Secondary | ICD-10-CM | POA: Diagnosis present

## 2014-07-28 DIAGNOSIS — F10129 Alcohol abuse with intoxication, unspecified: Secondary | ICD-10-CM | POA: Insufficient documentation

## 2014-07-28 DIAGNOSIS — Z79899 Other long term (current) drug therapy: Secondary | ICD-10-CM | POA: Insufficient documentation

## 2014-07-28 DIAGNOSIS — F329 Major depressive disorder, single episode, unspecified: Secondary | ICD-10-CM | POA: Insufficient documentation

## 2014-07-28 DIAGNOSIS — F1024 Alcohol dependence with alcohol-induced mood disorder: Secondary | ICD-10-CM | POA: Diagnosis present

## 2014-07-28 DIAGNOSIS — IMO0002 Reserved for concepts with insufficient information to code with codable children: Secondary | ICD-10-CM

## 2014-07-28 DIAGNOSIS — F1410 Cocaine abuse, uncomplicated: Secondary | ICD-10-CM | POA: Insufficient documentation

## 2014-07-28 DIAGNOSIS — Z720 Tobacco use: Secondary | ICD-10-CM | POA: Insufficient documentation

## 2014-07-28 DIAGNOSIS — K4090 Unilateral inguinal hernia, without obstruction or gangrene, not specified as recurrent: Secondary | ICD-10-CM | POA: Diagnosis present

## 2014-07-28 DIAGNOSIS — Z791 Long term (current) use of non-steroidal anti-inflammatories (NSAID): Secondary | ICD-10-CM | POA: Insufficient documentation

## 2014-07-28 DIAGNOSIS — Z8719 Personal history of other diseases of the digestive system: Secondary | ICD-10-CM | POA: Insufficient documentation

## 2014-07-28 LAB — COMPREHENSIVE METABOLIC PANEL
ALBUMIN: 4.4 g/dL (ref 3.5–5.0)
ALT: 30 U/L (ref 17–63)
AST: 28 U/L (ref 15–41)
Alkaline Phosphatase: 55 U/L (ref 38–126)
Anion gap: 14 (ref 5–15)
BUN: 12 mg/dL (ref 6–20)
CHLORIDE: 99 mmol/L — AB (ref 101–111)
CO2: 20 mmol/L — AB (ref 22–32)
Calcium: 9.2 mg/dL (ref 8.9–10.3)
Creatinine, Ser: 1.26 mg/dL — ABNORMAL HIGH (ref 0.61–1.24)
Glucose, Bld: 62 mg/dL — ABNORMAL LOW (ref 65–99)
Potassium: 3.9 mmol/L (ref 3.5–5.1)
Sodium: 133 mmol/L — ABNORMAL LOW (ref 135–145)
Total Bilirubin: 2 mg/dL — ABNORMAL HIGH (ref 0.3–1.2)
Total Protein: 7.7 g/dL (ref 6.5–8.1)

## 2014-07-28 LAB — CBC WITH DIFFERENTIAL/PLATELET
BASOS PCT: 0 % (ref 0–1)
Basophils Absolute: 0 10*3/uL (ref 0.0–0.1)
Eosinophils Absolute: 0.1 10*3/uL (ref 0.0–0.7)
Eosinophils Relative: 2 % (ref 0–5)
HCT: 45.3 % (ref 39.0–52.0)
HEMOGLOBIN: 15.7 g/dL (ref 13.0–17.0)
LYMPHS ABS: 3.4 10*3/uL (ref 0.7–4.0)
Lymphocytes Relative: 45 % (ref 12–46)
MCH: 28.5 pg (ref 26.0–34.0)
MCHC: 34.7 g/dL (ref 30.0–36.0)
MCV: 82.4 fL (ref 78.0–100.0)
MONO ABS: 0.8 10*3/uL (ref 0.1–1.0)
Monocytes Relative: 10 % (ref 3–12)
NEUTROS PCT: 43 % (ref 43–77)
Neutro Abs: 3.4 10*3/uL (ref 1.7–7.7)
PLATELETS: 322 10*3/uL (ref 150–400)
RBC: 5.5 MIL/uL (ref 4.22–5.81)
RDW: 14.1 % (ref 11.5–15.5)
WBC: 7.7 10*3/uL (ref 4.0–10.5)

## 2014-07-28 LAB — RAPID URINE DRUG SCREEN, HOSP PERFORMED
Amphetamines: NOT DETECTED
Barbiturates: NOT DETECTED
Benzodiazepines: NOT DETECTED
Cocaine: POSITIVE — AB
OPIATES: NOT DETECTED
Tetrahydrocannabinol: NOT DETECTED

## 2014-07-28 LAB — CBG MONITORING, ED: GLUCOSE-CAPILLARY: 73 mg/dL (ref 65–99)

## 2014-07-28 LAB — ETHANOL: Alcohol, Ethyl (B): 41 mg/dL — ABNORMAL HIGH (ref <5)

## 2014-07-28 MED ORDER — CHLORDIAZEPOXIDE HCL 25 MG PO CAPS
25.0000 mg | ORAL_CAPSULE | Freq: Every day | ORAL | Status: AC
  Administered 2014-08-01: 25 mg via ORAL
  Filled 2014-07-28: qty 1

## 2014-07-28 MED ORDER — ADULT MULTIVITAMIN W/MINERALS CH
1.0000 | ORAL_TABLET | Freq: Every day | ORAL | Status: DC
  Administered 2014-07-29 – 2014-08-03 (×6): 1 via ORAL
  Filled 2014-07-28 (×11): qty 1

## 2014-07-28 MED ORDER — ONDANSETRON HCL 4 MG PO TABS: 4.0000 mg | ORAL_TABLET | Freq: Three times a day (TID) | ORAL | Status: DC | PRN

## 2014-07-28 MED ORDER — CHLORDIAZEPOXIDE HCL 25 MG PO CAPS: 25.0000 mg | ORAL_CAPSULE | Freq: Four times a day (QID) | ORAL | Status: AC | PRN

## 2014-07-28 MED ORDER — ACETAMINOPHEN 325 MG PO TABS: 650.0000 mg | ORAL_TABLET | ORAL | Status: DC | PRN

## 2014-07-28 MED ORDER — ONDANSETRON 4 MG PO TBDP: 4.0000 mg | ORAL_TABLET | Freq: Four times a day (QID) | ORAL | Status: AC | PRN

## 2014-07-28 MED ORDER — ACETAMINOPHEN 325 MG PO TABS: 650.0000 mg | ORAL_TABLET | Freq: Four times a day (QID) | ORAL | Status: DC | PRN

## 2014-07-28 MED ORDER — CHLORDIAZEPOXIDE HCL 25 MG PO CAPS
25.0000 mg | ORAL_CAPSULE | Freq: Three times a day (TID) | ORAL | Status: AC
  Administered 2014-07-30 (×3): 25 mg via ORAL
  Filled 2014-07-28 (×3): qty 1

## 2014-07-28 MED ORDER — HYDROCODONE-ACETAMINOPHEN 5-325 MG PO TABS
1.0000 | ORAL_TABLET | Freq: Four times a day (QID) | ORAL | Status: DC | PRN
  Administered 2014-07-29 – 2014-07-30 (×4): 1 via ORAL
  Filled 2014-07-28 (×4): qty 1

## 2014-07-28 MED ORDER — CHLORDIAZEPOXIDE HCL 25 MG PO CAPS
25.0000 mg | ORAL_CAPSULE | ORAL | Status: AC
  Administered 2014-07-31 (×2): 25 mg via ORAL
  Filled 2014-07-28 (×2): qty 1

## 2014-07-28 MED ORDER — HYDROXYZINE HCL 25 MG PO TABS: 25.0000 mg | ORAL_TABLET | Freq: Four times a day (QID) | ORAL | Status: AC | PRN

## 2014-07-28 MED ORDER — MAGNESIUM HYDROXIDE 400 MG/5ML PO SUSP: 30.0000 mL | Freq: Every day | ORAL | Status: DC | PRN

## 2014-07-28 MED ORDER — HYDROCODONE-ACETAMINOPHEN 5-325 MG PO TABS
ORAL_TABLET | ORAL | Status: AC
  Administered 2014-07-28: 20:00:00
  Filled 2014-07-28: qty 1

## 2014-07-28 MED ORDER — CHLORDIAZEPOXIDE HCL 25 MG PO CAPS
25.0000 mg | ORAL_CAPSULE | Freq: Four times a day (QID) | ORAL | Status: AC
  Administered 2014-07-28 – 2014-07-29 (×5): 25 mg via ORAL
  Filled 2014-07-28 (×4): qty 1

## 2014-07-28 MED ORDER — MELOXICAM 7.5 MG PO TABS
15.0000 mg | ORAL_TABLET | Freq: Every day | ORAL | Status: DC
  Administered 2014-07-28 – 2014-07-31 (×4): 15 mg via ORAL
  Filled 2014-07-28: qty 2
  Filled 2014-07-28 (×2): qty 1
  Filled 2014-07-28: qty 2
  Filled 2014-07-28: qty 1
  Filled 2014-07-28 (×2): qty 2
  Filled 2014-07-28: qty 1

## 2014-07-28 MED ORDER — ALUM & MAG HYDROXIDE-SIMETH 200-200-20 MG/5ML PO SUSP
30.0000 mL | ORAL | Status: DC | PRN
  Administered 2014-07-31: 30 mL via ORAL
  Filled 2014-07-28: qty 30

## 2014-07-28 MED ORDER — LORAZEPAM 1 MG PO TABS: 1.0000 mg | ORAL_TABLET | Freq: Three times a day (TID) | ORAL | Status: DC | PRN

## 2014-07-28 MED ORDER — MELOXICAM 7.5 MG PO TABS
ORAL_TABLET | ORAL | Status: AC
  Filled 2014-07-28: qty 2

## 2014-07-28 MED ORDER — ALUM & MAG HYDROXIDE-SIMETH 200-200-20 MG/5ML PO SUSP: 30.0000 mL | ORAL | Status: DC | PRN

## 2014-07-28 MED ORDER — CHLORDIAZEPOXIDE HCL 25 MG PO CAPS: 25.0000 mg | ORAL_CAPSULE | Freq: Once | ORAL | Status: AC

## 2014-07-28 MED ORDER — LOPERAMIDE HCL 2 MG PO CAPS: 2.0000 mg | ORAL_CAPSULE | ORAL | Status: AC | PRN

## 2014-07-28 MED ORDER — MAGNESIUM HYDROXIDE 400 MG/5ML PO SUSP
30.0000 mL | Freq: Every day | ORAL | Status: DC | PRN
  Administered 2014-07-29 – 2014-08-02 (×4): 30 mL via ORAL
  Filled 2014-07-28 (×4): qty 30

## 2014-07-28 MED ORDER — CHLORDIAZEPOXIDE HCL 25 MG PO CAPS
ORAL_CAPSULE | ORAL | Status: AC
  Administered 2014-07-28: 20:00:00
  Filled 2014-07-28: qty 2

## 2014-07-28 NOTE — ED Notes (Signed)
Pt. requesting detox from alcohol and Cocaine abuse , last ETOH and Cocaine this morning , denies suicidal ideation /no hallucinations .

## 2014-07-28 NOTE — ED Notes (Signed)
Gave report to RN in pod C

## 2014-07-28 NOTE — ED Notes (Signed)
Called staffing for sitter 

## 2014-07-28 NOTE — ED Notes (Signed)
Sitter from bed control is at bedside now

## 2014-07-28 NOTE — ED Notes (Signed)
Pt placed in maroon scrubs

## 2014-07-28 NOTE — ED Notes (Signed)
Pt notified NT that he was having SI. NT told this RN. This RN spoke with pt concerning his thoughts. Pt states he is feeling depressed and just wants to end it all. States he has several plans. And feels that if he left here today he would hurt himself. RN notified MD. Jeremy Johannech at bedside with pt. Obtained pt's belongings. Pt placed in maroon scrubs. MD at bedside. Will have pt wanded

## 2014-07-28 NOTE — Progress Notes (Signed)
D.  Pt has remained in bed, states he has severe chronic back and neck pain.  Started on Librium protocol, Pt did not feel well enough to attend evening AA group.  Minimal interaction due to pain.  Denies SI/HI/ at this time, does endorse some auditory and visual hallucinations that he described as "shadows".  A.  Support and encouragement offered, pain medication given as ordered (see pain flow sheet).  R.  Pt remains safe on unit, will continue to monitor.

## 2014-07-28 NOTE — ED Notes (Signed)
TTS monitor set up at bedside

## 2014-07-28 NOTE — BH Assessment (Signed)
Tele Assessment Note   Lonnie [name] is an 49 y.o. male.   Axis I: 295.70 Schizoaffective disorder, 303.90 Alcohol use disorder, Moderate, 304.20 Cocaine use disorder, Moderate Axis II: Deferred Axis III:  Past Medical History  Diagnosis Date  . GERD (gastroesophageal reflux disease)   . Depression   . Lumbago   . ETOH abuse   . Cocaine abuse   . Xanax use disorder, mild, abuse   . Marijuana abuse   . Schizophrenia   . Hypertension     pt reports that he has hx   Axis IV: economic problems, housing problems, occupational problems, other psychosocial or environmental problems, problems related to social environment, problems with access to health care services and problems with primary support group Axis V: 21-30 behavior considerably influenced by delusions or hallucinations OR serious impairment in judgment, communication OR inability to function in almost all areas  Past Medical History:  Past Medical History  Diagnosis Date  . GERD (gastroesophageal reflux disease)   . Depression   . Lumbago   . ETOH abuse   . Cocaine abuse   . Xanax use disorder, mild, abuse   . Marijuana abuse   . Schizophrenia   . Hypertension     pt reports that he has hx    Past Surgical History  Procedure Laterality Date  . Back surgery    . Neck surgery    . Esophagogastroduodenoscopy (egd) with propofol N/A 07/30/2013    Procedure: ESOPHAGOGASTRODUODENOSCOPY (EGD) WITH PROPOFOL;  Surgeon: Barrie Folk, MD;  Location: [facility];  Service: Endoscopy;  Laterality: N/A;    Family History:  Family History  Problem Relation Age of Onset  . Diabetes Mother   . Hypertension Mother   . Hyperlipidemia Father   . Heart attack Neg Hx   . Sudden death Neg Hx     Social History:  reports that he has been smoking Cigarettes.  He has been smoking about 0.00 packs per day for the past 0 years. He has never used smokeless tobacco. He reports that he drinks alcohol. He reports that he uses illicit  drugs (Cocaine).  Additional Social History:  Alcohol / Drug Use Pain Medications: see med list Prescriptions: see med list Over the Counter: see med list History of alcohol / drug use?: Yes Longest period of sobriety (when/how long): almost 1 year on June 29 Negative Consequences of Use: Financial, Personal relationships Withdrawal Symptoms:  (na-pt denies) Substance #1 Name of Substance 1: Alcohol 1 - Age of First Use: 9 1 - Amount (size/oz): helped drink 5 cases in past 3 days 1 - Frequency: daily past 3 days 1 - Duration: ongoing for years 1 - Last Use / Amount: last ngiht - "too much" Substance #2 Name of Substance 2: Cocaine 2 - Age of First Use: 20 2 - Amount (size/oz): $700 2 - Frequency: daily past 3 days 2 - Duration: ongoing for years 2 - Last Use / Amount: last night -"too much"  CIWA: CIWA-Ar BP: 110/68 mmHg Pulse Rate: 79 Nausea and Vomiting: no nausea and no vomiting Tactile Disturbances: none Tremor: no tremor Auditory Disturbances: not present Paroxysmal Sweats: no sweat visible Visual Disturbances: not present Anxiety: no anxiety, at ease Headache, Fullness in Head: none present Agitation: normal activity Orientation and Clouding of Sensorium: oriented and can do serial additions CIWA-Ar Total: 0 COWS:    PATIENT STRENGTHS: (choose at least two) Average or above average intelligence General fund of knowledge  Allergies:  Allergies  Allergen Reactions  . Ibuprofen Itching  . Morphine And Related Nausea And Vomiting    Home Medications:  (Not in a hospital admission)  OB/GYN Status:  No LMP for male patient.  General Assessment Data [facility] Assessment: In system Is this a Tele or Face-to-Face Assessment?: Tele Assessment Is this an Initial Assessment or a Re-assessment for this encounter?: Initial Assessment Marital status: Single Maiden name:  (na) Is patient pregnant?:  (na) Pregnancy Status:  (na) Living  Arrangements: Other relatives Can pt return to current living arrangement?: Yes Admission Status: Voluntary Is patient capable of signing voluntary admission?: Yes Referral Source: Self/Family/Friend Insurance type: None  Medical Screening Exam [facility] Walk-in ONLY) Medical Exam completed:  (na)  Crisis Care Plan Living Arrangements: Other relatives Name of Psychiatrist: [facility] Name of Therapist: [facility]  Education Status Is patient currently in school?: No Current Grade: na Highest grade of school patient has completed: HS grad Name of school: na Contact person: na  Risk to self with the past 6 months Suicidal Ideation: Yes-Currently Present Has patient been a risk to self within the past 6 months prior to admission? : Yes Suicidal Intent: Yes-Currently Present Has patient had any suicidal intent within the past 6 months prior to admission? : Yes Is patient at risk for suicide?: Yes Suicidal Plan?: Yes-Currently Present Has patient had any suicidal plan within the past 6 months prior to admission? : Yes Specify Current Suicidal Plan: to overdose on sleeping pills Access to Means: Yes Specify Access to Suicidal Means: can access medications What has been your use of drugs/alcohol within the last 12 months?: pt relapsed on alcohol and cocaine Previous Attempts/Gestures: No How many times?: 0 Other Self Harm Risks: na-pt denies Triggers for Past Attempts: Other (Comment) (hears voices, depression, SA) Intentional Self Injurious Behavior: None Family Suicide History: No Recent stressful life event(s): Loss (Comment), Financial Problems, Other (Comment) (SI with plan, depression, relapse) Persecutory voices/beliefs?: Yes Depression: Yes Depression Symptoms: Despondent, Insomnia, Tearfulness, Isolating, Fatigue, Guilt, Loss of interest in usual pleasures, Feeling worthless/self pity, Feeling angry/irritable Substance abuse history and/or treatment for substance abuse?:  Yes Suicide prevention information given to non-admitted patients: Not applicable  Risk to Others within the past 6 months Homicidal Ideation: No Does patient have any lifetime risk of violence toward others beyond the six months prior to admission? : No Thoughts of Harm to Others: No Current Homicidal Intent: No Current Homicidal Plan: No Access to Homicidal Means: No Identified Victim: na-pt denies History of harm to others?: No Assessment of Violence: None Noted Violent Behavior Description: na-pt calm, cooperative Does patient have access to weapons?: No Criminal Charges Pending?: No Does patient have a court date: No Is patient on probation?: No  Psychosis Hallucinations: Auditory, With command (hears voices telling him to do things) Delusions: None noted  Mental Status Report Appearance/Hygiene: Disheveled, In scrubs Eye Contact: Fair Motor Activity: Freedom of movement, Unremarkable Speech: Logical/coherent, Soft Level of Consciousness: Quiet/awake Mood: Depressed Affect: Appropriate to circumstance Anxiety Level: Minimal Thought Processes: Coherent, Relevant Judgement: Impaired Orientation: Person, Place, Situation Obsessive Compulsive Thoughts/Behaviors: None  Cognitive Functioning Concentration: Decreased Memory: Recent Intact, Remote Intact IQ: Average Insight: Fair Impulse Control: Fair Appetite: Poor Weight Loss:  (pt stated he has lost weight, not sure how much) Weight Gain: 0 Sleep: Decreased Total Hours of Sleep: 3 Vegetative Symptoms: None  ADLScreening [facility] Assessment Services) Patient's cognitive ability adequate to safely complete daily activities?: Yes Patient able to express need for  assistance with ADLs?: Yes Independently performs ADLs?: Yes (appropriate for developmental age)  Prior Inpatient Therapy Prior Inpatient Therapy: Yes Prior Therapy Dates: 2002, 2015 Prior Therapy Facilty[facility]Provider[facility] Reason for Treatment: SI,  SA  Prior Outpatient Therapy Prior Outpatient Therapy: Yes Prior Therapy Dates: Ongoing Prior Therapy Facilty/Provider(s): [facility] Reason for Treatment: Med mgnt/counseling Does patient have an ACCT team?: No Does patient have Intensive In-House Services?  : No Does patient have [facility] services? : No Does patient have P4CC services?: No  ADL Screening (condition at time of admission) Patient's cognitive ability adequate to safely complete daily activities?: Yes Is the patient deaf or have difficulty hearing?: No Does the patient have difficulty seeing, even when wearing glasses/contacts?: No Does the patient have difficulty concentrating, remembering, or making decisions?: Yes Patient able to express need for assistance with ADLs?: Yes Does the patient have difficulty dressing or bathing?: No Independently performs ADLs?: Yes (appropriate for developmental age) Does the patient have difficulty walking or climbing stairs?: No  Home Assistive Devices/Equipment Home Assistive Devices/Equipment: None    Abuse/Neglect Assessment (Assessment to be complete while patient is alone) Physical Abuse: Denies Verbal Abuse: Denies Sexual Abuse: Denies Exploitation of patient/patient's resources: Denies Self-Neglect: Denies Values / Beliefs Cultural Requests During Hospitalization: None Spiritual Requests During Hospitalization: None Consults Spiritual Care Consult Needed: No Social Work Consult Needed: No Merchant navy officer (For Healthcare) Does patient have an advance directive?: No Would patient like information on creating an advanced directive?: No - patient declined information    Additional Information 1:1 In Past 12 Months?: No CIRT Risk: No Elopement Risk: No Does patient have medical clearance?: Yes     Disposition:  Disposition Initial Assessment Completed for this Encounter: Yes Disposition of Patient: Referred to, Inpatient treatment program Type of inpatient  treatment program: Adult  Casimer Lanius, MS, [facility] Therapeutic Triage Specialist [facility]   07/28/2014 11:39 AM

## 2014-07-28 NOTE — ED Provider Notes (Signed)
Patient now awake and alert. Patient originally requesting detox from alcohol cocaine abuse. Upon presentation patient denied any suicidal or homicidal ideations. Any hallucinations. Patient now saying he suicidal. Patient we moved back to pot C for basal health evaluation.  Vanetta MuldersScott Mae Cianci, MD 07/28/14 1050

## 2014-07-28 NOTE — BH Assessment (Signed)
[facility] Assessment Progress Note   Called and scheduled pt's tele assessment with this clinician.  Called EDP Zackowski to gather clinical information on the pt.    Casimer LaniusKristen Lucita Montoya, MS, [facility] Therapeutic Triage Specialist [facility]

## 2014-07-28 NOTE — ED Notes (Signed)
Patient placed in blue scrubs

## 2014-07-28 NOTE — ED Notes (Signed)
Per Simonne ComeLeo, SW, [facility] Dr Dub MikesLugo - may be transported after 6pm.

## 2014-07-28 NOTE — Progress Notes (Signed)
Patient did not attend AA Speaker group tonight, he went to lay down in his bed. Has stated that he has back pain

## 2014-07-28 NOTE — ED Notes (Signed)
Pt called out and expressed to this EMT that he "didnt feel like living anymore" "im having stupid thoughts" " i may want to hurt myself" this EMT stayed with patient until Nurse came to talk with pt.
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
## 2014-07-28 NOTE — ED Notes (Signed)
[facility] & copy to Med Records.
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
## 2014-07-28 NOTE — ED Notes (Signed)
Pt has been wanded. Pt belongings at nurse station. NT at bedside with pt. Meal tray has been ordered for pt. Pt given crackers and coke

## 2014-07-29 ENCOUNTER — Encounter (HOSPITAL_COMMUNITY): Payer: Self-pay | Admitting: Psychiatry

## 2014-07-29 DIAGNOSIS — F259 Schizoaffective disorder, unspecified: Secondary | ICD-10-CM

## 2014-07-29 DIAGNOSIS — F1910 Other psychoactive substance abuse, uncomplicated: Secondary | ICD-10-CM

## 2014-07-29 MED ORDER — NICOTINE 21 MG/24HR TD PT24
21.0000 mg | MEDICATED_PATCH | Freq: Every day | TRANSDERMAL | Status: DC
  Administered 2014-07-29 – 2014-08-03 (×5): 21 mg via TRANSDERMAL
  Filled 2014-07-29 (×11): qty 1

## 2014-07-29 MED ORDER — QUETIAPINE FUMARATE 100 MG PO TABS
100.0000 mg | ORAL_TABLET | Freq: Every day | ORAL | Status: DC
  Administered 2014-07-29: 100 mg via ORAL
  Filled 2014-07-29 (×4): qty 1
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
## 2014-07-29 NOTE — Progress Notes (Signed)
Patient attended AA speaker group tonight.  
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
## 2014-07-29 NOTE — Progress Notes (Signed)
Patient on hall way during this assessment. His mood and affects sad and depressed. He reported that he has pain on his rt groin all day but was medicated already. Patient requested for Seroquel for sleep. He said Trazodone doesn't work for him. Writer notified doctor CueroLugo and received a verbal order for Seroquel 100 mg for patient. Q 15 minute check continues as ordered for safety.
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
## 2014-07-29 NOTE — Progress Notes (Signed)
NS[facility] shift assessment. 7a-7p.   D: Affect blunted, mood depressed, behavior appropriate. Spent the morning in bed after getting his medications. Completed a Patient Self Inventory and his goal today is to work on his depression. He plans to achieve that goal by talking to other people.  He expressed concern about an abdominal hernia that he has been told could rupture at any time. Therefore, he tries not to exert himself and he is hoping to get hernia repair. Attends groups and participates. Cooperative with staff and is getting along well with peers. Complains of chronic back pain.  A: Observed pt interacting in group and in the milieu: Support and encouragement offered. Safety maintained with observations every 15 minutes.   R:   Contracts for safety and continues to follow the treatment plan, working on learning new coping skills.

## 2014-07-29 NOTE — [facility] Group Notes (Signed)
   [facility]  07/29/2014  1:15 PM   Participation Quality: Alert, Appropriate and Oriented  Mood/Affect: Flat and depressed  Depression Rating: Did not rate  Anxiety Rating: Did not rate  Thoughts of Suicide: Pt denies SI/HI  Will you contract for safety? Yes  Current AVH: Pt denies  Plan for Discharge/Comments: Pt attended discharge planning group and actively participated in group. CSW provided pt with today's workbook. Patient shared that he is concerned about his health issues and is interested in a referral to [facility]. Alternative plan will be to return [facility].  Transportation Means: Pt reports access to transportation  Supports: No supports mentioned at this time  Samuella BruinKristin Gabriel Conry, MSW, [employer] IncLCSWA Clinical Social Worker [facility] [phone]

## 2014-07-30 MED ORDER — MAGNESIUM CITRATE PO SOLN
1.0000 | Freq: Once | ORAL | Status: AC
  Administered 2014-07-30: 1 via ORAL

## 2014-07-30 MED ORDER — QUETIAPINE FUMARATE 200 MG PO TABS
200.0000 mg | ORAL_TABLET | Freq: Every day | ORAL | Status: DC
  Administered 2014-07-30 – 2014-08-02 (×4): 200 mg via ORAL
  Filled 2014-07-30 (×8): qty 1

## 2014-07-30 MED ORDER — HYDROCODONE-ACETAMINOPHEN 5-325 MG PO TABS
1.0000 | ORAL_TABLET | ORAL | Status: DC | PRN
  Administered 2014-07-30 – 2014-08-03 (×15): 1 via ORAL
  Filled 2014-07-30 (×16): qty 1

## 2014-07-30 NOTE — Progress Notes (Signed)
[facility] MD Progress Note  07/30/2014 8:22 PM [name]  MRN:  [id number] Subjective:  Lonnie [name] continues to have a hard time. He states he is in pain ( tearful) wants to feel better but states he is not gong to be able to handle this for too long. States he does not want to live like this. He states he would kill himself if he cant get the hernia repaired. States that the hernia keeps him from doing the things he wants to do does not feel it is worth living with the quality of life that he has Principal Problem: MDD (major depressive disorder), recurrent, severe, with psychosis Diagnosis:   Patient Active Problem List   Diagnosis Date Noted  . MDD (major depressive disorder), recurrent, severe, with psychosis [F33.3] 07/28/2014  . Slurred speech [R47.81] 08/09/2013  . TIA (transient ischemic attack) [G45.9] 08/09/2013  . Polyuria [R35.8] 08/09/2013  . Hematemesis [K92.0] 07/30/2013  . GI bleed [K92.2] 07/30/2013  . Schizoaffective disorder [F25.9] 07/29/2013  . Major depression, recurrent [F33.9] 07/25/2013  . Cocaine dependence [F14.20] 07/25/2013  . Alcohol dependence with alcohol-induced mood disorder [F10.24] 07/24/2013  . Depression, major, recurrent [F33.9] 05/17/2013  . Bilateral foot pain [id number], M79.672] 11/25/2011  . Chronic low back pain [M54.5, G89.29] 10/15/2011   Total Time spent with patient: 30 minutes   Past Medical History:  Past Medical History  Diagnosis Date  . GERD (gastroesophageal reflux disease)   . Depression   . Lumbago   . ETOH abuse   . Cocaine abuse   . Xanax use disorder, mild, abuse   . Marijuana abuse   . Schizophrenia   . Hypertension     pt reports that he has hx    Past Surgical History  Procedure Laterality Date  . Back surgery    . Neck surgery    . Esophagogastroduodenoscopy (egd) with propofol N/A 07/30/2013    Procedure: ESOPHAGOGASTRODUODENOSCOPY (EGD) WITH PROPOFOL;  Surgeon: Barrie Folk, MD;  Location: [facility];  Service:  Endoscopy;  Laterality: N/A;   Family History:  Family History  Problem Relation Age of Onset  . Diabetes Mother   . Hypertension Mother   . Hyperlipidemia Father   . Heart attack Neg Hx   . Sudden death Neg Hx    Social History:  History  Alcohol Use  . Yes     History  Drug Use  . Yes  . Special: Cocaine    History   Social History  . Marital Status: Single    Spouse Name: N/A  . Number of Children: N/A  . Years of Education: N/A   Social History Main Topics  . Smoking status: Current Every Day Smoker -- 0.00 packs/day for 0 years    Types: Cigarettes    Last Attempt to Quit: 08/08/2012  . Smokeless tobacco: Never Used  . Alcohol Use: Yes  . Drug Use: Yes    Special: Cocaine  . Sexual Activity: Not on file     Comment: sober x 7 months   Other Topics Concern  . None   Social History Narrative   Pt reports that he lives with his partner and their kids near [place] until recently moving to the [facility] house a week ago for residential substance abuse treatment. He reports a history of smoking cigarettes and significant alcohol consumption but has stopped since admission to [facility]. He has reports being clean from cocaine for the last 23 days. He has been unemployed for the past  3 years.    Additional History:    Sleep: Fair  Appetite:  Poor   Assessment:   Musculoskeletal: Strength & Muscle Tone: within normal limits Gait & Station: bend over due to pain Patient leans: Front   Psychiatric Specialty Exam: Physical Exam  Review of Systems  Constitutional: Positive for malaise/fatigue.  HENT: Negative.   Eyes: Negative.   Respiratory: Negative.   Cardiovascular: Negative.   Gastrointestinal: Positive for abdominal pain.       Inguinal hernia; pain  Musculoskeletal: Positive for back pain.  Skin: Negative.   Neurological: Negative.   Endo/Heme/Allergies: Negative.   Psychiatric/Behavioral: Positive for depression, suicidal ideas and substance  abuse. The patient is nervous/anxious and has insomnia.     Blood pressure 126/79, pulse 89, temperature 99.5 F (37.5 C), temperature source Oral, resp. rate 14, height 5' 9.25" (1.759 m), weight 79.379 kg (175 lb).Body mass index is 25.66 kg/(m^2).  General Appearance: Fairly Groomed  Patent attorney::  Fair  Speech:  Clear and Coherent, Slow and not spontaneous  Volume:  Decreased  Mood:  Anxious, Depressed and in pain  Affect:  Depressed and Tearful  Thought Process:  Coherent and Goal Directed  Orientation:  Full (Time, Place, and Person)  Thought Content:  symptoms events worries concerns  Suicidal Thoughts:  On and off when the pain gets worst  Homicidal Thoughts:  No  Memory:  Immediate;   Fair Recent;   Fair Remote;   Fair  Judgement:  Fair  Insight:  Present  Psychomotor Activity:  Restlessness  Concentration:  Fair  Recall:  [employer] of Knowledge:Fair  Language: Fair  Akathisia:  No  Handed:  Right  AIMS (if indicated):     Assets:  Desire for Improvement  ADL's:  Intact  Cognition: WNL  Sleep:  Number of Hours: 6.5     Current Medications: Current Facility-Administered Medications  Medication Dose Route Frequency Provider Last Rate Last Dose  . acetaminophen (TYLENOL) tablet 650 mg  650 mg Oral Q6H PRN Tamala Julian, PA-C      . alum & mag hydroxide-simeth (MAALOX/MYLANTA) 200-200-20 MG/5ML suspension 30 mL  30 mL Oral Q4H PRN Tamala Julian, PA-C      . chlordiazePOXIDE (LIBRIUM) capsule 25 mg  25 mg Oral Q6H PRN Rachael Fee, MD      . Melene Muller ON 07/31/2014] chlordiazePOXIDE (LIBRIUM) capsule 25 mg  25 mg Oral BH-qamhs Rachael Fee, MD       Followed by  . [START ON 08/01/2014] chlordiazePOXIDE (LIBRIUM) capsule 25 mg  25 mg Oral Daily Rachael Fee, MD      . HYDROcodone-acetaminophen (NORCO/VICODIN) 5-325 MG per tablet 1 tablet  1 tablet Oral Q4H PRN Rachael Fee, MD   1 tablet at 07/30/14 1656  . hydrOXYzine (ATARAX/VISTARIL) tablet 25 mg  25 mg Oral Q6H  PRN Rachael Fee, MD      . loperamide (IMODIUM) capsule 2-4 mg  2-4 mg Oral PRN Rachael Fee, MD      . magnesium hydroxide (MILK OF MAGNESIA) suspension 30 mL  30 mL Oral Daily PRN Tamala Julian, PA-C   30 mL at 07/30/14 0523  . meloxicam (MOBIC) tablet 15 mg  15 mg Oral Daily Rachael Fee, MD   15 mg at 07/30/14 1610  . multivitamin with minerals tablet 1 tablet  1 tablet Oral Daily Rachael Fee, MD   1 tablet at 07/30/14 [phone]  . nicotine (NICODERM CQ -  dosed in mg/24 hours) patch 21 mg  21 mg Transdermal Daily Rachael FeeIrving A Parmvir Boomer, MD   21 mg at 07/30/14 0810  . ondansetron (ZOFRAN-ODT) disintegrating tablet 4 mg  4 mg Oral Q6H PRN Rachael FeeIrving A Tifani Dack, MD      . QUEtiapine (SEROQUEL) tablet 200 mg  200 mg Oral QHS Rachael FeeIrving A Aashi Derrington, MD        Lab Results: No results found for this or any previous visit (from the past 48 hour(s)).  Physical Findings: AIMS: Facial and Oral Movements Muscles of Facial Expression: None, normal Lips and Perioral Area: None, normal Jaw: None, normal Tongue: None, normal,Extremity Movements Upper (arms, wrists, hands, fingers): None, normal Lower (legs, knees, ankles, toes): None, normal, Trunk Movements Neck, shoulders, hips: None, normal, Overall Severity Severity of abnormal movements (highest score from questions above): None, normal Incapacitation due to abnormal movements: None, normal Patient's awareness of abnormal movements (rate only patient's report): No Awareness, Dental Status Current problems with teeth and/or dentures?: No Does patient usually wear dentures?: No  CIWA:  CIWA-Ar Total: 0 COWS:  COWS Total Score: 0  Treatment Plan Summary: Daily contact with patient to assess and evaluate symptoms and progress in treatment and Medication management Supportive approach/coping skills Alcohol dependence; continue the detox protocol Work a relapse prevention plan Insomnia; will increase the Seroquel to 200 mg HS Pain; will  change the Oxycodone to Q 4 H  PRN Inguinal Hernia; will explore options for him to get his surgery Constipation; will use mag citrate  Medical Decision Making:  Review of Psycho-Social Stressors (1), Review of Medication Regimen & Side Effects (2) and Review of New Medication or Change in Dosage (2)     Dody Smartt A 07/30/2014, 8:22 PM

## 2014-07-30 NOTE — Progress Notes (Signed)
D: Lonnie AguasHoward admits SI but contracts for safety. He admits hearing his grandmother's voice occasionally but denies VH, HI. He has persistent pain in the right groin r/t a hernia. He reports having nightmares nightly, which he says is unusual for him. He reported not having a bowel movement for five days. He hopes to have the hernia surgically repaired. A: Medications given as ordered, including mag citrate (with positive results). Q15 safety checks maintained. Support/encouragement offered.  R. Pt remains free from harm and proceeds with treatment. Will continue to monitor for needs/safety.

## 2014-07-30 NOTE — [facility] Group Notes (Signed)
Adult Psychoeducational Group Note  Date:  07/30/2014 Time:  0850am  Group Topic/Focus:  Goals Group:   The focus of this group is to help patients establish daily goals to achieve during treatment and discuss how the patient can incorporate goal setting into their daily lives to aide in recovery. Orientation:   The focus of this group is to educate the patient on the purpose and policies of crisis stabilization and provide a format to answer questions about their admission.  The group details unit policies and expectations of patients while admitted.  Participation Level:  Did Not Attend  Lauris Poagndrea B Kensy Blizard 07/30/2014, 2:42 PM

## 2014-07-30 NOTE — Progress Notes (Signed)
[facility] Group Notes:  (Nursing/MHT/Case Management/Adjunct)  Date:  07/30/2014  Time:  11:27 PM  Type of Therapy:  Psychoeducational Skills  Participation Level:  Active  Participation Quality:  Appropriate  Affect:  Appropriate  Cognitive:  Appropriate  Insight:  Appropriate  Engagement in Group:  Engaged  Modes of Intervention:  Discussion  Summary of Progress/Problems: Tonight in wrap up group Lonnie [name] said that he had an OK day today, he was finally getting the [facility] needs [facility] but says that he feels better just a little depressed.  [name] N [name] 07/30/2014, 11:27 PM

## 2014-07-30 NOTE — Progress Notes (Signed)
D:  Per pt self inventory pt reports sleeping poor, appetite fair, energy level low, ability to pay attention poor, rates depression at a 10 out of 10, hopelessness at a 10 out of 10, anxiety at an 8 out of 10, denies HI/AVH, endorses SI almost all the time, contracts for safety, very depressed during interaction, goal today:  "Not to think about my past and rest"   A:  Emotional support provided, Encouraged pt to continue with treatment plan and attend all group activities, q15 min checks maintained for safety.  R:  Pt is going to some groups but needs some continued encouragement, stays in room sometimes and needs encouragement to come out into the milieu, pleasant and cooperative with staff and other patients on the unit.

## 2014-07-30 NOTE — Progress Notes (Signed)
Recreation Therapy Notes  Animal-Assisted Activity (AAA) Program Checklist/Progress Notes Patient Eligibility Criteria Checklist & Daily Group note for Rec Tx Intervention  Date: 07.05.16 Time: 230 pm Location: 400 Hall Dayroom   AAA/T Program Assumption of Risk Form signed by Patient/ or Parent Legal Guardian yes  Patient is free of allergies or sever asthma yes  Patient reports no fear of animals yes  Patient reports no history of cruelty to animalsyes  Patient understands his/her participation is voluntary yes  Patient washes hands before animal contact yes  Patient washes hands after animal contact yes  Education: Hand Washing, Appropriate Animal Interaction   Education Outcome: Acknowledges understanding/In group clarification offered/Needs additional education.   Clinical Observations/Feedback: Did not attend group.   Caroll RancherMarjette Emanuela Runnion, LRT/CTRS         Caroll RancherLindsay, Ifeanyichukwu Wickham A 07/30/2014 4:40 PM

## 2014-07-30 NOTE — Tx Team (Signed)
Interdisciplinary Treatment Plan Update (Adult)  Date:  07/30/2014 Time Reviewed:  7:03 PM  Progress in Treatment: Attending groups: Yes. Participating in groups:  Yes. Taking medication as prescribed:  Yes. Tolerating medication:  Yes. Family/Significant othe contact made:  No, will contact:  family w patient permission Patient understands diagnosis:  Yes. Discussing patient identified problems/goals with staff:  Yes. Medical problems stabilized or resolved:  No. Denies suicidal/homicidal ideation: Yes. Issues/concerns per patient self-inventory:  No. Other:  New problem(s) identified: Yes, Describe:  patient has severe pain due to hernia that needs surgical correction, pain contributes to depression and mood disorder as well as substance use  Discharge Plan or Barriers:  Lack of insurance for medical follow up needed  Reason for Continuation of Hospitalization: Medication stabilization Withdrawal symptoms  Comments: 07/30/14: Patient has persistent sadness and SI, recent divorce and child custody/visitation issues, recent job loss.  Rumination, depressed mood. Major pain due to needed surgery for hernia - cannot afford, has no insurance.    Estimated length of stay:  3 - 5 days  New goal(s):  Referral to medical facility for tx of painful hernia, patient has had preop visit for surgical repair, cannot afford surgery cost  Review of initial/current patient goals per problem list:  See initial plan of care   Attendees: Patient:   7/5/20167:03 PM  Family:   7/5/20167:03 PM  Physician:  Jola BaptistI Lugo MD 7/5/20167:03 PM  Nursing:   Sue LushAndrea RN 7/5/20167:03 PM  Case Manager:  Santa GeneraAnne Cunningham  LCSW 7/5/20167:03 PM  Counselor:   7/5/20167:03 PM  Other:  Sherron FlemingsVal Enoch [facility] TCT 7/5/20167:03 PM  Other:  Sondra BargesJ Clark, RN UR 7/5/20167:03 PM  Other:   7/5/20167:03 PM  Other:  7/5/20167:03 PM  Other:  7/5/20167:03 PM  Other:  7/5/20167:03 PM  Other:  7/5/20167:03 PM  Other:  7/5/20167:03 PM   Other:  7/5/20167:03 PM  Other:   7/5/20167:03 PM   Scribe for Treatment Team:   Sallee Langeunningham, Anne C, 07/30/2014, 7:03 PM

## 2014-07-30 NOTE — [facility] Group Notes (Signed)
[facility] Therapy 07/30/2014 1:15pm  Type of Therapy: Mental [facility] Presentation  Pt did not attend, declined invitation.   Chad CordialLauren Carter, LCSWA 07/30/2014 1:33 PM

## 2014-07-31 MED ORDER — ONDANSETRON 4 MG PO TBDP: 8.0000 mg | ORAL_TABLET | Freq: Three times a day (TID) | ORAL | Status: DC | PRN

## 2014-07-31 MED ORDER — PANTOPRAZOLE SODIUM 40 MG PO TBEC
40.0000 mg | DELAYED_RELEASE_TABLET | Freq: Every day | ORAL | Status: DC
  Administered 2014-07-31 – 2014-08-03 (×4): 40 mg via ORAL
  Filled 2014-07-31 (×8): qty 1

## 2014-07-31 MED ORDER — SUCRALFATE 1 GM/10ML PO SUSP
1.0000 g | Freq: Three times a day (TID) | ORAL | Status: DC
  Administered 2014-07-31 – 2014-08-03 (×11): 1 g via ORAL
  Filled 2014-07-31 (×23): qty 10

## 2014-07-31 NOTE — [facility] Group Notes (Signed)
[facility]  07/31/2014 8:45 AM  Pt did not attend, declined invitation.   Chad CordialLauren Carter, LCSWA 07/31/2014 10:07 AM

## 2014-07-31 NOTE — Progress Notes (Signed)
Recreation Therapy Notes  Date: 07.06.16 Time: 930 am Location: 300 Hall Group Room  Group Topic: Stress Management  Goal Area(s) Addresses:  Patient will verbalize importance of using healthy stress management.  Patient will identify positive emotions associated with healthy stress management.   Intervention: Stress Management  Activity :  Guided Imagery.  LRT introduced and educated patients on the stress management technique of guided imagery.  A script was used to deliver the technique to patients.  Patients were asked to follow the script read a loud by LRT to engage in practicing the stress management technique.  Education:  Stress Management, Discharge Planning.   Education Outcome: Acknowledges edcuation/In group clarification offered/Needs additional education  Clinical Observations/Feedback: Did not attend group.    Caroll RancherMarjette Ayeisha Lindenberger, LRT/CTRS         Caroll RancherLindsay, Ambria Mayfield A 07/31/2014 3:46 PM

## 2014-07-31 NOTE — [facility] Group Notes (Signed)
[facility] LCSW Group Therapy 07/31/2014 1:15 PM  Type of Therapy: Group Therapy- Emotion Regulation  Pt did not attend, declined invitation.   Chad CordialLauren Carter, LCSWA 07/31/2014 4:08 PM

## 2014-07-31 NOTE — Progress Notes (Signed)
Pt was in the dayroom for evening group when he came out and had to go to his room to vomit.  He reports he has felt sick all day, but was able to go to meals today.  MHT witnessed him actively vomiting.  He was in the bed asleep until the group began.  He still has passive suicidal thoughts, but can contract for safety.  He denies HI/AVH at this time.  He has attended some groups, but says he hurts most of the time from his chronic back pain and the inguinal hernia on his R side that needs to be repaired.  He says the pain is what keeps him drinking and makes him irritable.  Pt's discharge plans are still in process.  Support and encouragement offered.  PA was informed of pt's nausea and orders were received.  Pt was medicated per orders.  Support and encouragement offered.  Pt makes his needs known to staff.  Safety maintained with q15 minute checks.

## 2014-07-31 NOTE — Progress Notes (Signed)
RN 2300 Shift  D: Pt in bed resting with eyes closed. Respirations even and unlabored. Pt appears to be in no signs of distress at this time. A: Q15min checks remains for this pt. R: Pt remains safe at this time.   

## 2014-07-31 NOTE — Progress Notes (Signed)
Patient ID: Lonnie [name], male   DOB: 02/10/1965, 49 y.o.   MRN: [id number] D: Patient deniesHI and auditory and visual hallucinations. States he has SI most of the time. Contracts for safety. Rates depression 10 hopelessness 10 and anxiety 10. Has had pain rating of 9 to 10 in groin most of the day.Patient has a depressed mood and affect. Concerned and upset he has been unable to have surgery to repair hernia.   A: Patient given emotional support from RN. Patient given medications per MD orders. Patient encouraged to attend groups and unit activities. Patient encouraged to come to staff with any questions or concerns. Patient asked this writer to speak with his aunt while she was on the phone with him. Spoke with aunt regarding need for surgery. Celine Ahr. [facility].  R: Patient remains cooperative and appropriate. Will continue to monitor patient for safety.

## 2014-07-31 NOTE — Progress Notes (Signed)
[facility] MD Progress Note  07/31/2014 9:02 PM [name]  MRN:  [id number] Subjective:  Lonnie [name] continues to have a hard time. He is still dealing with the pain and the physical limitations that come with having the inguinal hernia. States that this is right now the source of his stress. He states the pain triggers his drinking. He feels stuck while he has the hernia there is not much he can do physically wise Principal Problem: MDD (major depressive disorder), recurrent, severe, with psychosis Diagnosis:   Patient Active Problem List   Diagnosis Date Noted  . MDD (major depressive disorder), recurrent, severe, with psychosis [F33.3] 07/28/2014  . Slurred speech [R47.81] 08/09/2013  . TIA (transient ischemic attack) [G45.9] 08/09/2013  . Polyuria [R35.8] 08/09/2013  . Hematemesis [K92.0] 07/30/2013  . GI bleed [K92.2] 07/30/2013  . Schizoaffective disorder [F25.9] 07/29/2013  . Major depression, recurrent [F33.9] 07/25/2013  . Cocaine dependence [F14.20] 07/25/2013  . Alcohol dependence with alcohol-induced mood disorder [F10.24] 07/24/2013  . Depression, major, recurrent [F33.9] 05/17/2013  . Bilateral foot pain [id number], M79.672] 11/25/2011  . Chronic low back pain [M54.5, G89.29] 10/15/2011   Total Time spent with patient: 30 minutes   Past Medical History:  Past Medical History  Diagnosis Date  . GERD (gastroesophageal reflux disease)   . Depression   . Lumbago   . ETOH abuse   . Cocaine abuse   . Xanax use disorder, mild, abuse   . Marijuana abuse   . Schizophrenia   . Hypertension     pt reports that he has hx    Past Surgical History  Procedure Laterality Date  . Back surgery    . Neck surgery    . Esophagogastroduodenoscopy (egd) with propofol N/A 07/30/2013    Procedure: ESOPHAGOGASTRODUODENOSCOPY (EGD) WITH PROPOFOL;  Surgeon: Barrie Folk, MD;  Location: [facility];  Service: Endoscopy;  Laterality: N/A;   Family History:  Family History  Problem Relation Age  of Onset  . Diabetes Mother   . Hypertension Mother   . Hyperlipidemia Father   . Heart attack Neg Hx   . Sudden death Neg Hx    Social History:  History  Alcohol Use  . Yes     History  Drug Use  . Yes  . Special: Cocaine    History   Social History  . Marital Status: Single    Spouse Name: N/A  . Number of Children: N/A  . Years of Education: N/A   Social History Main Topics  . Smoking status: Current Every Day Smoker -- 0.00 packs/day for 0 years    Types: Cigarettes    Last Attempt to Quit: 08/08/2012  . Smokeless tobacco: Never Used  . Alcohol Use: Yes  . Drug Use: Yes    Special: Cocaine  . Sexual Activity: Not on file     Comment: sober x 7 months   Other Topics Concern  . None   Social History Narrative   Pt reports that he lives with his partner and their kids near [place] until recently moving to the [facility] house a week ago for residential substance abuse treatment. He reports a history of smoking cigarettes and significant alcohol consumption but has stopped since admission to [facility]. He has reports being clean from cocaine for the last 23 days. He has been unemployed for the past 3 years.    Additional History:    Sleep: Fair  Appetite:  Poor   Assessment:   Musculoskeletal: Strength & Muscle  Tone: within normal limits Gait & Station: walks bent over due to the hernia Patient leans: Front   Psychiatric Specialty Exam: Physical Exam  Review of Systems  Constitutional: Negative.   HENT: Negative.   Respiratory: Negative.   Cardiovascular: Negative.   Gastrointestinal: Positive for abdominal pain.       Inguinal hernia  Genitourinary: Negative.   Musculoskeletal: Negative.   Skin: Negative.   Neurological: Negative.   Endo/Heme/Allergies: Negative.   Psychiatric/Behavioral: Positive for depression and substance abuse. The patient is nervous/anxious.     Blood pressure 138/92, pulse 88, temperature 97.7 F (36.5 C), temperature  source Oral, resp. rate 18, height 5' 9.25" (1.759 m), weight 79.379 kg (175 lb), SpO2 100 %.Body mass index is 25.66 kg/(m^2).  General Appearance: Fairly Groomed  Patent attorneyye Contact::  Fair  Speech:  Clear and Coherent and Slow  Volume:  Decreased  Mood:  Anxious, Depressed and in pain  Affect:  Restricted  Thought Process:  Coherent and Goal Directed  Orientation:  Full (Time, Place, and Person)  Thought Content:  symptoms evnets worries concerns  Suicidal Thoughts:  No  Homicidal Thoughts:  No  Memory:  Immediate;   Fair Recent;   Fair Remote;   Fair  Judgement:  Fair  Insight:  Present  Psychomotor Activity:  Restlessness  Concentration:  Fair  Recall:  FiservFair  Fund of Knowledge:Fair  Language: Fair  Akathisia:  No  Handed:  Right  AIMS (if indicated):     Assets:  Desire for Improvement  ADL's:  Intact  Cognition: WNL  Sleep:  Number of Hours: 6.5     Current Medications: Current Facility-Administered Medications  Medication Dose Route Frequency Provider Last Rate Last Dose  . acetaminophen (TYLENOL) tablet 650 mg  650 mg Oral Q6H PRN Tamala JulianNeil T Mashburn, PA-C      . alum & mag hydroxide-simeth (MAALOX/MYLANTA) 200-200-20 MG/5ML suspension 30 mL  30 mL Oral Q4H PRN Tamala JulianNeil T Mashburn, PA-C   30 mL at 07/31/14 1704  . chlordiazePOXIDE (LIBRIUM) capsule 25 mg  25 mg Oral BH-qamhs Rachael FeeIrving A Mali Eppard, MD   25 mg at 07/31/14 0818   Followed by  . [START ON 08/01/2014] chlordiazePOXIDE (LIBRIUM) capsule 25 mg  25 mg Oral Daily Rachael FeeIrving A Airis Barbee, MD      . HYDROcodone-acetaminophen (NORCO/VICODIN) 5-325 MG per tablet 1 tablet  1 tablet Oral Q4H PRN Rachael FeeIrving A Katty Fretwell, MD   1 tablet at 07/31/14 1704  . magnesium hydroxide (MILK OF MAGNESIA) suspension 30 mL  30 mL Oral Daily PRN Tamala JulianNeil T Mashburn, PA-C   30 mL at 07/30/14 0523  . multivitamin with minerals tablet 1 tablet  1 tablet Oral Daily Rachael FeeIrving A Dinero Chavira, MD   1 tablet at 07/31/14 [phone]  . nicotine (NICODERM CQ - dosed in mg/24 hours) patch 21 mg  21 mg  Transdermal Daily Rachael FeeIrving A Tashawna Thom, MD   21 mg at 07/30/14 0810  . ondansetron (ZOFRAN-ODT) disintegrating tablet 8 mg  8 mg Oral Q8H PRN Kerry HoughSpencer E Simon, PA-C      . pantoprazole (PROTONIX) EC tablet 40 mg  40 mg Oral Daily Kerry HoughSpencer E Simon, PA-C      . QUEtiapine (SEROQUEL) tablet 200 mg  200 mg Oral QHS Rachael FeeIrving A Cypress Fanfan, MD   200 mg at 07/30/14 2120  . sucralfate (CARAFATE) 1 GM/10ML suspension 1 g  1 g Oral TID WC & HS Kerry HoughSpencer E Simon, PA-C        Lab Results: No results  found for this or any previous visit (from the past 48 hour(s)).  Physical Findings: AIMS: Facial and Oral Movements Muscles of Facial Expression: None, normal Lips and Perioral Area: None, normal Jaw: None, normal Tongue: None, normal,Extremity Movements Upper (arms, wrists, hands, fingers): None, normal Lower (legs, knees, ankles, toes): None, normal, Trunk Movements Neck, shoulders, hips: None, normal, Overall Severity Severity of abnormal movements (highest score from questions above): None, normal Incapacitation due to abnormal movements: None, normal Patient's awareness of abnormal movements (rate only patient's report): No Awareness, Dental Status Current problems with teeth and/or dentures?: No Does patient usually wear dentures?: No  CIWA:  CIWA-Ar Total: 0 COWS:  COWS Total Score: 0  Treatment Plan Summary: Daily contact with patient to assess and evaluate symptoms and progress in treatment and Medication management Supportive approach/coping skills Alcohol dependence; continue the detox protocol Work a relapse prevention plan Mood instability; will continue the Seroquel 200 mg HS Pain; will continue to explore ways he can have the surgery done CBT/mindfullness Medical Decision Making:  Review of Psycho-Social Stressors (1), Review of Medication Regimen & Side Effects (2) and Review of New Medication or Change in Dosage (2)     Wanda Rideout A 07/31/2014, 9:02 PM

## 2014-08-01 ENCOUNTER — Encounter (HOSPITAL_COMMUNITY): Payer: Self-pay | Admitting: Emergency Medicine

## 2014-08-01 ENCOUNTER — Inpatient Hospital Stay (HOSPITAL_COMMUNITY): Payer: Federal, State, Local not specified - Other

## 2014-08-01 LAB — BASIC METABOLIC PANEL
Anion gap: 5 (ref 5–15)
BUN: 16 mg/dL (ref 6–20)
CALCIUM: 8.4 mg/dL — AB (ref 8.9–10.3)
CO2: 25 mmol/L (ref 22–32)
Chloride: 109 mmol/L (ref 101–111)
Creatinine, Ser: 1.2 mg/dL (ref 0.61–1.24)
GFR calc Af Amer: >60 mL/min (ref >60)
Glucose, Bld: 112 mg/dL — ABNORMAL HIGH (ref 65–99)
POTASSIUM: 4.7 mmol/L (ref 3.5–5.1)
SODIUM: 139 mmol/L (ref 135–145)

## 2014-08-01 LAB — CBC
HEMATOCRIT: 39.5 % (ref 39.0–52.0)
Hemoglobin: 13 g/dL (ref 13.0–17.0)
MCH: 28.1 pg (ref 26.0–34.0)
MCHC: 32.9 g/dL (ref 30.0–36.0)
MCV: 85.5 fL (ref 78.0–100.0)
Platelets: 260 10*3/uL (ref 150–400)
RBC: 4.62 MIL/uL (ref 4.22–5.81)
RDW: 14.5 % (ref 11.5–15.5)
WBC: 6.2 10*3/uL (ref 4.0–10.5)

## 2014-08-01 MED ORDER — IOHEXOL 300 MG/ML  SOLN
25.0000 mL | Freq: Once | INTRAMUSCULAR | Status: AC | PRN
  Administered 2014-08-01: 25 mL via ORAL

## 2014-08-01 MED ORDER — IOHEXOL 300 MG/ML  SOLN
100.0000 mL | Freq: Once | INTRAMUSCULAR | Status: AC | PRN
  Administered 2014-08-01: 100 mL via INTRAVENOUS

## 2014-08-01 MED ORDER — TRAMADOL HCL 50 MG PO TABS
50.0000 mg | ORAL_TABLET | Freq: Once | ORAL | Status: AC
  Administered 2014-08-01: 50 mg via ORAL
  Filled 2014-08-01: qty 1

## 2014-08-01 NOTE — Tx Team (Signed)
Initial Interdisciplinary Treatment Plan   PATIENT STRESSORS: Financial difficulties Health problems Substance abuse Traumatic event   PATIENT STRENGTHS: Average or above average intelligence Capable of independent living General fund of knowledge Motivation for treatment/growth   PROBLEM LIST: Problem List/Patient Goals Date to be addressed Date deferred Reason deferred Estimated date of resolution  Depression       Risk for self harm      Poly sub abuse      Health issue- inguinal hernia that needs repair            "I need help with my depression"      "I need help getting surgery for my hernia"                   DISCHARGE CRITERIA:  Improved stabilization in mood, thinking, and/or behavior Motivation to continue treatment in a less acute level of care Need for constant or close observation no longer present Verbal commitment to aftercare and medication compliance Withdrawal symptoms are absent or subacute and managed without 24-hour nursing intervention  PRELIMINARY DISCHARGE PLAN: Attend aftercare/continuing care group Attend 12-step recovery group Outpatient therapy Placement in alternative living arrangements  PATIENT/FAMIILY INVOLVEMENT: This treatment plan has been presented to and reviewed with the patient, Lonnie [name], and/or family member.  The patient and family have been given the opportunity to ask questions and make suggestions.  Lonnie [name], Lonnie [name] 08/01/2014, 3:24 AM

## 2014-08-01 NOTE — Progress Notes (Signed)
[facility]

## 2014-08-01 NOTE — [facility] Group Notes (Signed)
[facility] Group Notes:  (Nursing/MHT/Case Management/Adjunct)  Date:  08/01/2014  Time:  0900am  Type of Therapy:  Nurse Education  Participation Level:  Minimal  Participation Quality:  Drowsy  Affect:  Blunted  Cognitive:  Lacking  Insight:  Lacking  Engagement in Group:  Lacking  Modes of Intervention:  Discussion, Education and Support  Summary of Progress/Problems: Patient attended group. Pt's attention was minimal and pt appeared drowsy. Pt left group early to use the restroom.  Lendell CapriceGuthrie, Yvana Samonte A 08/01/2014, 9:46 AM

## 2014-08-01 NOTE — Progress Notes (Signed)
Report called to Charge RN [facility] at 1600pm with ED evaluation order. [facility] transportation contacted for transport. Ok to send pt via [facility] per Julieanne CottonAC Tina and MD [place]ug[place] approval.

## 2014-08-01 NOTE — ED Provider Notes (Signed)
CSN: [id number]     Arrival date & time 08/01/14  1710 History   First MD Initiated Contact with Patient 08/01/14 1802     Chief Complaint  Patient presents with  . Hernia     (Consider location/radiation/quality/duration/timing/severity/associated sxs/prior Treatment) The history is provided by the patient and medical records. No language interpreter was used.     Lonnie [name] is a 49 y.o. male  with a hx of GERD, depression, chronic low back pain, polysubstance abuse, HTN presents to the Emergency Department complaining of gradual, persistent, progressively worsening right inguinal hernia onset 3 years ago but worsening in the last 1 week.  Pt reports he was taking Vicodin but now is only taking Advil.  He reports pain in the hernia during urination and defecation.  He also reports pain with walking.  Pt reports the lump is "hard" now for the past 3 days with vomiting yesterday.  Pt's last oral intake was 12:00pm. Nothing makes the pain better.  Pt denies fever, chills, headache, neck pain, chest pain, SOB, diarrhea, weakness, dizziness, syncope, hematuria, melena, hematochezia.   Patient reports he has been unable to follow-up with surgery due to insurance issues.   Past Medical History  Diagnosis Date  . GERD (gastroesophageal reflux disease)   . Depression   . Lumbago   . ETOH abuse   . Cocaine abuse   . Xanax use disorder, mild, abuse   . Marijuana abuse   . Schizophrenia   . Hypertension     pt reports that he has hx   Past Surgical History  Procedure Laterality Date  . Back surgery    . Neck surgery    . Esophagogastroduodenoscopy (egd) with propofol N/A 07/30/2013    Procedure: ESOPHAGOGASTRODUODENOSCOPY (EGD) WITH PROPOFOL;  Surgeon: Barrie Folk, MD;  Location: [facility];  Service: Endoscopy;  Laterality: N/A;   Family History  Problem Relation Age of Onset  . Diabetes Mother   . Hypertension Mother   . Hyperlipidemia Father   . Heart attack Neg Hx   . Sudden  death Neg Hx    History  Substance Use Topics  . Smoking status: Current Every Day Smoker -- 0.00 packs/day for 0 years    Types: Cigarettes    Last Attempt to Quit: 08/08/2012  . Smokeless tobacco: Never Used  . Alcohol Use: Yes    Review of Systems  Constitutional: Negative for fever, diaphoresis, appetite change, fatigue and unexpected weight change.  HENT: Negative for mouth sores.   Eyes: Negative for visual disturbance.  Respiratory: Negative for cough, chest tightness, shortness of breath and wheezing.   Cardiovascular: Negative for chest pain.  Gastrointestinal: Positive for nausea, vomiting and abdominal pain. Negative for diarrhea and constipation.  Endocrine: Negative for polydipsia, polyphagia and polyuria.  Genitourinary: Positive for testicular pain. Negative for dysuria, urgency, frequency and hematuria.  Musculoskeletal: Negative for back pain and neck stiffness.  Skin: Negative for rash.  Allergic/Immunologic: Negative for immunocompromised state.  Neurological: Negative for syncope, light-headedness and headaches.  Hematological: Does not bruise/bleed easily.  Psychiatric/Behavioral: Negative for sleep disturbance. The patient is not nervous/anxious.       Allergies  Ibuprofen and Morphine and related  Home Medications   Prior to Admission medications   Medication Sig Start Date End Date Taking? Authorizing Provider  meloxicam (MOBIC) 7.5 MG tablet Take 2 tablets (15 mg total) by mouth daily. 06/18/14   Eber Hong, MD  polyethylene glycol [facility] / Ethelene Hal) packet Take 17 g  by mouth daily as needed for mild constipation.    Historical Provider, MD  risperidone (RISPERDAL) 4 MG tablet Take 4 mg by mouth 2 (two) times daily.    Historical Provider, MD   BP 140/95 mmHg  Pulse 71  Temp(Src) 98.5 F (36.9 C) (Oral)  Resp 18  Ht 5' 9.25" (1.759 m)  Wt 175 lb (79.379 kg)  BMI 25.66 kg/m2  SpO2 100% Physical Exam  Constitutional: He appears  well-developed and well-nourished. No distress.  Awake, alert, nontoxic appearance  HENT:  Head: Normocephalic and atraumatic.  Mouth/Throat: Oropharynx is clear and moist. No oropharyngeal exudate.  Eyes: Conjunctivae are normal. No scleral icterus.  Neck: Normal range of motion. Neck supple.  Cardiovascular: Normal rate, regular rhythm, normal heart sounds and intact distal pulses.   No murmur heard. Pulmonary/Chest: Effort normal and breath sounds normal. No respiratory distress. He has no wheezes.  Equal chest expansion  Abdominal: Soft. Bowel sounds are normal. He exhibits no distension and no mass. There is no tenderness. There is no rebound and no guarding. A hernia is present. Hernia confirmed positive in the right inguinal area.  Genitourinary: Testes normal and penis normal. Cremasteric reflex is present. Uncircumcised.  Right direct inguinal hernia; reducible on exam  Musculoskeletal: Normal range of motion. He exhibits no edema.  Lymphadenopathy:       Right: No inguinal adenopathy present.  Neurological: He is alert.  Speech is clear and goal oriented Moves extremities without ataxia  Skin: Skin is warm and dry. He is not diaphoretic.  Psychiatric: He has a normal mood and affect.  Nursing note and vitals reviewed.   ED Course  Procedures (including critical care time) Labs Review Labs Reviewed  BASIC METABOLIC PANEL - Abnormal; Notable for the following:    Glucose, Bld 112 (*)    Calcium 8.4 (*)    All other components within normal limits  CBC    Imaging Review Ct Abdomen Pelvis W Contrast  08/01/2014   CLINICAL DATA:  49 year old male with right inguinal pain  EXAM: CT ABDOMEN AND PELVIS WITH CONTRAST  TECHNIQUE: Multidetector CT imaging of the abdomen and pelvis was performed using the standard protocol following bolus administration of intravenous contrast.  CONTRAST:  25mL OMNIPAQUE IOHEXOL 300 MG/ML SOLN, OMNIPAQUE IOHEXOL 300 MG/ML SOLN  COMPARISON:   Radiograph dated 12/24/2013 and CT dated 11/10/2013  FINDINGS: Minimal bibasilar dependent atelectatic changes. The visualized lung bases are otherwise clear. The there is minimal left pleural thickening versus trace pleural effusion.  No intra-abdominal free air or free fluid.  The liver, gallbladder, pancreas, spleen, adrenal glands, kidneys, visualized ureters, and urinary bladder appear unremarkable. The prostate gland is within normal limits by size criteria.  There is no evidence of bowel obstruction. Moderate stool noted throughout the colon. The appendix is unremarkable.  Mild atherosclerotic calcification of the aorta. The origins of the celiac axis, SMA, and the IMA as well as the origins of the renal arteries are patent. No portal venous gas identified. There is no lymphadenopathy.  Small fat containing right inguinal hernia. There is no inflammatory changes of the herniated fat to suggest strangulation/incarceration. There is diastases of anterior abdominal wall musculature at the level of the umbilicus. Mild degenerative changes of the spine. No acute fracture.  IMPRESSION: Small fat containing right inguinal hernia without evidence of inflammation or strangulation. No acute intra-abdominal or pelvic pathology identified.   Electronically Signed   By: Elgie Collard M.D.   On: 08/01/2014 20:39  EKG Interpretation None      MDM   Final diagnoses:  RLQ abdominal pain  Right inguinal hernia  Right inguinal hernia  Right inguinal hernia   Apolinar JunesHoward J Car on exam patient with palpable direct right inguinal hernia. This is reducible however it causes patient's pain. No emesis in the department today.  Patient with history of polysubstance abuse including narcotic abuse history. Declines narcotic pain medicine at this time. Will obtain CT scan to confirm no incarceration or strangulation.  9:42 PM CT scan with small fat containing right inguinal hernia without evidence of inflammation or  strangulation.  Patient's pain controlled here in the emergency department. Labs reassuring. Patient tolerating by mouth here without difficulty.  Patient discharged back to behavioral health [facility] [place].  Given strict return precautions and symptoms of hernia strangulation.    BP 140/95 mmHg  Pulse 71  Temp(Src) 98.5 F (36.9 C) (Oral)  Resp 18  Ht 5' 9.25" (1.759 m)  Wt 175 lb (79.379 kg)  BMI 25.66 kg/m2  SpO2 100%   Dierdre ForthHannah Latanya Hemmer, PA-C 08/01/14 2147  Lorre NickAnthony Allen, MD 08/02/14 [phone]

## 2014-08-01 NOTE — ED Notes (Signed)
[facility] transport arrived to transport pt with the tech

## 2014-08-01 NOTE — ED Notes (Signed)
Called [facility] staff to give report, awaiting call back at this time to confirm bed number for the pt to return back to. Will call [facility] transport when bed has been confirmed.

## 2014-08-01 NOTE — Progress Notes (Signed)
[facility] at [facility].

## 2014-08-01 NOTE — Plan of Care (Signed)
Problem: Diagnosis: Increased Risk For Suicide Attempt Goal: STG-Patient Will Attend All Groups On The Unit Outcome: Progressing Patient is attending some unit groups today. Goal: STG-Patient Will Comply With Medication Regime Outcome: Progressing Patient has adhered to medication regimen today with ease.  Problem: Ineffective individual coping Goal: STG: Patient will remain free from self harm Outcome: Progressing Patient remains free from self harm. 15 minute checks continued per protocol for patient safety.   Problem: Alteration in mood & ability to function due to Goal: LTG-Pt reports reduction in suicidal thoughts (Patient reports reduction in suicidal thoughts and is able to verbalize a safety plan for whenever patient is feeling suicidal)  Outcome: Progressing Patient denies having any suicidal thoughts today.

## 2014-08-01 NOTE — Discharge Instructions (Signed)
1. Medications: usual home medications 2. Treatment: rest, drink plenty of fluids,  3. Follow Up: Please followup with the [facility] as soon as able for discussion of your diagnoses and further evaluation after today's visit; if you do not have a primary care doctor use the resource guide provided to find one; Please return to the ER for worsening abdominal pain, persistent vomiting, high fevers or other concerns  Inguinal Hernia, Adult Muscles help keep everything in the body in its proper place. But if a weak spot in the muscles develops, something can poke through. That is called a hernia. When this happens in the lower part of the belly (abdomen), it is called an inguinal hernia. (It takes its name from a part of the body in this region called the inguinal canal.) A weak spot in the wall of muscles lets some fat or part of the small intestine bulge through. An inguinal hernia can develop at any age. Men get them more often than women. CAUSES  In adults, an inguinal hernia develops over time.  It can be triggered by:  Suddenly straining the muscles of the lower abdomen.  Lifting heavy objects.  Straining to have a bowel movement. Difficult bowel movements (constipation) can lead to this.  Constant coughing. This may be caused by smoking or lung disease.  Being overweight.  Being pregnant.  Working at a job that requires long periods of standing or heavy lifting.  Having had an inguinal hernia before. One type can be an emergency situation. It is called a strangulated inguinal hernia. It develops if part of the small intestine slips through the weak spot and cannot get back into the abdomen. The blood supply can be cut off. If that happens, part of the intestine may die. This situation requires emergency surgery. SYMPTOMS  Often, a small inguinal hernia has no symptoms. It is found when a healthcare provider does a physical exam. Larger hernias usually have  symptoms.   In adults, symptoms may include:  A lump in the groin. This is easier to see when the person is standing. It might disappear when lying down.  In men, a lump in the scrotum.  Pain or burning in the groin. This occurs especially when lifting, straining or coughing.  A dull ache or feeling of pressure in the groin.  Signs of a strangulated hernia can include:  A bulge in the groin that becomes very painful and tender to the touch.  A bulge that turns red or purple.  Fever, nausea and vomiting.  Inability to have a bowel movement or to pass gas. DIAGNOSIS  To decide if you have an inguinal hernia, a healthcare provider will probably do a physical examination.  This will include asking questions about any symptoms you have noticed.  The healthcare provider might feel the groin area and ask you to cough. If an inguinal hernia is felt, the healthcare provider may try to slide it back into the abdomen.  Usually no other tests are needed. TREATMENT  Treatments can vary. The size of the hernia makes a difference. Options include:  Watchful waiting. This is often suggested if the hernia is small and you have had no symptoms.  No medical procedure will be done unless symptoms develop.  You will need to watch closely for symptoms. If any occur, contact your healthcare provider right away.  Surgery. This is used if the hernia is larger or you have symptoms.  Open surgery. This is usually  an outpatient procedure (you will not stay overnight in a hospital). An cut (incision) is made through the skin in the groin. The hernia is put back inside the abdomen. The weak area in the muscles is then repaired by herniorrhaphy or hernioplasty. Herniorrhaphy: in this type of surgery, the weak muscles are sewn back together. Hernioplasty: a patch or mesh is used to close the weak area in the abdominal wall.  Laparoscopy. In this procedure, a surgeon makes small incisions. A thin tube with  a tiny video camera (called a laparoscope) is put into the abdomen. The surgeon repairs the hernia with mesh by looking with the video camera and using two long instruments. HOME CARE INSTRUCTIONS   After surgery to repair an inguinal hernia:  You will need to take pain medicine prescribed by your healthcare provider. Follow all directions carefully.  You will need to take care of the wound from the incision.  Your activity will be restricted for awhile. This will probably include no heavy lifting for several weeks. You also should not do anything too active for a few weeks. When you can return to work will depend on the type of job that you have.  During "watchful waiting" periods, you should:  Maintain a healthy weight.  Eat a diet high in fiber (fruits, vegetables and whole grains).  Drink plenty of fluids to avoid constipation. This means drinking enough water and other liquids to keep your urine clear or pale yellow.  Do not lift heavy objects.  Do not stand for long periods of time.  Quit smoking. This should keep you from developing a frequent cough. SEEK MEDICAL CARE IF:   A bulge develops in your groin area.  You feel pain, a burning sensation or pressure in the groin. This might be worse if you are lifting or straining.  You develop a fever of more than 100.5 F (38.1 C). SEEK IMMEDIATE MEDICAL CARE IF:   Pain in the groin increases suddenly.  A bulge in the groin gets bigger suddenly and does not go down.  For men, there is sudden pain in the scrotum. Or, the size of the scrotum increases.  A bulge in the groin area becomes red or purple and is painful to touch.  You have nausea or vomiting that does not go away.  You feel your heart beating much faster than normal.  You cannot have a bowel movement or pass gas.  You develop a fever of more than 102.0 F (38.9 C). Document Released: 05/30/2008 Document Revised: 04/05/2011 Document Reviewed:  05/30/2008 [facility] Patient Information 2015 [place], [place]. This information is not intended to replace advice given to you by your health care provider. Make sure you discuss any questions you have with your health care provider.

## 2014-08-01 NOTE — Progress Notes (Signed)
Pt was unable to attend karaoke group because he was at [facility].

## 2014-08-01 NOTE — Progress Notes (Signed)
D: Patient is alert and oriented. Pt's mood and affect is depressed and blunted. Pt denies SI/HI and VH. Pt appears drowsy this morning. Pt states "Mikey kept me up last night, my imaginary friend." Pt reports he "always" hears voices. Pt C/O right inguinal pain 10/10 throughout the day which decreases some with PRN medication. Pt is attending unit groups today. A: Active listening by RN. Encouragement/Support provided to pt. Heat pack applied to help relieve pain. Rest/Relaxation encouraged. MD AfghanistanLugo made aware of pt's continued pain level. Medication education reviewed with pt. PRN medication administered for pain per providers orders (See MAR). Scheduled medications administered per providers orders (See MAR). 15 minute checks continued per protocol for patient safety.  R: Patient cooperative and receptive to nursing interventions. Pt remains safe.

## 2014-08-01 NOTE — ED Notes (Signed)
Bed: [facility] Expected date:  Expected time:  Means of arrival:  Comments: [facility] PT

## 2014-08-01 NOTE — Progress Notes (Signed)
[facility] MD Progress Note  08/01/2014 3:57 PM Lonnie [name]  MRN:  [id number] Subjective:  Lonnie [name] complains of persistent pain in the right inguinal area. States pain has increased trough the day, he complains of nausea was throwing up last night. States the area where he has the hernia is burning. He did not sleep last night and is getting little relief from pain management. Principal Problem: MDD (major depressive disorder), recurrent, severe, with psychosis Diagnosis:   Patient Active Problem List   Diagnosis Date Noted  . MDD (major depressive disorder), recurrent, severe, with psychosis [F33.3] 07/28/2014  . Slurred speech [R47.81] 08/09/2013  . TIA (transient ischemic attack) [G45.9] 08/09/2013  . Polyuria [R35.8] 08/09/2013  . Hematemesis [K92.0] 07/30/2013  . GI bleed [K92.2] 07/30/2013  . Schizoaffective disorder [F25.9] 07/29/2013  . Major depression, recurrent [F33.9] 07/25/2013  . Cocaine dependence [F14.20] 07/25/2013  . Alcohol dependence with alcohol-induced mood disorder [F10.24] 07/24/2013  . Depression, major, recurrent [F33.9] 05/17/2013  . Bilateral foot pain [id number], M79.672] 11/25/2011  . Chronic low back pain [M54.5, G89.29] 10/15/2011   Total Time spent with patient: 30 minutes   Past Medical History:  Past Medical History  Diagnosis Date  . GERD (gastroesophageal reflux disease)   . Depression   . Lumbago   . ETOH abuse   . Cocaine abuse   . Xanax use disorder, mild, abuse   . Marijuana abuse   . Schizophrenia   . Hypertension     pt reports that he has hx    Past Surgical History  Procedure Laterality Date  . Back surgery    . Neck surgery    . Esophagogastroduodenoscopy (egd) with propofol N/A 07/30/2013    Procedure: ESOPHAGOGASTRODUODENOSCOPY (EGD) WITH PROPOFOL;  Surgeon: Barrie Folk, MD;  Location: [facility];  Service: Endoscopy;  Laterality: N/A;   Family History:  Family History  Problem Relation Age of Onset  . Diabetes Mother   .  Hypertension Mother   . Hyperlipidemia Father   . Heart attack Neg Hx   . Sudden death Neg Hx    Social History:  History  Alcohol Use  . Yes     History  Drug Use  . Yes  . Special: Cocaine    History   Social History  . Marital Status: Single    Spouse Name: N/A  . Number of Children: N/A  . Years of Education: N/A   Social History Main Topics  . Smoking status: Current Every Day Smoker -- 0.00 packs/day for 0 years    Types: Cigarettes    Last Attempt to Quit: 08/08/2012  . Smokeless tobacco: Never Used  . Alcohol Use: Yes  . Drug Use: Yes    Special: Cocaine  . Sexual Activity: Not on file     Comment: sober x 7 months   Other Topics Concern  . None   Social History Narrative   Pt reports that he lives with his partner and their kids near [place] until recently moving to the [facility] house a week ago for residential substance abuse treatment. He reports a history of smoking cigarettes and significant alcohol consumption but has stopped since admission to [facility]. He has reports being clean from cocaine for the last 23 days. He has been unemployed for the past 3 years.    Additional History:    Sleep: Poor  Appetite:  Poor   Assessment:   Musculoskeletal: Strength & Muscle Tone: within normal limits Gait & Station: bending over Patient  leans: Front   Psychiatric Specialty Exam: Physical Exam  ROS  Blood pressure 139/86, pulse 88, temperature 97.7 F (36.5 C), temperature source Oral, resp. rate 17, height 5' 9.25" (1.759 m), weight 79.379 kg (175 lb), SpO2 100 %.Body mass index is 25.66 kg/(m^2).  General Appearance: Fairly Groomed  Patent attorney::  Fair  Speech:  Clear and Coherent  Volume:  Decreased  Mood:  Anxious, Depressed and Dysphoric  Affect:  in pain  Thought Process:  Coherent and Goal Directed  Orientation:  Full (Time, Place, and Person)  Thought Content:  deals with the pain  Suicidal Thoughts:  No Just wants help  Homicidal  Thoughts:  No  Memory:  Immediate;   Fair Recent;   Fair Remote;   Fair  Judgement:  Fair  Insight:  Present  Psychomotor Activity:  Restlessness  Concentration:  Fair  Recall:  [employer] of Knowledge:Fair  Language: Fair  Akathisia:  No  Handed:  Right  AIMS (if indicated):     Assets:  Desire for Improvement Social Support  ADL's:  Intact  Cognition: WNL  Sleep:  Number of Hours: 6     Current Medications: Current Facility-Administered Medications  Medication Dose Route Frequency Provider Last Rate Last Dose  . acetaminophen (TYLENOL) tablet 650 mg  650 mg Oral Q6H PRN Tamala Julian, PA-C      . alum & mag hydroxide-simeth (MAALOX/MYLANTA) 200-200-20 MG/5ML suspension 30 mL  30 mL Oral Q4H PRN Tamala Julian, PA-C   30 mL at 07/31/14 1704  . HYDROcodone-acetaminophen (NORCO/VICODIN) 5-325 MG per tablet 1 tablet  1 tablet Oral Q4H PRN Rachael Fee, MD   1 tablet at 08/01/14 1449  . magnesium hydroxide (MILK OF MAGNESIA) suspension 30 mL  30 mL Oral Daily PRN Tamala Julian, PA-C   30 mL at 07/31/14 2122  . multivitamin with minerals tablet 1 tablet  1 tablet Oral Daily Rachael Fee, MD   1 tablet at 08/01/14 0810  . nicotine (NICODERM CQ - dosed in mg/24 hours) patch 21 mg  21 mg Transdermal Daily Rachael Fee, MD   21 mg at 08/01/14 0809  . ondansetron (ZOFRAN-ODT) disintegrating tablet 8 mg  8 mg Oral Q8H PRN Kerry Hough, PA-C      . pantoprazole (PROTONIX) EC tablet 40 mg  40 mg Oral Daily Kerry Hough, PA-C   40 mg at 08/01/14 0810  . QUEtiapine (SEROQUEL) tablet 200 mg  200 mg Oral QHS Rachael Fee, MD   200 mg at 07/31/14 2120  . sucralfate (CARAFATE) 1 GM/10ML suspension 1 g  1 g Oral TID WC & HS Kerry Hough, PA-C   1 g at 08/01/14 1154    Lab Results: No results found for this or any previous visit (from the past 48 hour(s)).  Physical Findings: AIMS: Facial and Oral Movements Muscles of Facial Expression: None, normal Lips and Perioral Area:  None, normal Jaw: None, normal Tongue: None, normal,Extremity Movements Upper (arms, wrists, hands, fingers): None, normal Lower (legs, knees, ankles, toes): None, normal, Trunk Movements Neck, shoulders, hips: None, normal, Overall Severity Severity of abnormal movements (highest score from questions above): None, normal Incapacitation due to abnormal movements: None, normal Patient's awareness of abnormal movements (rate only patient's report): No Awareness, Dental Status Current problems with teeth and/or dentures?: No Does patient usually wear dentures?: No  CIWA:  CIWA-Ar Total: 3 COWS:  COWS Total Score: 0  Treatment Plan Summary: Daily  contact with patient to assess and evaluate symptoms and progress in treatment and Medication management Supportive approach/coping skills Will send to the ED to assess as the pain has gotten progressively worst.  Hopefully they can schedule surgery. He had a pre op in May and could not have surgery as he did not have the means to afford it Medical Decision Making:  Review of Psycho-Social Stressors (1), Review of Medication Regimen & Side Effects (2) and Review of New Medication or Change in Dosage (2)     Carmello Cabiness A 08/01/2014, 3:57 PM

## 2014-08-01 NOTE — ED Notes (Signed)
Returned from ct 

## 2014-08-02 MED ORDER — QUETIAPINE FUMARATE 100 MG PO TABS
100.0000 mg | ORAL_TABLET | Freq: Once | ORAL | Status: AC
Start: 2014-08-02 — End: 2014-08-02
  Administered 2014-08-02: 100 mg via ORAL
  Filled 2014-08-02 (×2): qty 1

## 2014-08-02 MED ORDER — DIAZEPAM 5 MG PO TABS
5.0000 mg | ORAL_TABLET | Freq: Four times a day (QID) | ORAL | Status: DC | PRN
  Administered 2014-08-02 – 2014-08-03 (×5): 5 mg via ORAL
  Filled 2014-08-02 (×5): qty 1

## 2014-08-02 NOTE — Progress Notes (Signed)
Pt was invited to group, however was in pain and stayed in his room with nurse's permission.

## 2014-08-02 NOTE — Care Management (Signed)
[facility]:  [facility] [place], [place] [facility] indicating patient needing hospital follow-up appointment to for inguinal hernia. Patient does not have a PCP. Patient may need surgery referral. Appointment obtained on 08/06/14 at 1200 with Scot Juniffany Noel, PA.  Anne updated on appointment.

## 2014-08-02 NOTE — Tx Team (Signed)
Interdisciplinary Treatment Plan Update (Adult)  Date:  08/02/2014 Time Reviewed:  8:55 AM  Progress in Treatment: Attending groups: Yes. Participating in groups:  Yes. Taking medication as prescribed:  Yes. Tolerating medication:  Yes. Family/Significant othe contact made:  No, will contact:  family w patient permission Patient understands diagnosis:  Yes. Discussing patient identified problems/goals with staff:  Yes. Medical problems stabilized or resolved:  No. Denies suicidal/homicidal ideation: Yes. Issues/concerns per patient self-inventory:  No. Other:  New problem(s) identified: Yes, Describe:  patient has severe pain due to hernia that needs surgical correction, pain contributes to depression and mood disorder as well as substance use  Discharge Plan or Barriers:  Lack of insurance for medical follow up needed  Reason for Continuation of Hospitalization: Medication stabilization Withdrawal symptoms  Comments: 07/30/14: Patient has persistent sadness and SI, recent divorce and child custody/visitation issues, recent job loss.  Rumination, depressed mood. Major pain due to needed surgery for hernia - cannot afford, has no insurance.    Estimated length of stay:  3 - 5 days  New goal(s):  Referral to medical facility for tx of painful hernia, patient has had preop visit for surgical repair, cannot afford surgery cost  Review of initial/current patient goals per problem list:  See initial plan of care   Attendees: Patient:   7/8/20168:55 AM  Family:   7/8/20168:55 AM  Physician:  Jola BaptistI Lugo MD 7/8/20168:55 AM  Nursing:   Sue LushAndrea RN 7/8/20168:55 AM  Case Manager:  Santa GeneraAnne Siraj Dermody  LCSW 7/8/20168:55 AM  Counselor:   7/8/20168:55 AM  Other:  Sherron FlemingsVal Enoch Monarch TCT 7/8/20168:55 AM  Other:  Sondra BargesJ Clark, RN UR 7/8/20168:55 AM  Other:   7/8/20168:55 AM  Other:  7/8/20168:55 AM  Other:  7/8/20168:55 AM  Other:  7/8/20168:55 AM  Other:  7/8/20168:55 AM  Other:  7/8/20168:55 AM   Other:  7/8/20168:55 AM  Other:   7/8/20168:55 AM   Scribe for Treatment Team:   Sallee Langeunningham, Delray Reza C, 08/02/2014, 8:55 AM

## 2014-08-02 NOTE — Progress Notes (Signed)
Pt returned to the unit around 2230 from the ED where he was evaluated for the groin pain from the inguinal hernia.  He states his aunt came to be with him while he was there.  He said that she was going to call and get him set up to have surgery as the ED gave them info as to how to be able to get the surgery done sooner.  Pt was hungry when he returned and ate two trays of food that was heated for him.  He was also given his meds and prn pain med.  He denies SI/HI/AVH at this time and is feeling more hopeful about his future since it looks like he will be able to have the surgery to repair the hernia.  Pt is pleasant and cooperative.  He makes his needs known to staff.  Pt voiced no other needs or concerns.  Support and encouragement offered.  Safety maintained with q15 minute checks.

## 2014-08-02 NOTE — Progress Notes (Signed)
Lonnie [name] Progress Note  08/02/2014 8:53 PM Lonnie [name]  MRN:  [id number] Subjective:  Lonnie [name] has continued to complain of pain but now he is more hopeful as he has been pointed in a better direction in terms of what to do to get his surgery done. He is still dealing with the mood instability. He talked some more about his "imaginary friend" who has help him cope through the years. . Principal Problem: MDD (major depressive disorder), recurrent, severe, with psychosis Diagnosis:   Patient Active Problem List   Diagnosis Date Noted  . MDD (major depressive disorder), recurrent, severe, with psychosis [F33.3] 07/28/2014  . Slurred speech [R47.81] 08/09/2013  . TIA (transient ischemic attack) [G45.9] 08/09/2013  . Polyuria [R35.8] 08/09/2013  . Hematemesis [K92.0] 07/30/2013  . GI bleed [K92.2] 07/30/2013  . Schizoaffective disorder [F25.9] 07/29/2013  . Major depression, recurrent [F33.9] 07/25/2013  . Cocaine dependence [F14.20] 07/25/2013  . Alcohol dependence with alcohol-induced mood disorder [F10.24] 07/24/2013  . Depression, major, recurrent [F33.9] 05/17/2013  . Bilateral foot pain [id number], M79.672] 11/25/2011  . Chronic low back pain [M54.5, G89.29] 10/15/2011   Total Time spent with patient: 30 minutes   Past Medical History:  Past Medical History  Diagnosis Date  . GERD (gastroesophageal reflux disease)   . Depression   . Lumbago   . ETOH abuse   . Cocaine abuse   . Xanax use disorder, mild, abuse   . Marijuana abuse   . Schizophrenia   . Hypertension     pt reports that he has hx    Past Surgical History  Procedure Laterality Date  . Back surgery    . Neck surgery    . Esophagogastroduodenoscopy (egd) with propofol N/A 07/30/2013    Procedure: ESOPHAGOGASTRODUODENOSCOPY (EGD) WITH PROPOFOL;  Surgeon: Missy Sabins, [name];  Location: [facility];  Service: Endoscopy;  Laterality: N/A;   Family History:  Family History  Problem Relation Age of Onset  . Diabetes  Mother   . Hypertension Mother   . Hyperlipidemia Father   . Heart attack Neg Hx   . Sudden death Neg Hx    Social History:  History  Alcohol Use  . Yes     History  Drug Use  . Yes  . Special: Cocaine    History   Social History  . Marital Status: Single    Spouse Name: N/A  . Number of Children: N/A  . Years of Education: N/A   Social History Main Topics  . Smoking status: Current Every Day Smoker -- 0.00 packs/day for 0 years    Types: Cigarettes    Last Attempt to Quit: 08/08/2012  . Smokeless tobacco: Never Used  . Alcohol Use: Yes  . Drug Use: Yes    Special: Cocaine  . Sexual Activity: Not on file     Comment: sober x 7 months   Other Topics Concern  . None   Social History Narrative   Pt reports that he lives with his partner and their kids near [place] until recently moving to the [facility] house a week ago for residential substance abuse treatment. He reports a history of smoking cigarettes and significant alcohol consumption but has stopped since admission to [facility]. He has reports being clean from cocaine for the last 23 days. He has been unemployed for the past 3 years.    Additional History:    Sleep: Fair  Appetite:  Fair   Assessment:   Musculoskeletal: Strength & Muscle Tone: within  normal limits Gait & Station: affected by his hernia pain Patient leans: Front   Psychiatric Specialty Exam: Physical Exam  Review of Systems  Constitutional: Positive for malaise/fatigue.  HENT: Negative.   Eyes: Negative.   Respiratory: Negative.   Cardiovascular: Negative.   Gastrointestinal:       Pain in his inguinal area  Genitourinary: Negative.   Musculoskeletal: Negative.   Skin: Negative.   Neurological: Negative.   Endo/Heme/Allergies: Negative.   Psychiatric/Behavioral: Positive for depression and substance abuse. The patient is nervous/anxious.     Blood pressure 150/86, pulse 86, temperature 97.7 F (36.5 C), temperature source  Oral, resp. rate 20, height 5' 9.25" (1.759 m), weight 79.379 kg (175 lb), SpO2 99 %.Body mass index is 25.66 kg/(m^2).  General Appearance: Fairly Groomed  Engineer, water::  Fair  Speech:  Clear and Coherent  Volume:  Decreased  Mood:  Anxious and Depressed  Affect:  Depressed and Tearful  Thought Process:  Coherent and Goal Directed  Orientation:  Full (Time, Place, and Person)  Thought Content:  Rumination, symptoms events worries concerns  Suicidal Thoughts:  No  Homicidal Thoughts:  No  Memory:  Immediate;   Fair Recent;   Fair Remote;   Fair  Judgement:  Fair  Insight:  Shallow  Psychomotor Activity:  Restlessness  Concentration:  Fair  Recall:  [employer] of Knowledge:Fair  Language: Fair  Akathisia:  No  Handed:  Right  AIMS (if indicated):     Assets:  Desire for Improvement  ADL's:  Intact  Cognition: WNL  Sleep:  Number of Hours: 5.5     Current Medications: Current Facility-Administered Medications  Medication Dose Route Frequency Provider Last Rate Last Dose  . acetaminophen (TYLENOL) tablet 650 mg  650 mg Oral Q6H PRN Ruben Im, PA-C      . alum & mag hydroxide-simeth (MAALOX/MYLANTA) 200-200-20 MG/5ML suspension 30 mL  30 mL Oral Q4H PRN Ruben Im, PA-C   30 mL at 07/31/14 1704  . diazepam (VALIUM) tablet 5 mg  5 mg Oral Q6H PRN Nicholaus Bloom, [name]   5 mg at 08/02/14 1705  . HYDROcodone-acetaminophen (NORCO/VICODIN) 5-325 MG per tablet 1 tablet  1 tablet Oral Q4H PRN Nicholaus Bloom, [name]   1 tablet at 08/02/14 1813  . magnesium hydroxide (MILK OF MAGNESIA) suspension 30 mL  30 mL Oral Daily PRN Ruben Im, PA-C   30 mL at 07/31/14 2122  . multivitamin with minerals tablet 1 tablet  1 tablet Oral Daily Nicholaus Bloom, [name]   1 tablet at 08/02/14 [phone]  . nicotine (NICODERM CQ - dosed in mg/24 hours) patch 21 mg  21 mg Transdermal Daily Nicholaus Bloom, [name]   21 mg at 08/02/14 0904  . ondansetron (ZOFRAN-ODT) disintegrating tablet 8 mg  8 mg Oral Q8H PRN  Laverle Hobby, PA-C      . pantoprazole (PROTONIX) EC tablet 40 mg  40 mg Oral Daily Laverle Hobby, PA-C   40 mg at 08/02/14 4496  . QUEtiapine (SEROQUEL) tablet 200 mg  200 mg Oral QHS Nicholaus Bloom, [name]   200 mg at 08/01/14 2255  . sucralfate (CARAFATE) 1 GM/10ML suspension 1 g  1 g Oral TID WC & HS Laverle Hobby, PA-C   1 g at 08/02/14 1706    Lab Results:  Results for orders placed or performed during the hospital encounter of 07/28/14 (from the past 48 hour(s))  CBC  Status: None   Collection Time: 08/01/14  6:38 PM  Result Value Ref Range   WBC 6.2 4.0 - 10.5 K/uL   RBC 4.62 4.22 - 5.81 MIL/uL   Hemoglobin 13.0 13.0 - 17.0 g/dL   HCT 39.5 39.0 - 52.0 %   MCV 85.5 78.0 - 100.0 fL   MCH 28.1 26.0 - 34.0 pg   MCHC 32.9 30.0 - 36.0 g/dL   RDW 14.5 11.5 - 15.5 %   Platelets 260 150 - 400 K/uL  Basic metabolic panel     Status: Abnormal   Collection Time: 08/01/14  6:38 PM  Result Value Ref Range   Sodium 139 135 - 145 mmol/L   Potassium 4.7 3.5 - 5.1 mmol/L   Chloride 109 101 - 111 mmol/L   CO2 25 22 - 32 mmol/L   Glucose, Bld 112 (H) 65 - 99 mg/dL   BUN 16 6 - 20 mg/dL   Creatinine, Ser 1.20 0.61 - 1.24 mg/dL   Calcium 8.4 (L) 8.9 - 10.3 mg/dL   GFR calc non Af Amer >60 >60 mL/min   GFR calc Af Amer >60 >60 mL/min    Comment: (NOTE) The eGFR has been calculated using the CKD EPI equation. This calculation has not been validated in all clinical situations. eGFR's persistently <60 mL/min signify possible Chronic Kidney Disease.    Anion gap 5 5 - 15    Physical Findings: AIMS: Facial and Oral Movements Muscles of Facial Expression: None, normal Lips and Perioral Area: None, normal Jaw: None, normal Tongue: None, normal,Extremity Movements Upper (arms, wrists, hands, fingers): None, normal Lower (legs, knees, ankles, toes): None, normal, Trunk Movements Neck, shoulders, hips: None, normal, Overall Severity Severity of abnormal movements (highest score from  questions above): None, normal Incapacitation due to abnormal movements: None, normal Patient's awareness of abnormal movements (rate only patient's report): No Awareness, Dental Status Current problems with teeth and/or dentures?: No Does patient usually wear dentures?: No  CIWA:  CIWA-Ar Total: 5 COWS:  COWS Total Score: 0  Treatment Plan Summary: Daily contact with patient to assess and evaluate symptoms and progress in treatment and Medication management Supportive approach/coping skills Mood instability/agitation; will increase the Seroquel and have some dosages during the day Work with CBT/minduflness Alcohol depedence/cocaine dependence; continue to work a relapse prevention plan Pain; continue to manage his pain Facilitate getting an appointment to the [facility] Decision Making:  Review of Psycho-Social Stressors (1), Review of Medication Regimen & Side Effects (2) and Review of New Medication or Change in Dosage (2)     Lonnie [name] A 08/02/2014, 8:53 PM

## 2014-08-02 NOTE — [facility] Suicide Risk Assessment (Signed)
[facility] INPATIENT:  Family/Significant Other Suicide Prevention Education  Suicide Prevention Education:  Education Completed: Emelda BrothersRuby Scallon at [phone] has been identified by the patient as the family member/significant other with whom the patient will be residing, and identified as the person(s) who will aid the patient in the event of a mental health crisis (suicidal ideations/suicide attempt).  With written consent from the patient, the family member/significant other has been provided the following suicide prevention education, prior to the and/or following the discharge of the patient.  The suicide prevention education provided includes the following:  Suicide risk factors  Suicide prevention and interventions  National Suicide Hotline telephone number  [facility] assessment telephone number  [facility] Emergency Assistance 911  [facility] and/or Residential Mobile Crisis Unit telephone number  Request made of family/significant other to:  Remove weapons (e.g., guns, rifles, knives), all items previously/currently identified as safety concern.    Remove drugs/medications (over-the-counter, prescriptions, illicit drugs), all items previously/currently identified as a safety concern.  The family member/significant other verbalizes understanding of the suicide prevention education information provided.  The family member/significant other agrees to remove the items of safety concern listed above.  Clide DalesHarrill, Lynzi Meulemans Campbell 08/02/2014, 4:21 PM

## 2014-08-02 NOTE — Progress Notes (Signed)
Pt has not been up much today because of the pain he is having from his hernia.  He is receiving his pain medication regularly as ordered.  He denies SI/HI/AVH with Clinical research associatewriter.  During our conversation, pt had tears in his eyes and when asked what his sadness stems from, he said he was in so much pain and he was tired of hurting.  There is a not in his chart documenting that an appt has been made for him to go [facility] center to get a referral for surgery.  Pt feels that if he could take care of the surgery and pain, then he would stop drinking and using drugs because that is the trigger for his use right now.  Pt is pleasant and cooperative with staff.  He makes is needs known to staff.  Support and encouragement offered.  Safety maintained with q15 minute checks.

## 2014-08-03 ENCOUNTER — Encounter (HOSPITAL_COMMUNITY): Payer: Self-pay | Admitting: Registered Nurse

## 2014-08-03 DIAGNOSIS — F333 Major depressive disorder, recurrent, severe with psychotic symptoms: Principal | ICD-10-CM

## 2014-08-03 MED ORDER — POLYETHYLENE GLYCOL 3350 17 G PO PACK: 17.0000 g | PACK | Freq: Every day | ORAL | Status: DC | PRN

## 2014-08-03 MED ORDER — QUETIAPINE FUMARATE 200 MG PO TABS: 200.0000 mg | ORAL_TABLET | Freq: Every day | ORAL | Status: DC

## 2014-08-03 MED ORDER — ADULT MULTIVITAMIN W/MINERALS CH: 1.0000 | ORAL_TABLET | Freq: Every day | ORAL | Status: DC

## 2014-08-03 NOTE — [facility] Group Notes (Signed)
[facility] Group Notes:  (Nursing/MHT/Case Management/Adjunct)  Date:  08/03/2014  Time:  9:49 AM  Type of Therapy:  Nurse Education  Participation Level:  Minimal  Participation Quality:  Drowsy  Affect:  Flat  Cognitive:  Lacking  Insight:  Lacking  Engagement in Group:  Pt left half way through the group  Modes of Intervention:    Summary of Progress/Problems:  Rich BraveDuke, Woodie Trusty Lynn 08/03/2014, 9:49 AM

## 2014-08-03 NOTE — [facility] Suicide Risk Assessment (Signed)
[facility] Discharge Suicide Risk Assessment   Demographic Factors:  Male  Total Time spent with patient: 30 minutes  Musculoskeletal: Strength & Muscle Tone: within normal limits Gait & Station: normal Patient leans: no lean  Psychiatric Specialty Exam: Physical Exam  Constitutional: He is oriented to person, place, and time. He appears well-developed and well-nourished.  Neurological: He is alert and oriented to person, place, and time.    Review of Systems  Constitutional: Negative for fever.  Skin: Negative for rash.  Psychiatric/Behavioral: Negative for depression.    Blood pressure 142/100, pulse 84, temperature 97.7 F (36.5 C), temperature source Oral, resp. rate 16, height 5' 9.25" (1.759 m), weight 79.379 kg (175 lb), SpO2 99 %.Body mass index is 25.66 kg/(m^2).  General Appearance: Casual  Eye Contact::  Fair  Speech:  Normal Rate409  Volume:  Normal  Mood:  Euthymic  Affect:  Congruent  Thought Process:  Coherent  Orientation:  Full (Time, Place, and Person)  Thought Content:  No psychosis  Suicidal Thoughts:  No  Homicidal Thoughts:  No  Memory:  Immediate;   Fair Recent;   Fair  Judgement:  Poor  Insight:  Shallow  Psychomotor Activity:  Normal  Concentration:  Fair  Recall:  [employer] of Knowledge:Fair  Language: Fair  Akathisia:  Negative  Handed:  Right  AIMS (if indicated):     Assets:  Leisure Time  Sleep:  Number of Hours: 5.5  Cognition: WNL  ADL's:  Intact   Have you used any form of tobacco in the last 30 days? (Cigarettes, Smokeless Tobacco, Cigars, and/or Pipes): Yes  Has this patient used any form of tobacco in the last 30 days? (Cigarettes, Smokeless Tobacco, Cigars, and/or Pipes) Yes, A prescription for an FDA-approved tobacco cessation medication was offered at discharge and the patient refused  Mental Status Per Nursing Assessment::   On Admission:  Suicide plan  Current Mental Status by Physician: no suicidal toughts  Loss  Factors: Financial problems/change in socioeconomic status  Historical Factors: Impulsivity  Risk Reduction Factors:   Positive social support  Continued Clinical Symptoms:  Poor insight. As per staff report has used pain meds to exchange for other object from other patient in room.  This will be an administrative discharge AMA. We will still give 15 day supply of his non opiate meds and follow up referrals.  Cognitive Features That Contribute To Risk:  Closed-mindedness    Suicide Risk:  Minimal: No identifiable suicidal ideation.  Patients presenting with no risk factors but with morbid ruminations; may be classified as minimal risk based on the severity of the depressive symptoms  Principal Problem: MDD (major depressive disorder), recurrent, severe, with psychosis Discharge Diagnoses:  Patient Active Problem List   Diagnosis Date Noted  . MDD (major depressive disorder), recurrent, severe, with psychosis [F33.3] 07/28/2014  . Slurred speech [R47.81] 08/09/2013  . TIA (transient ischemic attack) [G45.9] 08/09/2013  . Polyuria [R35.8] 08/09/2013  . Hematemesis [K92.0] 07/30/2013  . GI bleed [K92.2] 07/30/2013  . Schizoaffective disorder [F25.9] 07/29/2013  . Major depression, recurrent [F33.9] 07/25/2013  . Cocaine dependence [F14.20] 07/25/2013  . Alcohol dependence with alcohol-induced mood disorder [F10.24] 07/24/2013  . Depression, major, recurrent [F33.9] 05/17/2013  . Bilateral foot pain [id number], M79.672] 11/25/2011  . Chronic low back pain [M54.5, G89.29] 10/15/2011    Follow-up Information    Follow up with [facility]    . Schedule an appointment as soon as possible for a visit  on 08/06/2014.   Why:  Present at 5212 Noon on 7/12 at clinic for hospital discharge follow up.     Contact information:   [address] [place] [id number] [phone]      [facility].   Specialty:  Behavioral  Health   Why:  [facility] for hospital discharge follow up   Contact information:   [address] [place] [address] [phone]       Plan Of Care/Follow-up recommendations:  Activity:  as tolerated Diet:  regular Administrative discharge. Also patient not suicidal. He did not follow rules and was documented to sell or exchange his pain medications.  Is patient on multiple antipsychotic therapies at discharge:  No   Has Patient had three or more failed trials of antipsychotic monotherapy by history:  No  Recommended Plan for Multiple Antipsychotic Therapies: NA    Almir Botts 08/03/2014, 1:16 PM

## 2014-08-03 NOTE — Progress Notes (Signed)
Patient ID: Lonnie [name], male   DOB: May 27, 1965, 49 y.o.   MRN: [id number] Pt. Was observed by this MHT, in a verbal altercation over a pair of tennis shoes. Pt. Was agitated that a peer had entered his room and retrieved a pair tennis shoes. Writer overheard the Pt. In another Pt's room arguing that the shoes were "his" and that his peer "should not have come in his room to steal the shoes". As Probation officer approached the door, Probation officer overheard Pt's peer state that, "Five pills were the agreement, and then you could have the shoes if you met the agreement." Pt. Became agitated toward peer, and began yelling at peer down the hallway. At this time, the peer continued walking down the hallway back turned toward Pt. Arguing about the tennis shoes. Peer went into the dayroom and was followed by Pt. To continue the argument. Probation officer and another staff member entered the dayroom and de-escalated the situation. Write then informed Pt's nurse of what had been witnessed and nurse informed MD of Pt's action and situation.
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
## 2014-08-03 NOTE — [facility] Group Notes (Signed)
[facility] Group Notes:  (Clinical Social Work)  08/03/2014     10-11AM  Summary of Progress/Problems:   The main focus of today's process group was to learn how to use a decisional balance exercise to move forward in the Stages of Change, which were described and discussed.  Motivational Interviewing and a white board were utilized to help patients explore in depth the perceived benefits and costs of unhealthy coping techniques, as well as the  benefits and costs of replacing that with a healthy coping skills.   The patient expressed that their unhealthy coping involves drugs and alcohol.  He listened and was engaged in the discussion, but became drowsy and left, did not return.  Type of Therapy:  Group Therapy - Process   Participation Level:  Minimal  Participation Quality:  Attentive and Drowsy  Affect:  Blunted and Depressed  Cognitive:  Oriented  Insight:  Improving  Engagement in Therapy:  Improving  Modes of Intervention:  Education, Motivational Interviewing  Lonnie MantleMareida Grossman-Orr, LCSW 08/03/2014, 12:11 PM

## 2014-08-03 NOTE — Progress Notes (Signed)
Pt requests and was given vicodin ( for "10 out of 10" pain in his hernia area. He says " Lavenia Atlasve had it a long time...but its just gotten bad recently." Pt states he went to ED earlier this week " for diagnostic work/up" and now has an appt to f/u with wellness center next week ( for surgical opinion).  [facility] told writer this patient ( and another patient  ) were overheard talking about giving pills for money. Apparently this pt had agreed to give another pt ( (SP) " pills for my shoes", according to pt (SP). This nurse explained to MD  And np's and MD spoke with pt and then DC'd pt after  SRA completed. This nurse reviewed pt's AVS with him, pt contracts for safety and denies suicidal ideation and / or feeling to hurt self and pt given 14 days' worth seroquel as well as cc of AVS. Pt instu[facility] to [facility] and pt states understanding. Pt escorted to bldg entrance and dc'd.

## 2014-08-04 NOTE — Progress Notes (Signed)
  [facility] :  Will you be returning to the same living situation after discharge:  Yes,  with grandparents At discharge, do you have transportation home?: Yes,  no issues Do you have the ability to pay for your medications: Yes,  said no issues at admission  Release of information consent forms completed and in the chart;  Patient's signature needed at discharge.  Patient to Follow up at: Follow-up Information    Follow up with [facility]    . Schedule an appointment as soon as possible for a visit on 08/06/2014.   Why:  Present at 7612 Noon on 7/12 at clinic for hospital discharge follow up.     Contact information:   [address] [place] [id number] [phone]      [facility].   Specialty:  Behavioral Health   Why:  [facility] for hospital discharge follow up   Contact information:   [address] [place] [address] [phone]       Patient denies SI/HI: Yes,  see dr. d/c risk assessment note    Safety Planning and Suicide Prevention discussed: Yes,  with pt consent  Have you used any form of tobacco in the last 30 days? (Cigarettes, Smokeless Tobacco, Cigars, and/or Pipes): Yes  Has patient been referred to the Quitline?: Patient refused referral  Sarina SerGrossman-Orr, Canna Nickelson Jo 08/04/2014, 8:32 AM

## 2014-08-06 ENCOUNTER — Encounter: Payer: Self-pay | Admitting: Physician Assistant

## 2014-08-06 ENCOUNTER — Ambulatory Visit: Payer: Self-pay | Attending: Physician Assistant | Admitting: Physician Assistant

## 2014-08-06 VITALS — BP 133/93 | HR 99 | Temp 97.8°F | Resp 18 | Ht 71.0 in | Wt 196.4 lb

## 2014-08-06 DIAGNOSIS — K4030 Unilateral inguinal hernia, with obstruction, without gangrene, not specified as recurrent: Secondary | ICD-10-CM

## 2014-08-06 NOTE — Progress Notes (Addendum)
Lonnie [name] Krack  ZOX:[id number]  [id number]  DOB - 04/03/1965  Chief Complaint  Patient presents with  . Inguinal Hernia      Subjective:   Lonnie [name] Bouchie is a 49 y.o. male here today for establishment of care. He presented to the emergency department on May 27th with painful burning sensation in his right groin area. He's had an inguinal hernia on the right side for 2-3 years. Usually he can reduce it on his own and continue about his day. Lately, he's had a lot of burning and pain. It comes out every time he ambulates. If he reduces it, it comes right back out. It is growing in size. It's becoming more and more painful. He was sent here to receive referral for general surgery.  He had a repeat ED visit 7/3 for requesting detox from alcohol and cocaine. He was admitted by psych after exhibiting some SI. He went home with recs to [facility] and [facility] refill on Seroquel.   ROS: GEN: denies fever or chills, denies change in weight Skin: denies lesions or rashes ABD: denies abd pain, N or V EXT: denies muscle spasms or swelling; + pain in lower ext, no weakness    ALLERGIES: Allergies  Allergen Reactions  . Ibuprofen Itching  . Morphine And Related Nausea And Vomiting    PAST MEDICAL HISTORY: Past Medical History  Diagnosis Date  . GERD (gastroesophageal reflux disease)   . Depression   . Lumbago   . ETOH abuse   . Cocaine abuse   . Xanax use disorder, mild, abuse   . Marijuana abuse   . Schizophrenia   . Hypertension     pt reports that he has hx    PAST SURGICAL HISTORY: Past Surgical History  Procedure Laterality Date  . Back surgery    . Neck surgery    . Esophagogastroduodenoscopy (egd) with propofol N/A 07/30/2013    Procedure: ESOPHAGOGASTRODUODENOSCOPY (EGD) WITH PROPOFOL;  Surgeon: Barrie FolkJohn C Hayes, MD;  Location: [facility];  Service: Endoscopy;  Laterality: N/A;    MEDICATIONS AT HOME: Prior to Admission medications   Medication Sig Start Date End Date  Taking? Authorizing Provider  QUEtiapine (SEROQUEL) 200 MG tablet Take 1 tablet (200 mg total) by mouth at bedtime. 08/03/14  Yes Shuvon B Rankin, NP  risperiDONE (RISPERDAL) 2 MG tablet Take 2 mg by mouth at bedtime.   Yes Historical Provider, MD  Multiple Vitamin (MULTIVITAMIN WITH MINERALS) TABS tablet Take 1 tablet by mouth daily. Patient not taking: Reported on 08/06/2014 08/03/14   Shuvon B Rankin, NP  polyethylene glycol (MIRALAX / GLYCOLAX) packet Take 17 g by mouth daily as needed for mild constipation. Patient not taking: Reported on 08/06/2014 08/03/14   Shuvon B Rankin, NP     Objective:   Filed Vitals:   08/06/14 1215 08/06/14 1217  BP:  133/93  Pulse:  99  Temp:  97.8 F (36.6 C)  TempSrc:  Oral  Resp:  18  Height: 5\' 11"  (1.803 m)   Weight: 196 lb 6.4 oz (89.086 kg)   SpO2:  98%    Exam General appearance : Awake, alert, not in any distress. Speech Clear. Not toxic looking HEENT: Atraumatic and Normocephalic, pupils equally reactive to light and accomodation Abdomen: Bowel sounds present, Non tender and not distended with no gaurding, rigidity or rebound. Extremities:   Data Review Lab Results  Component Value Date   HGBA1C 5.9* 08/10/2013   HGBA1C 5.5 08/09/2013   HGBA1C 5.6  07/27/2013     Assessment & Plan  1. Right inguinal hernia  -General surgery referral  -Continue anti-inflammatories and narcotics as previously prescribed 2. Polysubstance abuse  -Cont counseling/meds through [employer]  3. Major depressive disorder  -Cont counseling/meds through [employer] 4. Hypertension  -re check with routine labs in 1 month  APPT with financial counselor ASAP   The patient was given clear instructions to go to ER or return to medical center if symptoms don't improve, worsen or new problems develop. The patient verbalized understanding. The patient was told to call to get lab results if they haven't heard anything in the next week.   This note has been created with  Education officer, environmental. Any transcriptional errors are unintentional.    Scot Jun, PA-C [facility] and [facility] [place], [place] [phone]   08/06/2014, 12:40 PM

## 2014-08-06 NOTE — Progress Notes (Signed)
Patient has an inguinal hernia and states he needs to see a doctor to direct him to getting surgery for it.   Paint today rated at an 8, located in the inguinal area.Pain is constant. Pain has been going on for about 3 years now.   Patient reports he takes 3 additional medications not listed on his med list, but not sure of the name.

## 2014-08-23 HISTORY — PX: INGUINAL HERNIA REPAIR: SUR1180

## 2014-08-25 ENCOUNTER — Emergency Department (HOSPITAL_BASED_OUTPATIENT_CLINIC_OR_DEPARTMENT_OTHER): Payer: Self-pay

## 2014-08-25 ENCOUNTER — Emergency Department (HOSPITAL_BASED_OUTPATIENT_CLINIC_OR_DEPARTMENT_OTHER)
Admission: EM | Admit: 2014-08-25 | Discharge: 2014-08-25 | Disposition: A | Discharge status: Home / Self Care | Payer: Self-pay | Attending: Emergency Medicine | Admitting: Emergency Medicine

## 2014-08-25 ENCOUNTER — Encounter (HOSPITAL_BASED_OUTPATIENT_CLINIC_OR_DEPARTMENT_OTHER): Payer: Self-pay | Admitting: *Deleted

## 2014-08-25 DIAGNOSIS — K5900 Constipation, unspecified: Secondary | ICD-10-CM

## 2014-08-25 DIAGNOSIS — Z720 Tobacco use: Secondary | ICD-10-CM | POA: Insufficient documentation

## 2014-08-25 DIAGNOSIS — Z9889 Other specified postprocedural states: Secondary | ICD-10-CM | POA: Insufficient documentation

## 2014-08-25 DIAGNOSIS — F209 Schizophrenia, unspecified: Secondary | ICD-10-CM | POA: Insufficient documentation

## 2014-08-25 DIAGNOSIS — R112 Nausea with vomiting, unspecified: Secondary | ICD-10-CM | POA: Insufficient documentation

## 2014-08-25 DIAGNOSIS — I10 Essential (primary) hypertension: Secondary | ICD-10-CM | POA: Insufficient documentation

## 2014-08-25 DIAGNOSIS — F329 Major depressive disorder, single episode, unspecified: Secondary | ICD-10-CM | POA: Insufficient documentation

## 2014-08-25 DIAGNOSIS — G8918 Other acute postprocedural pain: Secondary | ICD-10-CM

## 2014-08-25 DIAGNOSIS — Z79899 Other long term (current) drug therapy: Secondary | ICD-10-CM | POA: Insufficient documentation

## 2014-08-25 LAB — CBC WITH DIFFERENTIAL/PLATELET
BASOS PCT: 0 % (ref 0–1)
Basophils Absolute: 0 10*3/uL (ref 0.0–0.1)
EOS ABS: 0.1 10*3/uL (ref 0.0–0.7)
Eosinophils Relative: 1 % (ref 0–5)
HCT: 42.6 % (ref 39.0–52.0)
Hemoglobin: 14.6 g/dL (ref 13.0–17.0)
Lymphocytes Relative: 27 % (ref 12–46)
Lymphs Abs: 3.2 10*3/uL (ref 0.7–4.0)
MCH: 28.3 pg (ref 26.0–34.0)
MCHC: 34.3 g/dL (ref 30.0–36.0)
MCV: 82.7 fL (ref 78.0–100.0)
Monocytes Absolute: 1.1 10*3/uL — ABNORMAL HIGH (ref 0.1–1.0)
Monocytes Relative: 10 % (ref 3–12)
NEUTROS PCT: 62 % (ref 43–77)
Neutro Abs: 7.3 10*3/uL (ref 1.7–7.7)
PLATELETS: 287 10*3/uL (ref 150–400)
RBC: 5.15 MIL/uL (ref 4.22–5.81)
RDW: 14.7 % (ref 11.5–15.5)
WBC: 11.7 10*3/uL — ABNORMAL HIGH (ref 4.0–10.5)

## 2014-08-25 LAB — COMPREHENSIVE METABOLIC PANEL
ALBUMIN: 3.8 g/dL (ref 3.5–5.0)
ALK PHOS: 46 U/L (ref 38–126)
ALT: 29 U/L (ref 17–63)
AST: 42 U/L — AB (ref 15–41)
Anion gap: 10 (ref 5–15)
BUN: 12 mg/dL (ref 6–20)
CO2: 26 mmol/L (ref 22–32)
Calcium: 9.1 mg/dL (ref 8.9–10.3)
Chloride: 100 mmol/L — ABNORMAL LOW (ref 101–111)
Creatinine, Ser: 1.34 mg/dL — ABNORMAL HIGH (ref 0.61–1.24)
GFR calc Af Amer: >60 mL/min (ref >60)
GFR calc non Af Amer: >60 mL/min (ref >60)
Glucose, Bld: 91 mg/dL (ref 65–99)
POTASSIUM: 4.2 mmol/L (ref 3.5–5.1)
Sodium: 136 mmol/L (ref 135–145)
Total Bilirubin: 1.7 mg/dL — ABNORMAL HIGH (ref 0.3–1.2)
Total Protein: 7.5 g/dL (ref 6.5–8.1)

## 2014-08-25 LAB — URINALYSIS, ROUTINE W REFLEX MICROSCOPIC
Bilirubin Urine: NEGATIVE
Glucose, UA: NEGATIVE mg/dL
Hgb urine dipstick: NEGATIVE
KETONES UR: NEGATIVE mg/dL
Leukocytes, UA: NEGATIVE
NITRITE: NEGATIVE
Protein, ur: NEGATIVE mg/dL
Specific Gravity, Urine: 1.009 (ref 1.005–1.030)
Urobilinogen, UA: 1 mg/dL (ref 0.0–1.0)
pH: 6 (ref 5.0–8.0)

## 2014-08-25 MED ORDER — POLYETHYLENE GLYCOL 3350 17 G PO PACK: 17.0000 g | PACK | Freq: Every day | ORAL | Status: DC

## 2014-08-25 MED ORDER — SODIUM CHLORIDE 0.9 % IV BOLUS (SEPSIS)
1000.0000 mL | Freq: Once | INTRAVENOUS | Status: AC
  Administered 2014-08-25: 1000 mL via INTRAVENOUS

## 2014-08-25 MED ORDER — ONDANSETRON HCL 4 MG/2ML IJ SOLN
4.0000 mg | Freq: Once | INTRAMUSCULAR | Status: AC
  Administered 2014-08-25: 4 mg via INTRAVENOUS
  Filled 2014-08-25: qty 2

## 2014-08-25 MED ORDER — MORPHINE SULFATE 4 MG/ML IJ SOLN
4.0000 mg | Freq: Once | INTRAMUSCULAR | Status: AC
  Administered 2014-08-25: 4 mg via INTRAVENOUS
  Filled 2014-08-25: qty 1

## 2014-08-25 MED ORDER — HYDROCODONE-ACETAMINOPHEN 5-325 MG PO TABS: 1.0000 | ORAL_TABLET | Freq: Four times a day (QID) | ORAL | Status: DC | PRN

## 2014-08-25 NOTE — ED Notes (Signed)
Dr. Horton into see pt.  

## 2014-08-25 NOTE — Discharge Instructions (Signed)
You were seen today for postop pain. You need to have your Vicodin prescription filled. At this time there does not appear to be any infection and your lab work is reassuring. You had not had a bowel movement since surgery. You need to take MiraLAX 2 times daily with narcotic pain medication. You should follow-up with your surgeon as soon as possible for repeat exam. If you develop fever, redness to the surgery site, increasing pain, you should be reevaluated.    Constipation Constipation is when a person has fewer than three bowel movements a week, has difficulty having a bowel movement, or has stools that are dry, hard, or larger than normal. As people grow older, constipation is more common. If you try to fix constipation with medicines that make you have a bowel movement (laxatives), the problem may get worse. Long-term laxative use may cause the muscles of the colon to become weak. A low-fiber diet, not taking in enough fluids, and taking certain medicines may make constipation worse.  CAUSES   Certain medicines, such as antidepressants, pain medicine, iron supplements, antacids, and water pills.   Certain diseases, such as diabetes, irritable bowel syndrome (IBS), thyroid disease, or depression.   Not drinking enough water.   Not eating enough fiber-rich foods.   Stress or travel.   Lack of physical activity or exercise.   Ignoring the urge to have a bowel movement.   Using laxatives too much.  SIGNS AND SYMPTOMS   Having fewer than three bowel movements a week.   Straining to have a bowel movement.   Having stools that are hard, dry, or larger than normal.   Feeling full or bloated.   Pain in the lower abdomen.   Not feeling relief after having a bowel movement.  DIAGNOSIS  Your health care provider will take a medical history and perform a physical exam. Further testing may be done for severe constipation. Some tests may include:  A barium enema X-ray to  examine your rectum, colon, and, sometimes, your small intestine.   A sigmoidoscopy to examine your lower colon.   A colonoscopy to examine your entire colon. TREATMENT  Treatment will depend on the severity of your constipation and what is causing it. Some dietary treatments include drinking more fluids and eating more fiber-rich foods. Lifestyle treatments may include regular exercise. If these diet and lifestyle recommendations do not help, your health care provider may recommend taking over-the-counter laxative medicines to help you have bowel movements. Prescription medicines may be prescribed if over-the-counter medicines do not work.  HOME CARE INSTRUCTIONS   Eat foods that have a lot of fiber, such as fruits, vegetables, whole grains, and beans.  Limit foods high in fat and processed sugars, such as french fries, hamburgers, cookies, candies, and soda.   A fiber supplement may be added to your diet if you cannot get enough fiber from foods.   Drink enough fluids to keep your urine clear or pale yellow.   Exercise regularly or as directed by your health care provider.   Go to the restroom when you have the urge to go. Do not hold it.   Only take over-the-counter or prescription medicines as directed by your health care provider. Do not take other medicines for constipation without talking to your health care provider first.  SEEK IMMEDIATE MEDICAL CARE IF:   You have bright red blood in your stool.   Your constipation lasts for more than 4 days or gets worse.   You  have abdominal or rectal pain.   You have thin, pencil-like stools.   You have unexplained weight loss. MAKE SURE YOU:   Understand these instructions.  Will watch your condition.  Will get help right away if you are not doing well or get worse. Document Released: 10/10/2003 Document Revised: 01/16/2013 Document Reviewed: 10/23/2012 [facility] Patient Information 2015 [place], [place]. This  information is not intended to replace advice given to you by your health care provider. Make sure you discuss any questions you have with your health care provider.

## 2014-08-25 NOTE — ED Notes (Signed)
Back to room in w/c, alert, NAD, calm, interactive.

## 2014-08-25 NOTE — ED Provider Notes (Addendum)
CSN: [id number]     Arrival date & time 08/25/14  0125 History   First MD Initiated Contact with Patient 08/25/14 0155     Chief Complaint  Patient presents with  . pain from hernia surgery      (Consider location/radiation/quality/duration/timing/severity/associated sxs/prior Treatment) HPI  This a 49 year old male who presents with postop pain. Patient had right-sided inguinal hernia repair on Friday at [facility]. This was an outpatient procedure. Patient states that he has been taking Tylenol at home. He has had increasing pain over the right flank and incision site.  Patient states that he did not get his Vicodin prescription filled as he has a history of polysubstance abuse and did not feel that this was good for him. Patient reports one episode of emesis yesterday morning at 6 AM. He reports that he has not had a bowel movement since surgery. No fevers.  Past Medical History  Diagnosis Date  . GERD (gastroesophageal reflux disease)   . Depression   . Lumbago   . ETOH abuse   . Cocaine abuse   . Xanax use disorder, mild, abuse   . Marijuana abuse   . Schizophrenia   . Hypertension     pt reports that he has hx   Past Surgical History  Procedure Laterality Date  . Back surgery    . Neck surgery    . Esophagogastroduodenoscopy (egd) with propofol N/A 07/30/2013    Procedure: ESOPHAGOGASTRODUODENOSCOPY (EGD) WITH PROPOFOL;  Surgeon: Barrie Folk, MD;  Location: [facility];  Service: Endoscopy;  Laterality: N/A;  . Hernia repair     Family History  Problem Relation Age of Onset  . Diabetes Mother   . Hypertension Mother   . Hyperlipidemia Father   . Heart attack Neg Hx   . Sudden death Neg Hx    History  Substance Use Topics  . Smoking status: Current Some Day Smoker -- 0.00 packs/day for 0 years    Types: Cigarettes    Last Attempt to Quit: 08/08/2012  . Smokeless tobacco: Never Used  . Alcohol Use: No    Review of Systems  Constitutional: Negative.   Negative for fever.  Respiratory: Negative.  Negative for chest tightness and shortness of breath.   Cardiovascular: Negative.  Negative for chest pain.  Gastrointestinal: Positive for nausea, vomiting, abdominal pain and constipation. Negative for diarrhea.  Genitourinary: Negative.  Negative for dysuria.  Musculoskeletal: Negative for back pain.  Neurological: Negative for headaches.  All other systems reviewed and are negative.     Allergies  Ibuprofen and Morphine and related  Home Medications   Prior to Admission medications   Medication Sig Start Date End Date Taking? Authorizing Provider  HYDROcodone-acetaminophen (NORCO/VICODIN) 5-325 MG per tablet Take 1-2 tablets by mouth every 6 (six) hours as needed. 08/25/14   Shon Baton, MD  Multiple Vitamin (MULTIVITAMIN WITH MINERALS) TABS tablet Take 1 tablet by mouth daily. Patient not taking: Reported on 08/06/2014 08/03/14   Shuvon B Rankin, NP  polyethylene glycol (MIRALAX) packet Take 17 g by mouth daily. 08/25/14   Shon Baton, MD  QUEtiapine (SEROQUEL) 200 MG tablet Take 1 tablet (200 mg total) by mouth at bedtime. 08/03/14   Shuvon B Rankin, NP  risperiDONE (RISPERDAL) 2 MG tablet Take 2 mg by mouth at bedtime.    Historical Provider, MD   BP 115/73 mmHg  Pulse 66  Temp(Src) 98.9 F (37.2 C) (Oral)  Resp 20  Ht 5\' 9"  (1.753 m)  Wt 200 lb (90.719 kg)  BMI 29.52 kg/m2  SpO2 98% Physical Exam  Constitutional: He is oriented to person, place, and time. He appears well-developed and well-nourished. No distress.  HENT:  Head: Normocephalic and atraumatic.  Cardiovascular: Normal rate, regular rhythm and normal heart sounds.   No murmur heard. Pulmonary/Chest: Effort normal and breath sounds normal. No respiratory distress. He has no wheezes.  Abdominal: Soft. Bowel sounds are normal. He exhibits no distension. There is tenderness. There is no rebound and no guarding.  Tenderness to palpation with mild swelling noted  over the right inguinal region, incision approximately 6 cm is clean dry and intact, no crepitus, no significant erythema  Genitourinary:  Uncircumcised, no testicular tenderness and no crepitus  Musculoskeletal: He exhibits no edema.  Neurological: He is alert and oriented to person, place, and time.  Skin: Skin is warm and dry.  Psychiatric: He has a normal mood and affect.  Nursing note and vitals reviewed.   ED Course  Procedures (including critical care time) Labs Review Labs Reviewed  CBC WITH DIFFERENTIAL/PLATELET - Abnormal; Notable for the following:    WBC 11.7 (*)    Monocytes Absolute 1.1 (*)    All other components within normal limits  COMPREHENSIVE METABOLIC PANEL - Abnormal; Notable for the following:    Chloride 100 (*)    Creatinine, Ser 1.34 (*)    AST 42 (*)    Total Bilirubin 1.7 (*)    All other components within normal limits  URINALYSIS, ROUTINE W REFLEX MICROSCOPIC (NOT AT [facility])  I-STAT CG4 LACTIC ACID, ED  I-STAT CG4 LACTIC ACID, ED    Imaging Review Dg Abd 1 View  08/25/2014   CLINICAL DATA:  Abdominal pain.  Inguinal hernia surgery on Friday.  EXAM: ABDOMEN - 1 VIEW  COMPARISON:  None.  FINDINGS: The bowel gas pattern is normal. No radio-opaque calculi or other significant radiographic abnormality are seen.  IMPRESSION: Negative.   Electronically Signed   By: Ellery Plunk M.D.   On: 08/25/2014 02:30     EKG Interpretation None      MDM   Final diagnoses:  Post-op pain  Constipation, unspecified constipation type    Patient presents with postoperative pain. Nontoxic on exam. Vital signs are reassuring. He is tender over incision site. There is mild swelling noted about the incision site extending into the mons pubis. No evidence of infection. Patient is otherwise nontoxic. He does report one episode of emesis. Pain could be related to normal postoperative pain versus SBO versus infection.  Lab work obtained enlarging reassuring. KUB of the  abdomen is nonobstructive. Patient given pain and nausea medicine and reports relief of his symptoms. At this time there does not appear to be an acute emergency. Discuss with patient pain control at home. He needs to get his Vicodin filled as his pain is not controlled with Tylenol alone. He was advised if he has any new or worsening symptoms he should follow-up with his surgeon or come back to the ER. I encouraged him to go to [facility] if he has no worsening symptoms given that his surgery was done there.  After history, exam, and medical workup I feel the patient has been appropriately medically screened and is safe for discharge home. Pertinent diagnoses were discussed with the patient. Patient was given return precautions.     Shon Baton, MD 08/25/14 [phone]  At discharge, patient stated to nursing staff that he previously refused the Vicodin Rx  provided by his general surgeon and does not have a prescription. He will be provided with a 1 day supply of pain medication. He needs to follow-up with the surgeon for further pain management.  Shon Baton, MD 08/25/14 [phone]

## 2014-08-25 NOTE — ED Notes (Addendum)
Pt states he had hernia surgery on Friday. States he has been taking tylenol without relief. Last dose at 5pm.  States he did not fill the Rx for Vicodin because he has a hx of Cocaine in the past and was concerned about taking the medication. Pt here due to pain control

## 2014-08-25 NOTE — ED Notes (Signed)
Dr. Wilkie Aye into room.  Pt seen by EDP prior to RN assessment, see MD notes, pending orders.

## 2014-08-25 NOTE — ED Notes (Signed)
Resting/ sleeping, NAD, calm, "feel better", encouraged to give urine sample, will attempt.

## 2014-08-25 NOTE — ED Notes (Signed)
back from xray

## 2014-08-25 NOTE — ED Notes (Addendum)
D/c'd from [facility] on Friday s/p R inguinal hernia surgery. Has only been taking ES Tylenol. Here tonight for increased and continuing and worsening pain and nv. Concern for straining and bleeding at incision site. Last ate 1700. Last BM Thursday. Vomited x1 this am at 0600. Denies fever.

## 2014-08-26 LAB — I-STAT CG4 LACTIC ACID, ED: Lactic Acid, Venous: 1 mmol/L (ref 0.5–2.0)

## 2014-09-06 ENCOUNTER — Ambulatory Visit: Payer: Self-pay | Admitting: Family Medicine

## 2014-09-06 ENCOUNTER — Encounter: Payer: Self-pay | Admitting: Family Medicine

## 2014-10-27 ENCOUNTER — Encounter (HOSPITAL_COMMUNITY): Payer: Self-pay | Admitting: Emergency Medicine

## 2014-10-27 ENCOUNTER — Emergency Department (HOSPITAL_COMMUNITY)
Admission: EM | Admit: 2014-10-27 | Discharge: 2014-10-28 | Disposition: A | Discharge status: Other Acute Inpatient Hospital | Payer: Self-pay | Attending: Emergency Medicine | Admitting: Emergency Medicine

## 2014-10-27 DIAGNOSIS — F1410 Cocaine abuse, uncomplicated: Secondary | ICD-10-CM | POA: Insufficient documentation

## 2014-10-27 DIAGNOSIS — Z8719 Personal history of other diseases of the digestive system: Secondary | ICD-10-CM | POA: Insufficient documentation

## 2014-10-27 DIAGNOSIS — F209 Schizophrenia, unspecified: Secondary | ICD-10-CM | POA: Insufficient documentation

## 2014-10-27 DIAGNOSIS — I10 Essential (primary) hypertension: Secondary | ICD-10-CM | POA: Insufficient documentation

## 2014-10-27 DIAGNOSIS — F329 Major depressive disorder, single episode, unspecified: Secondary | ICD-10-CM | POA: Insufficient documentation

## 2014-10-27 DIAGNOSIS — F32A Depression, unspecified: Secondary | ICD-10-CM

## 2014-10-27 DIAGNOSIS — Z79899 Other long term (current) drug therapy: Secondary | ICD-10-CM | POA: Insufficient documentation

## 2014-10-27 DIAGNOSIS — Z720 Tobacco use: Secondary | ICD-10-CM | POA: Insufficient documentation

## 2014-10-27 DIAGNOSIS — H539 Unspecified visual disturbance: Secondary | ICD-10-CM | POA: Insufficient documentation

## 2014-10-27 LAB — SALICYLATE LEVEL: Salicylate Lvl: <4 mg/dL (ref 2.8–30.0)

## 2014-10-27 LAB — RAPID URINE DRUG SCREEN, HOSP PERFORMED
Amphetamines: NOT DETECTED
BARBITURATES: NOT DETECTED
BENZODIAZEPINES: NOT DETECTED
Cocaine: POSITIVE — AB
Opiates: NOT DETECTED
TETRAHYDROCANNABINOL: NOT DETECTED

## 2014-10-27 LAB — ETHANOL: Alcohol, Ethyl (B): 40 mg/dL — ABNORMAL HIGH (ref <5)

## 2014-10-27 LAB — CBC
HCT: 42.1 % (ref 39.0–52.0)
Hemoglobin: 14 g/dL (ref 13.0–17.0)
MCH: 28.1 pg (ref 26.0–34.0)
MCHC: 33.3 g/dL (ref 30.0–36.0)
MCV: 84.5 fL (ref 78.0–100.0)
Platelets: 327 10*3/uL (ref 150–400)
RBC: 4.98 MIL/uL (ref 4.22–5.81)
RDW: 13.7 % (ref 11.5–15.5)
WBC: 6.7 10*3/uL (ref 4.0–10.5)

## 2014-10-27 LAB — COMPREHENSIVE METABOLIC PANEL
ALBUMIN: 3.9 g/dL (ref 3.5–5.0)
ALT: 30 U/L (ref 17–63)
AST: 48 U/L — AB (ref 15–41)
Alkaline Phosphatase: 47 U/L (ref 38–126)
Anion gap: 9 (ref 5–15)
BILIRUBIN TOTAL: 0.8 mg/dL (ref 0.3–1.2)
BUN: 8 mg/dL (ref 6–20)
CO2: 24 mmol/L (ref 22–32)
Calcium: 8.7 mg/dL — ABNORMAL LOW (ref 8.9–10.3)
Chloride: 99 mmol/L — ABNORMAL LOW (ref 101–111)
Creatinine, Ser: 1.37 mg/dL — ABNORMAL HIGH (ref 0.61–1.24)
GFR calc Af Amer: >60 mL/min (ref >60)
GFR calc non Af Amer: 60 mL/min — ABNORMAL LOW (ref >60)
Glucose, Bld: 82 mg/dL (ref 65–99)
Potassium: 4 mmol/L (ref 3.5–5.1)
SODIUM: 132 mmol/L — AB (ref 135–145)
TOTAL PROTEIN: 6.6 g/dL (ref 6.5–8.1)

## 2014-10-27 LAB — ACETAMINOPHEN LEVEL: Acetaminophen (Tylenol), Serum: <10 ug/mL — ABNORMAL LOW (ref 10–30)

## 2014-10-27 MED ORDER — THIAMINE HCL 100 MG/ML IJ SOLN: 100.0000 mg | Freq: Every day | INTRAMUSCULAR | Status: DC

## 2014-10-27 MED ORDER — QUETIAPINE FUMARATE 200 MG PO TABS: 200.0000 mg | ORAL_TABLET | Freq: Every day | ORAL | Status: DC

## 2014-10-27 MED ORDER — LORAZEPAM 1 MG PO TABS: 0.0000 mg | ORAL_TABLET | Freq: Two times a day (BID) | ORAL | Status: DC

## 2014-10-27 MED ORDER — RISPERIDONE 0.5 MG PO TABS: 4.0000 mg | ORAL_TABLET | Freq: Every day | ORAL | Status: DC

## 2014-10-27 MED ORDER — ADULT MULTIVITAMIN W/MINERALS CH
1.0000 | ORAL_TABLET | Freq: Every day | ORAL | Status: DC
  Administered 2014-10-28: 1 via ORAL
  Filled 2014-10-27: qty 1

## 2014-10-27 MED ORDER — LORAZEPAM 1 MG PO TABS
0.0000 mg | ORAL_TABLET | Freq: Four times a day (QID) | ORAL | Status: DC
Start: 2014-10-27 — End: 2014-10-28
  Administered 2014-10-27 – 2014-10-28 (×2): 1 mg via ORAL
  Filled 2014-10-27 (×2): qty 1

## 2014-10-27 MED ORDER — VITAMIN B-1 100 MG PO TABS
100.0000 mg | ORAL_TABLET | Freq: Every day | ORAL | Status: DC
  Administered 2014-10-27 – 2014-10-28 (×2): 100 mg via ORAL
  Filled 2014-10-27 (×2): qty 1

## 2014-10-27 MED ORDER — TRAZODONE HCL 50 MG PO TABS: 50.0000 mg | ORAL_TABLET | Freq: Every day | ORAL | Status: DC

## 2014-10-27 NOTE — BH Assessment (Addendum)
Tele Assessment Note   Lonnie [name] is an 49 y.o.single male who walked to the [facility] to ask for help c/o suicidal ideation with a plan 2 days ago to shoot himself and a plan today to walk or jump into traffic to be hit.  Pt sts that he "gave a friend my gun to hold about 2 days ago." Pt sts that he has been having SI for about 4 weeks and has been on a drug and alcohol binge for 4 days.  Pt tested 40 on BAL and + for cocaine on his UDS tonight. Pt sts that he was told recently by his son's mother that he could no longer see his son and pt sts this event is what upset him and sent him "over the edge" emotionally. Pt sts that his son is his "reason for living." Pt sts that he has had some passing thoughts of harming his "baby mama" but never had a plan or took any action. Pt sts that he is glad he is not around her and has not hurt her because he "would probably regret it." Pt sts that he has had substance use problems over the years but sts he only binges periodically and does not use drugs or alcohol regularly.  Pt sts he has not slept or eaten in 4 days.  Pt sts that about 6 weeks ago he had surgery to repair a hernia and since that time has not been able to eat well, losing about 30 lbs in that timeframe.  Pt sts he is "hearing voices" that are telling him to "do things."  Pt sts "doing things" includes "getting things I like and having them" and "killing myself." Pt sts he also often has the sensation that someone is following him or watching him but, when he turns to look behind him, no one is there. Pt sts that he has been having these hallucinations about 3 years and he sts he has them whether or not he is using drugs and alcohol. Pt has most symptoms of depression including deep sadness, fatigue, excessive guilt, lower self esteem, tearfulness, isolating himself, lack of motivation for activities, irritability, feeling helpless and hopeless often. Pt sts he does have pending charges against him for  obtaining property by false pretense which he sts he expects will be dropped by the end of the month.  Pt sts he is not on probation and has no other legal issues currently.   Pt sts he lives with his GM currently but has not bee home in 4 days and nights. Pt sts that he is in the process of seeking disability status/income based on a bulging disc in his back and other disabling conditions.  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 Pt sts he has been going to [facility] for medication management.  Pt sts he has told them that he does not believe that his medication is working but they do nothing to help him. Pt sts he ran out of medication about 1 week ago. Pt sts he has not been to OPT at [facility] or ever.   Pt was dressed in scrubs and lying in his hospital bed during the assessment. Pt was alert, cooperative and pleasant. Pt kept fair eye contact but at times covered his eyes with his forearm.  Pt spoke with rapid, pressured speech and used many arm and hand gestures while talking.  Pt appeared restless and on edge. Pt moved in a normal manner. Pt's thoughts  were coherent and relevant but, his judgement was impaired. Pt's mood was depressed and irritable and his blunted affect was congruent. Pt was oriented x 4.   Diagnosis: 311 Unspecified Depressive Disorder; 304.20 Cocaine Use Disorder; 303.90 Alcohol Use Disorder  Past Medical History:  Past Medical History  Diagnosis Date  . GERD (gastroesophageal reflux disease)   . Depression   . Lumbago   . ETOH abuse   . Cocaine abuse   . Xanax use disorder, mild, abuse   . Marijuana abuse   . Schizophrenia (HCC)   . Hypertension     pt reports that he has hx    Past Surgical History  Procedure Laterality Date  . Back surgery    . Neck surgery    . Esophagogastroduodenoscopy (egd) with propofol N/A 07/30/2013    Procedure: ESOPHAGOGASTRODUODENOSCOPY (EGD) WITH PROPOFOL;  Surgeon: Barrie Folk, MD;  Location: [facility] ENDOSCOPY;  Service: Endoscopy;   Laterality: N/A;  . Hernia repair Right 08/23/2014     at [facility]     Family History:  Family History  Problem Relation Age of Onset  . Diabetes Mother   . Hypertension Mother   . Hyperlipidemia Father   . Heart attack Neg Hx   . Sudden death Neg Hx     Social History:  reports that he has been smoking Cigarettes.  He has been smoking about 0.00 packs per day for the past 0 years. He has never used smokeless tobacco. He reports that he drinks alcohol. He reports that he uses illicit drugs (Cocaine).  Additional Social History:  Alcohol / Drug Use Prescriptions: See PTA list History of alcohol / drug use?: Yes Longest period of sobriety (when/how long): "don't use regualry, I binge only when depressed" Substance #1 Name of Substance 1: Crack Cocaine 1 - Age of First Use: 20 1 - Amount (size/oz): "don't know" 1 - Frequency: "haven't binged in 3 months" 1 - Duration: 4 days 1 - Last Use / Amount: today 10/27/14 5 pm Substance #2 Name of Substance 2: Alcohol 2 - Age of First Use: 13 2 - Amount (size/oz): "don't know" 2 - Frequency: "haven't binged in 3 months" 2 - Duration: 4 days 2 - Last Use / Amount: today 10/27/14 pm  CIWA: CIWA-Ar BP: 118/70 mmHg Pulse Rate: 96 COWS:    PATIENT STRENGTHS: (choose at least two) Average or above average intelligence Capable of independent living Communication skills Supportive family/friends  Allergies:  Allergies  Allergen Reactions  . Morphine And Related Nausea And Vomiting    Home Medications:  (Not in a hospital admission)  OB/GYN Status:  No LMP for male patient.  General Assessment Data Location of Assessment: [facility] ED TTS Assessment: In system Is this a Tele or Face-to-Face Assessment?: Tele Assessment Is this an Initial Assessment or a Re-assessment for this encounter?: Initial Assessment Marital status: Single Maiden name: na Is patient pregnant?: No Pregnancy Status: No Living Arrangements:  Non-relatives/Friends (lives with GM) Can pt return to current living arrangement?: Yes Admission Status: Voluntary Is patient capable of signing voluntary admission?: Yes Referral Source: Self/Family/Friend Insurance type: None  Medical Screening Exam [facility] Walk-in ONLY) Medical Exam completed: Yes  Crisis Care Plan Living Arrangements: Non-relatives/Friends (lives with GM) Name of Psychiatrist: Transport planner Name of Therapist: none  Education Status Is patient currently in school?: No Current Grade: na Highest grade of school patient has completed: 12 (plus 1 yr college) Name of school: na Contact person: na  Risk to self  with the past 6 months Suicidal Ideation: Yes-Currently Present Has patient been a risk to self within the past 6 months prior to admission? : Yes Suicidal Intent: Yes-Currently Present Has patient had any suicidal intent within the past 6 months prior to admission? : Yes Is patient at risk for suicide?: Yes Suicidal Plan?: Yes-Currently Present Has patient had any suicidal plan within the past 6 months prior to admission? : Yes Specify Current Suicidal Plan: plan to shoot himself or jump into traffic to be hit Access to Means: Yes Specify Access to Suicidal Means: sts has a gun he gave to a friend to hold What has been your use of drugs/alcohol within the last 12 months?: binges only per pt Previous Attempts/Gestures: Yes How many times?: 1 Other Self Harm Risks: none Triggers for Past Attempts: Unpredictable, Family contact Intentional Self Injurious Behavior: None Family Suicide History: Unknown Recent stressful life event(s): Loss (Comment) ("baby mama" sts he cannot see his son any more) Persecutory voices/beliefs?: Yes Depression: Yes Depression Symptoms: Despondent, Insomnia, Tearfulness, Isolating, Fatigue, Guilt, Loss of interest in usual pleasures, Feeling worthless/self pity, Feeling angry/irritable Substance abuse history and/or treatment for  substance abuse?: Yes Suicide prevention information given to non-admitted patients: Not applicable  Risk to Others within the past 6 months Homicidal Ideation: No (denies) Does patient have any lifetime risk of violence toward others beyond the six months prior to admission? : No (denies) Thoughts of Harm to Others: Yes-Currently Present (thought of harm to "baby mama") Comment - Thoughts of Harm to Others: thoughts of harm to his "baby's mama" Current Homicidal Intent: No (denies) Current Homicidal Plan: No (denies) Access to Homicidal Means:  (access to gun; gave to a friend to hold per pt) History of harm to others?: No (deneis) Assessment of Violence: None Noted (denies) Violent Behavior Description: na (denies) Does patient have access to weapons?: Yes (Comment) (pt sts he gave his gun to a friend to hold) Criminal Charges Pending?: Yes Describe Pending Criminal Charges: chgs for obtaining property by false pretenses (court date end of October) Does patient have a court date: Yes Court Date:  (end of October; expects chgs to be dropped) Is patient on probation?: No (denies)  Psychosis Hallucinations: Auditory, Visual (when using and when sober per pt) Delusions: None noted  Mental Status Report Appearance/Hygiene: In scrubs, Unremarkable Eye Contact: Fair Motor Activity: Mannerisms, Restlessness Speech: Logical/coherent, Rapid, Pressured Level of Consciousness: Alert Mood: Depressed, Despair Affect: Blunted, Depressed Anxiety Level: None Thought Processes: Coherent, Relevant Judgement: Impaired Orientation: Person, Place, Time, Situation Obsessive Compulsive Thoughts/Behaviors: None  Cognitive Functioning Concentration: Fair Memory: Recent Intact, Remote Intact IQ: Average Insight: Poor Impulse Control: Poor Appetite: Fair Weight Loss: 30 (30 lbs in about 6 wks since hernia surgery) Weight Gain: 0 Sleep: Decreased Total Hours of Sleep: 3 (3 hrs at best for weeks  per pt) Vegetative Symptoms: None  ADLScreening [facility] Assessment Services) Patient's cognitive ability adequate to safely complete daily activities?: Yes Patient able to express need for assistance with ADLs?: Yes Independently performs ADLs?: Yes (appropriate for developmental age)  Prior Inpatient Therapy Prior Inpatient Therapy: Yes Prior Therapy Dates: July, 2016 Prior Therapy Facilty/Provider(s): [facility] Reason for Treatment: SI, MDD, Polysubstance abuse  Prior Outpatient Therapy Prior Outpatient Therapy: No Prior Therapy Dates: na Prior Therapy Facilty/Provider(s): na Reason for Treatment: na Does patient have an ACCT team?: No Does patient have Intensive In-House Services?  : No Does patient have [facility] services? : Yes Does patient have P4CC services?: No  ADL Screening (condition at time of admission) Patient's cognitive ability adequate to safely complete daily activities?: Yes Patient able to express need for assistance with ADLs?: Yes Independently performs ADLs?: Yes (appropriate for developmental age)       Abuse/Neglect Assessment (Assessment to be complete while patient is alone) Physical Abuse: Denies Verbal Abuse: Denies Sexual Abuse: Denies Exploitation of patient/patient's resources: Denies Self-Neglect: Denies     Merchant navy officer (For Healthcare) Does patient have an advance directive?: No Would patient like information on creating an advanced directive?: No - patient declined information    Additional Information 1:1 In Past 12 Months?: No CIRT Risk: No Elopement Risk: No Does patient have medical clearance?: Yes     Disposition:  Disposition Initial Assessment Completed for this Encounter: Yes Disposition of Patient: Other dispositions (Pending review w BHH Extender) Other disposition(s): Other (Comment)  Per Hulan Fess, NP: Meets IP criteria. Dual Diagnosis: MDD & SA Per Ronnie Doss, AC: No appropriate beds available at [facility]  (Dual Dx); TTS will seek outside placemen  Spoke with Dr. Criss Alvine at [facility]: Advised of recommendation.  He agreed. Spoke to nurse Vicente Serene at [facility]: Advised of plan.  Beryle Flock, MS, [facility], [facility] Triage Specialist [facility] T 10/27/2014 9:23 PM

## 2014-10-27 NOTE — ED Provider Notes (Signed)
CSN: [id number]     Arrival date & time 10/27/14  1840 History   First MD Initiated Contact with Patient 10/27/14 2026     Chief Complaint  Patient presents with  . Suicidal     (Consider location/radiation/quality/duration/timing/severity/associated sxs/prior Treatment) HPI  49 year old male with a history of alcohol and cocaine abuse as well as schizophrenia presents with increasing depression and suicidal thoughts over the last 1 month. States he has been hearing voices telling him to kill himself. He has occasionally thought of walking and a traffic to die. Denies homicidal thoughts. Has not been feeling any other significant changes such as fever, chest pain, or shortness of breath. He does endorse occasional blurry vision over the last 3 days, is unclear if it happens while using/abusing alcohol or cocaine. Denies any blurry vision now. No headaches.  Past Medical History  Diagnosis Date  . GERD (gastroesophageal reflux disease)   . Depression   . Lumbago   . ETOH abuse   . Cocaine abuse   . Xanax use disorder, mild, abuse   . Marijuana abuse   . Schizophrenia (HCC)   . Hypertension     pt reports that he has hx   Past Surgical History  Procedure Laterality Date  . Back surgery    . Neck surgery    . Esophagogastroduodenoscopy (egd) with propofol N/A 07/30/2013    Procedure: ESOPHAGOGASTRODUODENOSCOPY (EGD) WITH PROPOFOL;  Surgeon: Barrie Folk, MD;  Location: [facility] END[facility];  Service: Endoscopy;  Laterality: N/A;  . Hernia repair Right 08/23/2014     at [facility]    Family History  Problem Relation Age of Onset  . Diabetes Mother   . Hypertension Mother   . Hyperlipidemia Father   . Heart attack Neg Hx   . Sudden death Neg Hx    Social History  Substance Use Topics  . Smoking status: Current Some Day Smoker -- 0.00 packs/day for 0 years    Types: Cigarettes  . Smokeless tobacco: Never Used  . Alcohol Use: Yes     Comment: 3-4 40 oz per day    Review of  Systems  Eyes: Positive for visual disturbance. Negative for pain.  Respiratory: Negative for shortness of breath.   Cardiovascular: Negative for chest pain.  Gastrointestinal: Negative for vomiting.  Neurological: Negative for headaches.  Psychiatric/Behavioral: Positive for suicidal ideas, hallucinations and dysphoric mood.  All other systems reviewed and are negative.     Allergies  Morphine and related  Home Medications   Prior to Admission medications   Medication Sig Start Date End Date Taking? Authorizing Provider  acetaminophen (TYLENOL) 500 MG tablet Take 1,500 mg by mouth 4 (four) times daily as needed (pain).   Yes Historical Provider, MD  risperiDONE (RISPERDAL) 2 MG tablet Take 4 mg by mouth at bedtime.    Yes Historical Provider, MD  traZODone (DESYREL) 50 MG tablet Take 50 mg by mouth at bedtime.   Yes Historical Provider, MD  HYDROcodone-acetaminophen (NORCO/VICODIN) 5-325 MG per tablet Take 1-2 tablets by mouth every 6 (six) hours as needed. Patient not taking: Reported on 10/27/2014 08/25/14   Shon Baton, MD  Multiple Vitamin (MULTIVITAMIN WITH MINERALS) TABS tablet Take 1 tablet by mouth daily. Patient not taking: Reported on 08/06/2014 08/03/14   Shuvon B Rankin, NP  polyethylene glycol (MIRALAX) packet Take 17 g by mouth daily. Patient not taking: Reported on 10/27/2014 08/25/14   Shon Baton, MD  QUEtiapine (SEROQUEL) 200 MG tablet Take  1 tablet (200 mg total) by mouth at bedtime. Patient not taking: Reported on 10/27/2014 08/03/14   Shuvon B Rankin, NP   BP 118/70 mmHg  Pulse 96  Temp(Src) 98.5 F (36.9 C) (Oral)  Resp 16  Ht  (1.753 m)  Wt 178 lb (80.74 kg)  BMI 26.27 kg/m2  SpO2 97% Physical Exam  Constitutional: He is oriented to person, place, and time. He appears well-developed and well-nourished.  HENT:  Head: Normocephalic and atraumatic.  Right Ear: External ear normal.  Left Ear: External ear normal.  Nose: Nose normal.  Eyes:  Conjunctivae and EOM are normal. Pupils are equal, round, and reactive to light. Right eye exhibits no discharge. Left eye exhibits no discharge.  Neck: Neck supple.  Cardiovascular: Normal rate, regular rhythm, normal heart sounds and intact distal pulses.   Pulmonary/Chest: Effort normal and breath sounds normal.  Abdominal: Soft. He exhibits no distension. There is no tenderness.  Musculoskeletal: He exhibits no edema.  Neurological: He is alert and oriented to person, place, and time.  Skin: Skin is warm and dry.  Psychiatric: He expresses suicidal ideation. He expresses suicidal plans.  Nursing note and vitals reviewed.   ED Course  Procedures (including critical care time) Labs Review Labs Reviewed  COMPREHENSIVE METABOLIC PANEL - Abnormal; Notable for the following:    Sodium 132 (*)    Chloride 99 (*)    Creatinine, Ser 1.37 (*)    Calcium 8.7 (*)    AST 48 (*)    GFR calc non Af Amer 60 (*)    All other components within normal limits  ETHANOL - Abnormal; Notable for the following:    Alcohol, Ethyl (B) 40 (*)    All other components within normal limits  ACETAMINOPHEN LEVEL - Abnormal; Notable for the following:    Acetaminophen (Tylenol), Serum <10 (*)    All other components within normal limits  URINE RAPID DRUG SCREEN, HOSP PERFORMED - Abnormal; Notable for the following:    Cocaine POSITIVE (*)    All other components within normal limits  SALICYLATE LEVEL  CBC    Imaging Review No results found. I have personally reviewed and evaluated these images and lab results as part of my medical decision-making.   EKG Interpretation None      MDM   Final diagnoses:  Depression    Patient has been medically cleared. His intermittent blurry vision is of unclear etiology, likely associated with abusing drugs. Eye exam is normal now. No hypertension. Patient is calm and collected. Plan for telemetry psych and likely admission.    Pricilla Loveless, MD 10/27/14  2203

## 2014-10-27 NOTE — ED Notes (Signed)
C/o suicidal ideation over the past week with a plan to overdose on pills.  Reports last crack use and etoh approx 1 hour ago.  Ran out of psych meds 1 week ago.

## 2014-10-27 NOTE — ED Notes (Signed)
Antonio in staffing notified of [facility] sitter need. Charge RN notified of no sitter available from staffing at this time.  Security called to wand pt after he changed into paper scrubs.

## 2014-10-28 ENCOUNTER — Observation Stay (HOSPITAL_COMMUNITY)
Admission: AD | Admit: 2014-10-28 | Discharge: 2014-10-30 | Disposition: A | Discharge status: Home / Self Care | Payer: Federal, State, Local not specified - Other | Source: Intra-hospital | Attending: Psychiatry | Admitting: Psychiatry

## 2014-10-28 DIAGNOSIS — F333 Major depressive disorder, recurrent, severe with psychotic symptoms: Principal | ICD-10-CM | POA: Insufficient documentation

## 2014-10-28 DIAGNOSIS — F1490 Cocaine use, unspecified, uncomplicated: Secondary | ICD-10-CM | POA: Insufficient documentation

## 2014-10-28 DIAGNOSIS — F329 Major depressive disorder, single episode, unspecified: Secondary | ICD-10-CM | POA: Diagnosis present

## 2014-10-28 DIAGNOSIS — F17210 Nicotine dependence, cigarettes, uncomplicated: Secondary | ICD-10-CM | POA: Insufficient documentation

## 2014-10-28 DIAGNOSIS — F32A Depression, unspecified: Secondary | ICD-10-CM | POA: Diagnosis present

## 2014-10-28 DIAGNOSIS — Z8673 Personal history of transient ischemic attack (TIA), and cerebral infarction without residual deficits: Secondary | ICD-10-CM | POA: Insufficient documentation

## 2014-10-28 DIAGNOSIS — K219 Gastro-esophageal reflux disease without esophagitis: Secondary | ICD-10-CM | POA: Insufficient documentation

## 2014-10-28 MED ORDER — ALUM & MAG HYDROXIDE-SIMETH 200-200-20 MG/5ML PO SUSP: 30.0000 mL | ORAL | Status: DC | PRN

## 2014-10-28 MED ORDER — TRAZODONE HCL 50 MG PO TABS
50.0000 mg | ORAL_TABLET | Freq: Every evening | ORAL | Status: DC | PRN
  Administered 2014-10-28 – 2014-10-29 (×2): 50 mg via ORAL
  Filled 2014-10-28 (×2): qty 1

## 2014-10-28 MED ORDER — CITALOPRAM HYDROBROMIDE 10 MG PO TABS
10.0000 mg | ORAL_TABLET | Freq: Every day | ORAL | Status: DC
  Administered 2014-10-28 – 2014-10-29 (×2): 10 mg via ORAL
  Filled 2014-10-28 (×2): qty 1

## 2014-10-28 MED ORDER — NICOTINE 21 MG/24HR TD PT24
21.0000 mg | MEDICATED_PATCH | Freq: Every day | TRANSDERMAL | Status: DC
  Administered 2014-10-28 – 2014-10-30 (×3): 21 mg via TRANSDERMAL
  Filled 2014-10-28 (×2): qty 1

## 2014-10-28 MED ORDER — MAGNESIUM HYDROXIDE 400 MG/5ML PO SUSP: 30.0000 mL | Freq: Every day | ORAL | Status: DC | PRN

## 2014-10-28 MED ORDER — ACETAMINOPHEN 325 MG PO TABS
650.0000 mg | ORAL_TABLET | Freq: Four times a day (QID) | ORAL | Status: DC | PRN
  Administered 2014-10-28 – 2014-10-30 (×3): 650 mg via ORAL
  Filled 2014-10-28 (×3): qty 2

## 2014-10-28 NOTE — Progress Notes (Signed)
Pt came to the observation unit after having si thoughts for a week. Pt denies si in the obs unit. Pt reports that he wants medication "to stay calm" "to relax me." Pt says that he has mood swings and anxiety. Pt is sleepy on admission. He reports voices sometimes. He states "good voices." and "voices that are not good." Pt said that he saw a trailor and wanted it. The voices said go get it so he did. Pt reports that he is trying to get disability for "bulging disc, plantar wart on his foot and bipolar disorder. He denies si and hi.

## 2014-10-28 NOTE — BHH Counselor (Signed)
Referral packet sent to [place], Rolla Flatten, Ahmed Prima, [address], [place], [place], and [place]. Myrah Strawderman, NCC, LCAS-A, LPC-A  Counselor 10/28/2014 2:34 AM

## 2014-10-28 NOTE — BH Assessment (Signed)
[facility] Assessment Progress Note  Patient was seen this date by this writer and Earlene Plater NP to assess on admission to Observation Unit. Patient stated he still had thoughts of self harm and access to means. Patient also admitted to ETOH and cocaine use on 10/2 where he reported using 2 40 oz. beers and "some cocaine." Patient was very distraught at the time of the assessment and tearful as he spoke about the problems he has been having with his family. Patient also stated that he has discontinued his MH medications approximately two weeks ago reporting his symptoms have increased. Patient is open to medication interventions and will be referred to [facility] for after care services upon discharged. Patient will be re-evaluated in the a.m.

## 2014-10-28 NOTE — H&P (Signed)
[facility] H&P  Patient Identification: Lonnie [name] Date of Evaluation:  10/28/2014 Chief Complaint:  "I go through periods of depression and suicidal thoughts."  Principal Diagnosis: MDD (major depressive disorder), recurrent, severe, with psychosis ([place]) Diagnosis:   Patient Active Problem List   Diagnosis Date Noted  . Depression [F32.9] 10/28/2014  . MDD (major depressive disorder), recurrent, severe, with psychosis ([place]) [F33.3] 07/28/2014  . Slurred speech [R47.81] 08/09/2013  . TIA (transient ischemic attack) [G45.9] 08/09/2013  . Polyuria [R35.8] 08/09/2013  . Hematemesis [K92.0] 07/30/2013  . GI bleed [K92.2] 07/30/2013  . Schizoaffective disorder ([place]) [F25.9] 07/29/2013  . Major depression, recurrent ([place]) [F33.9] 07/25/2013  . Cocaine dependence ([place]) [F14.20] 07/25/2013  . Alcohol dependence with alcohol-induced mood disorder ([place]) [F10.24] 07/24/2013  . Depression, major, recurrent ([place]) [F33.9] 05/17/2013  . Bilateral foot pain [id number], M79.672] 11/25/2011  . Chronic low back pain [M54.5, G89.29] 10/15/2011   History of Present Illness::   Lonnie [name] is an 49 y.o.single male who walked to the [facility] to ask for help c/o suicidal ideation with a plan two days ago to shoot himself and a plan today to walk or jump into traffic to be hit.He reported giving his gun to a friend.  Patient reported that he has been having SI for about 4 weeks and has been on a drug and alcohol binge for 4 days.The patient tested 40 on BAL and + for cocaine on his UDS. Patient reported that he was told recently by his son's mother that he could no longer see his son and reported this event is what upset him and sent him into a depressive episode. Patient stated during assessment "I think my main problem is depression. I have been on Schizophrenia medicine before. I am very upset over having problems with my ex. It's hard whenever I think about her taking the kids from  me. I guess the drugs and booze are a distraction. I am not hallucinating today. Sometimes I hear good and bad voices. But not now. I just know I am really depressed. I get irritable, feel hopeless, feel suicidal. " The patient showed no evidence of responding to internal stimuli during the assessment. He did become very emotional when speaking about the situation with his ex. The patient feels that he would benefit from an antidepressant and also wants to "lets the drugs get out of my body." Patient reported using two 40 ounce beers and some cocaine on 10/27/2014. He reports taking Risperdal in the past but reports it was not helpful. He will be referred to [facility] for after care services and will offer resources to address his substance abuse. There were no active signs or symptoms of withdrawal noted during the assessment. His vital signs are currently within normal limits.   Associated Signs/Symptoms: Depression Symptoms:  depressed mood, anhedonia, psychomotor retardation, fatigue, feelings of worthlessness/guilt, difficulty concentrating, hopelessness, recurrent thoughts of death, anxiety, loss of energy/fatigue, weight loss, (Hypo) Manic Symptoms:  Irritable Mood, Labiality of Mood, Anxiety Symptoms:  Excessive Worry, Psychotic Symptoms:  Denies at the time assessment was conducted  PTSD Symptoms: Negative Total Time spent with patient: 45 minutes  Past Psychiatric History: Reports a history of Schizophrenia   Risk to Self:   Risk to Others:   Prior Inpatient Therapy:   Prior Outpatient Therapy:    Alcohol Screening: 1. How often do you have a drink containing alcohol?: 4 or more times a week 2. How many drinks containing alcohol  do you have on a typical day when you are drinking?: 1 or 2 (40 oz) 3. How often do you have six or more drinks on one occasion?: Never Preliminary Score: 0 9. Have you or someone else been injured as a result of your drinking?: No 10. Has a relative  or friend or a doctor or another health worker been concerned about your drinking or suggested you cut down?: Yes, during the last year Alcohol Use Disorder Identification Test Final Score (AUDIT): 8 Brief Intervention: Yes Substance Abuse History in the last 12 months:  Yes.   Consequences of Substance Abuse: Worsening of depressive symptoms Previous Psychotropic Medications: Yes  Psychological Evaluations: Yes  Past Medical History:  Past Medical History  Diagnosis Date  . GERD (gastroesophageal reflux disease)   . Depression   . Lumbago   . ETOH abuse   . Cocaine abuse   . Xanax use disorder, mild, abuse   . Marijuana abuse   . Schizophrenia ([place])   . Hypertension     pt reports that he has hx    Past Surgical History  Procedure Laterality Date  . Back surgery    . Neck surgery    . Esophagogastroduodenoscopy (egd) with propofol N/A 07/30/2013    Procedure: ESOPHAGOGASTRODUODENOSCOPY (EGD) WITH PROPOFOL;  Surgeon: Missy Sabins, MD;  Location: [facility] ENDOSCOPY;  Service: Endoscopy;  Laterality: N/A;  . Hernia repair Right 08/23/2014     at [facility]    Family History:  Family History  Problem Relation Age of Onset  . Diabetes Mother   . Hypertension Mother   . Hyperlipidemia Father   . Heart attack Neg Hx   . Sudden death Neg Hx    Social History:  History  Alcohol Use  . Yes    Comment: 3-4 40 oz per day     History  Drug Use  . Yes  . Special: Cocaine    Comment: crack use    Social History   Social History  . Marital Status: Single    Spouse Name: N/A  . Number of Children: N/A  . Years of Education: N/A   Social History Main Topics  . Smoking status: Current Some Day Smoker -- 0.00 packs/day for 0 years    Types: Cigarettes  . Smokeless tobacco: Never Used  . Alcohol Use: Yes     Comment: 3-4 40 oz per day  . Drug Use: Yes    Special: Cocaine     Comment: crack use  . Sexual Activity: Not on file     Comment: sober x 7 months   Other  Topics Concern  . Not on file   Social History Narrative   Pt reports that he lives with his partner and their kids near [place] until recently moving to the [facility] house a week ago for residential substance abuse treatment. He reports a history of smoking cigarettes and significant alcohol consumption but has stopped since admission to [facility]. He has reports being clean from cocaine for the last 23 days. He has been unemployed for the past 3 years.    Additional Social History:    History of alcohol / drug use?: Yes                    Allergies:   Allergies  Allergen Reactions  . Morphine And Related Nausea And Vomiting   Lab Results:  Results for orders placed or performed during the hospital encounter of 10/27/14 (  from the past 48 hour(s))  Urine rapid drug screen (hosp performed) (Not at [facility])     Status: Abnormal   Collection Time: 10/27/14  7:03 PM  Result Value Ref Range   Opiates NONE DETECTED NONE DETECTED   Cocaine POSITIVE (A) NONE DETECTED   Benzodiazepines NONE DETECTED NONE DETECTED   Amphetamines NONE DETECTED NONE DETECTED   Tetrahydrocannabinol NONE DETECTED NONE DETECTED   Barbiturates NONE DETECTED NONE DETECTED    Comment:        DRUG SCREEN FOR MEDICAL PURPOSES ONLY.  IF CONFIRMATION IS NEEDED FOR ANY PURPOSE, NOTIFY LAB WITHIN 5 DAYS.        LOWEST DETECTABLE LIMITS FOR URINE DRUG SCREEN Drug Class       Cutoff (ng/mL) Amphetamine      1000 Barbiturate      200 Benzodiazepine   628 Tricyclics       315 Opiates          300 Cocaine          300 THC              50   Comprehensive metabolic panel     Status: Abnormal   Collection Time: 10/27/14  7:12 PM  Result Value Ref Range   Sodium 132 (L) 135 - 145 mmol/L   Potassium 4.0 3.5 - 5.1 mmol/L   Chloride 99 (L) 101 - 111 mmol/L   CO2 24 22 - 32 mmol/L   Glucose, Bld 82 65 - 99 mg/dL   BUN 8 6 - 20 mg/dL   Creatinine, Ser 1.37 (H) 0.61 - 1.24 mg/dL   Calcium 8.7 (L) 8.9 - 10.3 mg/dL    Total Protein 6.6 6.5 - 8.1 g/dL   Albumin 3.9 3.5 - 5.0 g/dL   AST 48 (H) 15 - 41 U/L   ALT 30 17 - 63 U/L   Alkaline Phosphatase 47 38 - 126 U/L   Total Bilirubin 0.8 0.3 - 1.2 mg/dL   GFR calc non Af Amer 60 (L) >60 mL/min   GFR calc Af Amer >60 >60 mL/min    Comment: (NOTE) The eGFR has been calculated using the CKD EPI equation. This calculation has not been validated in all clinical situations. eGFR's persistently <60 mL/min signify possible Chronic Kidney Disease.    Anion gap 9 5 - 15  CBC     Status: None   Collection Time: 10/27/14  7:12 PM  Result Value Ref Range   WBC 6.7 4.0 - 10.5 K/uL   RBC 4.98 4.22 - 5.81 MIL/uL   Hemoglobin 14.0 13.0 - 17.0 g/dL   HCT 42.1 39.0 - 52.0 %   MCV 84.5 78.0 - 100.0 fL   MCH 28.1 26.0 - 34.0 pg   MCHC 33.3 30.0 - 36.0 g/dL   RDW 13.7 11.5 - 15.5 %   Platelets 327 150 - 400 K/uL  Ethanol (ETOH)     Status: Abnormal   Collection Time: 10/27/14  7:13 PM  Result Value Ref Range   Alcohol, Ethyl (B) 40 (H) <5 mg/dL    Comment:        LOWEST DETECTABLE LIMIT FOR SERUM ALCOHOL IS 5 mg/dL FOR MEDICAL PURPOSES ONLY   Salicylate level     Status: None   Collection Time: 10/27/14  7:13 PM  Result Value Ref Range   Salicylate Lvl <1.7 2.8 - 30.0 mg/dL  Acetaminophen level     Status: Abnormal   Collection Time: 10/27/14  7:13 PM  Result Value Ref Range   Acetaminophen (Tylenol), Serum <10 (L) 10 - 30 ug/mL    Comment:        THERAPEUTIC CONCENTRATIONS VARY SIGNIFICANTLY. A RANGE OF 10-30 ug/mL MAY BE AN EFFECTIVE CONCENTRATION FOR MANY PATIENTS. HOWEVER, SOME ARE BEST TREATED AT CONCENTRATIONS OUTSIDE THIS RANGE. ACETAMINOPHEN CONCENTRATIONS >150 ug/mL AT 4 HOURS AFTER INGESTION AND >50 ug/mL AT 12 HOURS AFTER INGESTION ARE OFTEN ASSOCIATED WITH TOXIC REACTIONS.     Metabolic Disorder Labs:  Lab Results  Component Value Date   HGBA1C 5.9* 08/10/2013   MPG 123* 08/10/2013   MPG 111 08/09/2013   No results found  for: PROLACTIN No results found for: CHOL, TRIG, HDL, CHOLHDL, VLDL, LDLCALC  Current Medications: Current Facility-Administered Medications  Medication Dose Route Frequency Provider Last Rate Last Dose  . acetaminophen (TYLENOL) tablet 650 mg  650 mg Oral Q6H PRN Niel Hummer, NP      . alum & mag hydroxide-simeth (MAALOX/MYLANTA) 200-200-20 MG/5ML suspension 30 mL  30 mL Oral Q4H PRN Niel Hummer, NP      . citalopram (CELEXA) tablet 10 mg  10 mg Oral Daily Niel Hummer, NP      . magnesium hydroxide (MILK OF MAGNESIA) suspension 30 mL  30 mL Oral Daily PRN Niel Hummer, NP      . nicotine (NICODERM CQ - dosed in mg/24 hours) patch 21 mg  21 mg Transdermal Daily Niel Hummer, NP   21 mg at 10/28/14 1457  . traZODone (DESYREL) tablet 50 mg  50 mg Oral QHS PRN Niel Hummer, NP       PTA Medications: Prescriptions prior to admission  Medication Sig Dispense Refill Last Dose  . acetaminophen (TYLENOL) 500 MG tablet Take 1,500 mg by mouth 4 (four) times daily as needed (pain).   couple days ago  . HYDROcodone-acetaminophen (NORCO/VICODIN) 5-325 MG per tablet Take 1-2 tablets by mouth every 6 (six) hours as needed. (Patient not taking: Reported on 10/27/2014) 6 tablet 0 Not Taking at Unknown time  . Multiple Vitamin (MULTIVITAMIN WITH MINERALS) TABS tablet Take 1 tablet by mouth daily. (Patient not taking: Reported on 08/06/2014)   Not Taking at Unknown time  . polyethylene glycol (MIRALAX) packet Take 17 g by mouth daily. (Patient not taking: Reported on 10/27/2014) 14 each 0 Not Taking at Unknown time  . QUEtiapine (SEROQUEL) 200 MG tablet Take 1 tablet (200 mg total) by mouth at bedtime. (Patient not taking: Reported on 10/27/2014)   Not Taking at Unknown time  . risperiDONE (RISPERDAL) 2 MG tablet Take 4 mg by mouth at bedtime.    week ago  . traZODone (DESYREL) 50 MG tablet Take 50 mg by mouth at bedtime.   week ago    Musculoskeletal: Strength & Muscle Tone: within normal limits Gait &  Station: normal Patient leans: N/A  Psychiatric Specialty Exam: Physical Exam  Review of Systems  Constitutional: Negative.   HENT: Negative.   Eyes: Negative.   Respiratory: Negative.   Cardiovascular: Negative.   Gastrointestinal: Negative.   Genitourinary: Negative.   Musculoskeletal: Negative.   Skin: Negative.   Neurological: Negative.   Endo/Heme/Allergies: Negative.   Psychiatric/Behavioral: Positive for depression, suicidal ideas and substance abuse. Negative for hallucinations and memory loss. The patient is nervous/anxious. The patient does not have insomnia.     Blood pressure 126/63, pulse 71, temperature 98.2 F (36.8 C), temperature source Oral, resp. rate 16, height 5' 9"  (1.753 m), weight 80.74 kg (  178 lb).Body mass index is 26.27 kg/(m^2).  General Appearance: Disheveled  Eye Sport and exercise psychologist::  Fair  Speech:  Clear and Coherent  Volume:  Normal  Mood:  Dysphoric  Affect:  Tearful  Thought Process:  Coherent  Orientation:  Full (Time, Place, and Person)  Thought Content:  Rumination  Suicidal Thoughts:  Yes.  with intent/plan  Homicidal Thoughts:  No  Memory:  Immediate;   Good Recent;   Good Remote;   Good  Judgement:  Impaired  Insight:  Shallow  Psychomotor Activity:  Decreased  Concentration:  Fair  Recall:  [place] of Knowledge:Good  Language: Good  Akathisia:  No  Handed:  Right  AIMS (if indicated):     Assets:  Communication Skills Desire for Improvement Physical Health Resilience  ADL's:  Intact  Cognition: WNL  Sleep:      Treatment Plan Summary: Admit to [facility] for further evaluation  Observation Level/Precautions:  Continuous Observation  Laboratory:  CBC Chemistry Profile UDS Alcohol level   Psychotherapy:  Individual Therapy while in OBS  Medications:  Start Celexa 10 mg daily for depression  Consultations:  As needed  Discharge Concerns: Continued substance abuse  Estimated LOS: Less than 24 hours  Other:      Brecklynn Jian, NP-C 10/3/20164:05 PM

## 2014-10-28 NOTE — Progress Notes (Signed)
Pt's case discussed with Dr. Lucianne Muss, and L.Earlene Plater NP. Dr. Lucianne Muss has accepted pt to [facility] Obs unit bed 1. Report # for RN is [phone]. Admission is voluntary, pt can arrive anytime. Spoke with [facility] re: pt's Obs unit placement.  Ilean Skill, MSW, LCSW Clinical Social Work, Disposition  10/28/2014 [phone]

## 2014-10-29 DIAGNOSIS — F333 Major depressive disorder, recurrent, severe with psychotic symptoms: Secondary | ICD-10-CM | POA: Diagnosis not present

## 2014-10-29 DIAGNOSIS — R45851 Suicidal ideations: Secondary | ICD-10-CM | POA: Diagnosis not present

## 2014-10-29 MED ORDER — CITALOPRAM HYDROBROMIDE 20 MG PO TABS
20.0000 mg | ORAL_TABLET | Freq: Every day | ORAL | Status: DC
  Administered 2014-10-30: 20 mg via ORAL
  Filled 2014-10-29: qty 1

## 2014-10-29 MED ORDER — HYDROXYZINE HCL 25 MG PO TABS
25.0000 mg | ORAL_TABLET | Freq: Four times a day (QID) | ORAL | Status: DC | PRN
  Administered 2014-10-29 (×2): 25 mg via ORAL
  Filled 2014-10-29 (×2): qty 1

## 2014-10-29 MED ORDER — IBUPROFEN 600 MG PO TABS
600.0000 mg | ORAL_TABLET | Freq: Four times a day (QID) | ORAL | Status: DC | PRN
  Administered 2014-10-29: 600 mg via ORAL

## 2014-10-29 MED ORDER — IBUPROFEN 600 MG PO TABS
ORAL_TABLET | ORAL | Status: AC
  Filled 2014-10-29: qty 1

## 2014-10-29 NOTE — BHH Counselor (Signed)
Spoke with patient. Patient agrees to go to [place] upon d/c and acknowledged that his stabilization with medication is important. Patient stated that he would connect with [facility] to seek in patient treatment for stabilization. Upon request this writer placed AA list meetings in patient chart in [facility] OBS unit for use. Brinleigh Tew K. [facility], NCC, LPC-A, LCAS-A  Counselor 10/29/2014 7:37 PM

## 2014-10-29 NOTE — Progress Notes (Signed)
D: Pt A & O X 4. Denied SI, HI, AVH and pain when assessed. Pt later reported to writer that he was experiencing SI and feeling "shaky". Cooperative with care and unit routines thus far this shift.  A: 1:1 contact made with pt to conduct shift assessment and to provide continued support and availability throughout this shift. All medications administered as prescribed including PRN Vistaril for c/o anxiety ("shaky") related to an incident in unit earlier this shift. Encouragement offered to voice concerns and needs. Q 15 minutes checks maintained for safety as ordered without behavioral outburst.  R: Pt responsive to care. Compliant with medications as prescribed. Denies adverse drug reactions when assessed. Will continue to monitor pt for safety and stabilization.

## 2014-10-29 NOTE — BHH Counselor (Signed)
Counselor spoke with Lonnie Kaufmann, NP who agrees to allow patient to spend one more night in the observation unit to discharge tomorrow to [facility]. Spoke with patient once he woke up and he is agreeable to plan. Patient states that he would like a bus pass in the morning so that he can leave here and follow up with [facility]. Patient states that he has been seen by [facility] in the past and he feels comfortable with this plan. Will inform [facility] of patients request for a bus pass to [facility] tomorrow morning.   Spoke with [name], RN, [facility] regarding patients bus pass for tomorrow.  Lonnie Poke, LCSW Therapeutic Triage Specialist [facility] 10/29/2014 6:53 PM

## 2014-10-29 NOTE — Progress Notes (Signed)
[facility]-Observation Progress Note  10/29/2014 1:20 PM Lonnie [name]  MRN:  [id number] Subjective:   Patient states "I am still depressed. It is a seven. I don't know if I will be safe. I have been sleeping under a bridge. I am really not doing well. I have been so depressed. I had thoughts previously of shooting myself with a gun. I have been using drugs to cope, which has made me more depressed. When I think about what happened with my ex I cry and feel very hopeless."    Objective:  Patient seen and chart is reviewed. The patient reports continued symptoms of depression, which is consistent with his presentation. He continues to deny hearing voices during follow up assessment but reports he has in the past. Patient identifies his main stressor in addition to depressive symptom is housing. The patient is still having suicidal thoughts and cannot contract for his safety. Roy became further upset after an incident with peer but was able to calm down with a prn dose of vistaril. The patient has recently been using cocaine and alcohol with last use on 10/27/2014. He is currently denying any withdrawal symptoms.   Principal Problem: MDD (major depressive disorder), recurrent, severe, with psychosis ([place]) Diagnosis:   Patient Active Problem List   Diagnosis Date Noted  . Depression [F32.9] 10/28/2014  . MDD (major depressive disorder), recurrent, severe, with psychosis ([place]) [F33.3] 07/28/2014  . Slurred speech [R47.81] 08/09/2013  . TIA (transient ischemic attack) [G45.9] 08/09/2013  . Polyuria [R35.8] 08/09/2013  . Hematemesis [K92.0] 07/30/2013  . GI bleed [K92.2] 07/30/2013  . Schizoaffective disorder ([place]) [F25.9] 07/29/2013  . Major depression, recurrent ([place]) [F33.9] 07/25/2013  . Cocaine dependence ([place]) [F14.20] 07/25/2013  . Alcohol dependence with alcohol-induced mood disorder ([place]) [F10.24] 07/24/2013  . Depression, major, recurrent ([place]) [F33.9] 05/17/2013  . Bilateral foot pain  [id number], M79.672] 11/25/2011  . Chronic low back pain [M54.5, G89.29] 10/15/2011   Total Time spent with patient: 30 minutes  Past Psychiatric History: MDD, Cocaine dependence, Alcohol abuse   Past Medical History:  Past Medical History  Diagnosis Date  . GERD (gastroesophageal reflux disease)   . Depression   . Lumbago   . ETOH abuse   . Cocaine abuse   . Xanax use disorder, mild, abuse   . Marijuana abuse   . Schizophrenia ([place])   . Hypertension     pt reports that he has hx    Past Surgical History  Procedure Laterality Date  . Back surgery    . Neck surgery    . Esophagogastroduodenoscopy (egd) with propofol N/A 07/30/2013    Procedure: ESOPHAGOGASTRODUODENOSCOPY (EGD) WITH PROPOFOL;  Surgeon: Missy Sabins, MD;  Location: [facility] ENDOSCOPY;  Service: Endoscopy;  Laterality: N/A;  . Hernia repair Right 08/23/2014     at [facility]    Family History:  Family History  Problem Relation Age of Onset  . Diabetes Mother   . Hypertension Mother   . Hyperlipidemia Father   . Heart attack Neg Hx   . Sudden death Neg Hx    Social History:  History  Alcohol Use  . Yes    Comment: 3-4 40 oz per day     History  Drug Use  . Yes  . Special: Cocaine    Comment: crack use    Social History   Social History  . Marital Status: Single    Spouse Name: N/A  . Number of Children: N/A  . Years of  Education: N/A   Social History Main Topics  . Smoking status: Current Some Day Smoker -- 0.00 packs/day for 0 years    Types: Cigarettes  . Smokeless tobacco: Never Used  . Alcohol Use: Yes     Comment: 3-4 40 oz per day  . Drug Use: Yes    Special: Cocaine     Comment: crack use  . Sexual Activity: Not on file     Comment: sober x 7 months   Other Topics Concern  . Not on file   Social History Narrative   Pt reports that he lives with his partner and their kids near [place] until recently moving to the [facility] house a week ago for residential substance abuse  treatment. He reports a history of smoking cigarettes and significant alcohol consumption but has stopped since admission to [facility]. He has reports being clean from cocaine for the last 23 days. He has been unemployed for the past 3 years.    Additional Social History:    History of alcohol / drug use?: Yes                    Sleep: Fair  Appetite:  Fair  Current Medications: Current Facility-Administered Medications  Medication Dose Route Frequency Provider Last Rate Last Dose  . acetaminophen (TYLENOL) tablet 650 mg  650 mg Oral Q6H PRN Niel Hummer, NP   650 mg at 10/28/14 2239  . alum & mag hydroxide-simeth (MAALOX/MYLANTA) 200-200-20 MG/5ML suspension 30 mL  30 mL Oral Q4H PRN Niel Hummer, NP      . citalopram (CELEXA) tablet 10 mg  10 mg Oral Daily Niel Hummer, NP   10 mg at 10/29/14 0830  . hydrOXYzine (ATARAX/VISTARIL) tablet 25 mg  25 mg Oral Q6H PRN Niel Hummer, NP   25 mg at 10/29/14 1212  . ibuprofen (ADVIL,MOTRIN) tablet 600 mg  600 mg Oral Q6H PRN Laverle Hobby, PA-C   600 mg at 10/29/14 0657  . magnesium hydroxide (MILK OF MAGNESIA) suspension 30 mL  30 mL Oral Daily PRN Niel Hummer, NP      . nicotine (NICODERM CQ - dosed in mg/24 hours) patch 21 mg  21 mg Transdermal Daily Niel Hummer, NP   21 mg at 10/29/14 0800  . traZODone (DESYREL) tablet 50 mg  50 mg Oral QHS PRN Niel Hummer, NP   50 mg at 10/28/14 2239    Lab Results:  Results for orders placed or performed during the hospital encounter of 10/27/14 (from the past 48 hour(s))  Urine rapid drug screen (hosp performed) (Not at [facility])     Status: Abnormal   Collection Time: 10/27/14  7:03 PM  Result Value Ref Range   Opiates NONE DETECTED NONE DETECTED   Cocaine POSITIVE (A) NONE DETECTED   Benzodiazepines NONE DETECTED NONE DETECTED   Amphetamines NONE DETECTED NONE DETECTED   Tetrahydrocannabinol NONE DETECTED NONE DETECTED   Barbiturates NONE DETECTED NONE DETECTED    Comment:         DRUG SCREEN FOR MEDICAL PURPOSES ONLY.  IF CONFIRMATION IS NEEDED FOR ANY PURPOSE, NOTIFY LAB WITHIN 5 DAYS.        LOWEST DETECTABLE LIMITS FOR URINE DRUG SCREEN Drug Class       Cutoff (ng/mL) Amphetamine      1000 Barbiturate      200 Benzodiazepine   379 Tricyclics       024 Opiates  300 Cocaine          300 THC              50   Comprehensive metabolic panel     Status: Abnormal   Collection Time: 10/27/14  7:12 PM  Result Value Ref Range   Sodium 132 (L) 135 - 145 mmol/L   Potassium 4.0 3.5 - 5.1 mmol/L   Chloride 99 (L) 101 - 111 mmol/L   CO2 24 22 - 32 mmol/L   Glucose, Bld 82 65 - 99 mg/dL   BUN 8 6 - 20 mg/dL   Creatinine, Ser 1.37 (H) 0.61 - 1.24 mg/dL   Calcium 8.7 (L) 8.9 - 10.3 mg/dL   Total Protein 6.6 6.5 - 8.1 g/dL   Albumin 3.9 3.5 - 5.0 g/dL   AST 48 (H) 15 - 41 U/L   ALT 30 17 - 63 U/L   Alkaline Phosphatase 47 38 - 126 U/L   Total Bilirubin 0.8 0.3 - 1.2 mg/dL   GFR calc non Af Amer 60 (L) >60 mL/min   GFR calc Af Amer >60 >60 mL/min    Comment: (NOTE) The eGFR has been calculated using the CKD EPI equation. This calculation has not been validated in all clinical situations. eGFR's persistently <60 mL/min signify possible Chronic Kidney Disease.    Anion gap 9 5 - 15  CBC     Status: None   Collection Time: 10/27/14  7:12 PM  Result Value Ref Range   WBC 6.7 4.0 - 10.5 K/uL   RBC 4.98 4.22 - 5.81 MIL/uL   Hemoglobin 14.0 13.0 - 17.0 g/dL   HCT 42.1 39.0 - 52.0 %   MCV 84.5 78.0 - 100.0 fL   MCH 28.1 26.0 - 34.0 pg   MCHC 33.3 30.0 - 36.0 g/dL   RDW 13.7 11.5 - 15.5 %   Platelets 327 150 - 400 K/uL  Ethanol (ETOH)     Status: Abnormal   Collection Time: 10/27/14  7:13 PM  Result Value Ref Range   Alcohol, Ethyl (B) 40 (H) <5 mg/dL    Comment:        LOWEST DETECTABLE LIMIT FOR SERUM ALCOHOL IS 5 mg/dL FOR MEDICAL PURPOSES ONLY   Salicylate level     Status: None   Collection Time: 10/27/14  7:13 PM  Result Value Ref Range    Salicylate Lvl <7.8 2.8 - 30.0 mg/dL  Acetaminophen level     Status: Abnormal   Collection Time: 10/27/14  7:13 PM  Result Value Ref Range   Acetaminophen (Tylenol), Serum <10 (L) 10 - 30 ug/mL    Comment:        THERAPEUTIC CONCENTRATIONS VARY SIGNIFICANTLY. A RANGE OF 10-30 ug/mL MAY BE AN EFFECTIVE CONCENTRATION FOR MANY PATIENTS. HOWEVER, SOME ARE BEST TREATED AT CONCENTRATIONS OUTSIDE THIS RANGE. ACETAMINOPHEN CONCENTRATIONS >150 ug/mL AT 4 HOURS AFTER INGESTION AND >50 ug/mL AT 12 HOURS AFTER INGESTION ARE OFTEN ASSOCIATED WITH TOXIC REACTIONS.     Physical Findings: AIMS: Facial and Oral Movements Muscles of Facial Expression: None, normal Lips and Perioral Area: None, normal Jaw: None, normal Tongue: None, normal,Extremity Movements Upper (arms, wrists, hands, fingers): None, normal Lower (legs, knees, ankles, toes): None, normal, Trunk Movements Neck, shoulders, hips: None, normal, Overall Severity Severity of abnormal movements (highest score from questions above): None, normal Incapacitation due to abnormal movements: None, normal Patient's awareness of abnormal movements (rate only patient's report): No Awareness, Dental Status Current problems with teeth and/or  dentures?: No Does patient usually wear dentures?: No  CIWA:  CIWA-Ar Total: 0 COWS:  COWS Total Score: 0  Musculoskeletal: Strength & Muscle Tone: within normal limits Gait & Station: normal Patient leans: N/A  Psychiatric Specialty Exam: Review of Systems  Constitutional: Negative.   HENT: Negative.   Eyes: Negative.   Respiratory: Negative.   Cardiovascular: Negative.   Gastrointestinal: Negative.   Genitourinary: Negative.   Musculoskeletal: Negative.   Skin: Negative.   Neurological: Negative.   Endo/Heme/Allergies: Negative.   Psychiatric/Behavioral: Positive for depression, suicidal ideas and substance abuse. Negative for hallucinations and memory loss. The patient is  nervous/anxious and has insomnia.     Blood pressure 125/101, pulse 72, temperature 97.7 F (36.5 C), temperature source Oral, resp. rate 16, height 5' 9" (1.753 m), weight 80.74 kg (178 lb), SpO2 100 %.Body mass index is 26.27 kg/(m^2).  General Appearance: Disheveled  Eye Sport and exercise psychologist::  Fair  Speech:  Clear and Coherent and Slow  Volume:  Decreased  Mood:  Dysphoric and Hopeless  Affect:  Tearful  Thought Process:  Coherent  Orientation:  Full (Time, Place, and Person)  Thought Content:  Rumination  Suicidal Thoughts:  Yes.  without intent/plan  Homicidal Thoughts:  No  Memory:  Immediate;   Good Recent;   Fair Remote;   Fair  Judgement:  Fair  Insight:  Shallow  Psychomotor Activity:  Decreased  Concentration:  Fair  Recall:  [place] of Knowledge:Good  Language: Good  Akathisia:  No  Handed:  Right  AIMS (if indicated):     Assets:  Communication Skills Desire for Improvement Leisure Time Physical Health Resilience  ADL's:  Intact  Cognition: WNL  Sleep:      Treatment Plan Summary: After discussion with Dr. Dwyane Dee the patient will remain in the [place] to be reevaluated in the am. -Increase Celexa to 20 mg daily for depression -Start Vistaril 25 mg every six hours prn anxiety  Elvena Oyer, NP-C 10/29/2014, 1:20 PM

## 2014-10-29 NOTE — Progress Notes (Signed)
D: Lonnie [name] is still depressed and anxious about being discharged tomorrow. He rates anxiety 8/10 Depression 8/10. He is to be discharged to [facility] however, he states he does not know where he will be able to stay if [facility] does not accept him. He was previously staying with his grandparents prior to this admission but he states they will likely not let him return there although he has not spoken with them. He says "I don't want to die, I want to live". Endorsing constant thoughts of destructive behaviors during the day prior to this admission. He seems to think the medications that he routinely takes at night are not helping with these thoughts during the day. He was encouraged to speak with his psychiatrist at [facility] regarding adjusting his medications to help with his labile moods. He endorses going to church and talking to God. Encouraged him to continue to rely on his faith and seek out church members he may be close to for support systems. He has a 65 year old son and 42 year old daughter. They do not live with him and at this time their mother is not allowing him to see them. I encouraged him to use this as motivation for getting clean. A: Encouragement and support given throughout shift. R: Will continue to monitor for patient safety.

## 2014-10-29 NOTE — [facility] Counselor (Signed)
Patient states that he is "shaky" due to the incedent earlier with another patient. Patient states that he does not feel like harming himself but wanted to make staff aware that he feels "shaky." Informed NP who states that the nurse can give patient a PRN for anxiety.  Informed patient nurse. Asked patient if he felt okay leaving if he could get some rest and leave later and patient states that he doesn't know. Patient states that he went to [facility] previously and is open to returning there for treatment.    Davina Poke, LCSW Therapeutic Triage Specialist [facility] 10/29/2014 12:16 PM'

## 2014-10-30 MED ORDER — CITALOPRAM HYDROBROMIDE 20 MG PO TABS: 20.0000 mg | ORAL_TABLET | Freq: Every day | ORAL | Status: DC

## 2014-10-30 NOTE — Discharge Summary (Signed)
Physician Discharge Summary Note  Patient:  Lonnie [name] is an 49 y.o., male MRN:  [id number] DOB:  1965/09/11 Patient phone:  [phone] (home)  Patient address:   [place] [address],  Total Time spent with patient: 45 minutes  Date of Admission:  10/28/2014 Date of Discharge: 10/30/2014  Reason for Admission:  Substance abuse and depression  Principal Problem: MDD (major depressive disorder), recurrent, severe, with psychosis [facility]) Discharge Diagnoses: Patient Active Problem List   Diagnosis Date Noted  . Depression [F32.9] 10/28/2014  . MDD (major depressive disorder), recurrent, severe, with psychosis ([place]) [F33.3] 07/28/2014  . Slurred speech [R47.81] 08/09/2013  . TIA (transient ischemic attack) [G45.9] 08/09/2013  . Polyuria [R35.8] 08/09/2013  . Hematemesis [K92.0] 07/30/2013  . GI bleed [K92.2] 07/30/2013  . Schizoaffective disorder ([place]) [F25.9] 07/29/2013  . Major depression, recurrent ([place]) [F33.9] 07/25/2013  . Cocaine dependence ([place]) [F14.20] 07/25/2013  . Alcohol dependence with alcohol-induced mood disorder ([place]) [F10.24] 07/24/2013  . Depression, major, recurrent ([place]) [F33.9] 05/17/2013  . Bilateral foot pain [id number], M79.672] 11/25/2011  . Chronic low back pain [M54.5, G89.29] 10/15/2011    Musculoskeletal: Strength & Muscle Tone: within normal limits Gait & Station: normal Patient leans: N/A  Psychiatric Specialty Exam: Physical Exam  Vitals reviewed.   ROS  Blood pressure 117/82, pulse 77, temperature 98.8 F (37.1 C), temperature source Oral, resp. rate 16, height _0  (1.753 m), weight 80.74 kg (178 lb), SpO2 100 %.Body mass index is 26.27 kg/(m^2).   General Appearance: Disheveled  Eye Sport and exercise psychologist::  Fair  Speech:  Clear and Coherent  Volume:  Normal  Mood:  Dysphoric  Affect:  Tearful  Thought Process:  Coherent  Orientation:  Full (Time, Place, and Person)  Thought Content:  Rumination  Suicidal Thoughts:  Yes.   with intent/plan  Homicidal Thoughts:  No  Memory:  Immediate;   Good Recent;   Good Remote;   Good  Judgement:  Impaired  Insight:  Shallow  Psychomotor Activity:  Decreased  Concentration:  Fair  Recall:  [place] of Knowledge:Good  Language: Good  Akathisia:  No  Handed:  Right  AIMS (if indicated):     Assets:  Communication Skills Desire for Improvement Physical Health Resilience  ADL's:  Intact  Cognition: WNL  Sleep:   fair    Have you used any form of tobacco in the last 30 days? (Cigarettes, Smokeless Tobacco, Cigars, and/or Pipes): Yes  Has this patient used any form of tobacco in the last 30 days? (Cigarettes, Smokeless Tobacco, Cigars, and/or Pipes) N/A  Past Medical History:  Past Medical History  Diagnosis Date  . GERD (gastroesophageal reflux disease)   . Depression   . Lumbago   . ETOH abuse   . Cocaine abuse   . Xanax use disorder, mild, abuse   . Marijuana abuse   . Schizophrenia ([place])   . Hypertension     pt reports that he has hx    Past Surgical History  Procedure Laterality Date  . Back surgery    . Neck surgery    . Esophagogastroduodenoscopy (egd) with propofol N/A 07/30/2013    Procedure: ESOPHAGOGASTRODUODENOSCOPY (EGD) WITH PROPOFOL;  Surgeon: Missy Sabins, MD;  Location: [facility] ENDOSCOPY;  Service: Endoscopy;  Laterality: N/A;  . Hernia repair Right 08/23/2014     at [facility]    Family History:  Family History  Problem Relation Age of Onset  . Diabetes Mother   . Hypertension  Mother   . Hyperlipidemia Father   . Heart attack Neg Hx   . Sudden death Neg Hx    Social History:  History  Alcohol Use  . Yes    Comment: 3-4 40 oz per day     History  Drug Use  . Yes  . Special: Cocaine    Comment: crack use    Social History   Social History  . Marital Status: Single    Spouse Name: N/A  . Number of Children: N/A  . Years of Education: N/A   Social History Main Topics  . Smoking status: Current Some Day Smoker  -- 0.00 packs/day for 0 years    Types: Cigarettes  . Smokeless tobacco: Never Used  . Alcohol Use: Yes     Comment: 3-4 40 oz per day  . Drug Use: Yes    Special: Cocaine     Comment: crack use  . Sexual Activity: Not on file     Comment: sober x 7 months   Other Topics Concern  . Not on file   Social History Narrative   Pt reports that he lives with his partner and their kids near [place] until recently moving to the [facility] house a week ago for residential substance abuse treatment. He reports a history of smoking cigarettes and significant alcohol consumption but has stopped since admission to [facility]. He has reports being clean from cocaine for the last 23 days. He has been unemployed for the past 3 years.    \Risk to Self:   Risk to Others:   Prior Inpatient Therapy:   Prior Outpatient Therapy:    Level of Care:  OP  Hospital Course:  Lonnie [name] was admitted for MDD (major depressive disorder), recurrent, severe, with psychosis ([place]) and crisis management.  He was treated discharged with the medications listed below under Medication List.  Medical problems were identified and treated as needed.  Home medications were restarted as appropriate.  Improvement was monitored by observation and Lonnie [name] daily report of symptom reduction.  Emotional and mental status was monitored by daily self-inventory reports completed by Lonnie [name] and clinical staff.         Lonnie [name] was evaluated by the treatment team for stability and plans for continued recovery upon discharge.  Lonnie [name] motivation was an integral factor for scheduling further treatment.  Employment, transportation, bed availability, health status, family support, and any pending legal issues were also considered during his hospital stay.  He was offered further treatment options upon discharge including but not limited to Residential, Intensive Outpatient, and Outpatient treatment.  Lonnie [name] will  follow up with the services as listed below under Follow Up Information.     Upon completion of this admission the patient was both mentally and medically stable for discharge denying suicidal/homicidal ideation, auditory/visual/tactile hallucinations, delusional thoughts and paranoia.      Consults:  psychiatry  Significant Diagnostic Studies:  labs: per ED  Discharge Vitals:   Blood pressure 117/82, pulse 77, temperature 98.8 F (37.1 C), temperature source Oral, resp. rate 16, height _0  (1.753 m), weight 80.74 kg (178 lb), SpO2 100 %. Body mass index is 26.27 kg/(m^2). Lab Results:   Results for orders placed or performed during the hospital encounter of 10/27/14 (from the past 72 hour(s))  Urine rapid drug screen (hosp performed) (Not at [facility])     Status: Abnormal   Collection Time: 10/27/14  7:03 PM  Result Value Ref Range   Opiates NONE DETECTED NONE DETECTED   Cocaine POSITIVE (A) NONE DETECTED   Benzodiazepines NONE DETECTED NONE DETECTED   Amphetamines NONE DETECTED NONE DETECTED   Tetrahydrocannabinol NONE DETECTED NONE DETECTED   Barbiturates NONE DETECTED NONE DETECTED    Comment:        DRUG SCREEN FOR MEDICAL PURPOSES ONLY.  IF CONFIRMATION IS NEEDED FOR ANY PURPOSE, NOTIFY LAB WITHIN 5 DAYS.        LOWEST DETECTABLE LIMITS FOR URINE DRUG SCREEN Drug Class       Cutoff (ng/mL) Amphetamine      1000 Barbiturate      200 Benzodiazepine   502 Tricyclics       774 Opiates          300 Cocaine          300 THC              50   Comprehensive metabolic panel     Status: Abnormal   Collection Time: 10/27/14  7:12 PM  Result Value Ref Range   Sodium 132 (L) 135 - 145 mmol/L   Potassium 4.0 3.5 - 5.1 mmol/L   Chloride 99 (L) 101 - 111 mmol/L   CO2 24 22 - 32 mmol/L   Glucose, Bld 82 65 - 99 mg/dL   BUN 8 6 - 20 mg/dL   Creatinine, Ser 1.37 (H) 0.61 - 1.24 mg/dL   Calcium 8.7 (L) 8.9 - 10.3 mg/dL   Total Protein 6.6 6.5 - 8.1 g/dL   Albumin 3.9 3.5 - 5.0  g/dL   AST 48 (H) 15 - 41 U/L   ALT 30 17 - 63 U/L   Alkaline Phosphatase 47 38 - 126 U/L   Total Bilirubin 0.8 0.3 - 1.2 mg/dL   GFR calc non Af Amer 60 (L) >60 mL/min   GFR calc Af Amer >60 >60 mL/min    Comment: (NOTE) The eGFR has been calculated using the CKD EPI equation. This calculation has not been validated in all clinical situations. eGFR's persistently <60 mL/min signify possible Chronic Kidney Disease.    Anion gap 9 5 - 15  CBC     Status: None   Collection Time: 10/27/14  7:12 PM  Result Value Ref Range   WBC 6.7 4.0 - 10.5 K/uL   RBC 4.98 4.22 - 5.81 MIL/uL   Hemoglobin 14.0 13.0 - 17.0 g/dL   HCT 42.1 39.0 - 52.0 %   MCV 84.5 78.0 - 100.0 fL   MCH 28.1 26.0 - 34.0 pg   MCHC 33.3 30.0 - 36.0 g/dL   RDW 13.7 11.5 - 15.5 %   Platelets 327 150 - 400 K/uL  Ethanol (ETOH)     Status: Abnormal   Collection Time: 10/27/14  7:13 PM  Result Value Ref Range   Alcohol, Ethyl (B) 40 (H) <5 mg/dL    Comment:        LOWEST DETECTABLE LIMIT FOR SERUM ALCOHOL IS 5 mg/dL FOR MEDICAL PURPOSES ONLY   Salicylate level     Status: None   Collection Time: 10/27/14  7:13 PM  Result Value Ref Range   Salicylate Lvl <1.2 2.8 - 30.0 mg/dL  Acetaminophen level     Status: Abnormal   Collection Time: 10/27/14  7:13 PM  Result Value Ref Range   Acetaminophen (Tylenol), Serum <10 (L) 10 - 30 ug/mL    Comment:        THERAPEUTIC CONCENTRATIONS VARY  SIGNIFICANTLY. A RANGE OF 10-30 ug/mL MAY BE AN EFFECTIVE CONCENTRATION FOR MANY PATIENTS. HOWEVER, SOME ARE BEST TREATED AT CONCENTRATIONS OUTSIDE THIS RANGE. ACETAMINOPHEN CONCENTRATIONS >150 ug/mL AT 4 HOURS AFTER INGESTION AND >50 ug/mL AT 12 HOURS AFTER INGESTION ARE OFTEN ASSOCIATED WITH TOXIC REACTIONS.     Physical Findings: AIMS: Facial and Oral Movements Muscles of Facial Expression: None, normal Lips and Perioral Area: None, normal Jaw: None, normal Tongue: None, normal,Extremity Movements Upper (arms, wrists,  hands, fingers): None, normal Lower (legs, knees, ankles, toes): None, normal, Trunk Movements Neck, shoulders, hips: None, normal, Overall Severity Severity of abnormal movements (highest score from questions above): None, normal Incapacitation due to abnormal movements: None, normal Patient's awareness of abnormal movements (rate only patient's report): No Awareness, Dental Status Current problems with teeth and/or dentures?: No Does patient usually wear dentures?: No  CIWA:  CIWA-Ar Total: 1 COWS:  COWS Total Score: 1   See Psychiatric Specialty Exam and Suicide Risk Assessment completed by Attending Physician prior to discharge.  Discharge destination:  Home  Is patient on multiple antipsychotic therapies at discharge:  No   Has Patient had three or more failed trials of antipsychotic monotherapy by history:  No    Recommended Plan for Multiple Antipsychotic Therapies: NA     Medication List    STOP taking these medications        acetaminophen 500 MG tablet  Commonly known as:  TYLENOL     HYDROcodone-acetaminophen 5-325 MG tablet  Commonly known as:  NORCO/VICODIN     multivitamin with minerals Tabs tablet     polyethylene glycol packet  Commonly known as:  MIRALAX     QUEtiapine 200 MG tablet  Commonly known as:  SEROQUEL     risperiDONE 2 MG tablet  Commonly known as:  RISPERDAL     traZODone 50 MG tablet  Commonly known as:  DESYREL      TAKE these medications      Indication   citalopram 20 MG tablet  Commonly known as:  CELEXA  Take 1 tablet (20 mg total) by mouth daily.   Indication:  Aggressive Behavior, Depression           Follow-up Information    Follow up with [facility] for outpatient placement and for resources.      Follow-up recommendations:  Activity:  as tol, diet as tol  Comments:  1.  Take all your medications as prescribed.              2.  Report any adverse side effects to outpatient provider referrals to [facility] and  [facility].                       3.  Patient instructed to not use alcohol or illegal drugs while on prescription medicines.            4.  In the event of worsening symptoms, instructed patient to call 911, the crisis hotline or go to nearest emergency room for evaluation of symptoms.  Total Discharge Time:  30 min  Signed: Freda Munro May Devaughn Savant AGNP-BC 10/30/2014, 1:16 PM

## 2014-10-30 NOTE — Progress Notes (Signed)
Patient denies any SI/HI/AVH, has had beloongings returned and signed for them, also receiving Prescription and discharge instructions and signing required discharge paperwork. Patient escorted to [place] by staff member, bus ticket provided.

## 2014-10-30 NOTE — BH Assessment (Signed)
[facility] Assessment Progress Note Patient was discharged this date with this writer reviewing follow up care instructions. Patient was referred to the [facility] with this writer contacting the Center to inform them that this individual was in route to their location. Patient stated he had other supports in the community and was encouraged to contact him once he located housing. This Clinical research associate also reviewed his relapse prevention plan and gave him information on outpatient services available at [facility]. This Clinical research associate also contacted [facility] and gave the patient information on what services were offered there. Patient stated he was going to follow up with [facility] or [facility] to explore possible outpatient treatment programs. Patient was provided transportation to the [employer].

## 2014-11-06 ENCOUNTER — Observation Stay (HOSPITAL_COMMUNITY): Payer: Self-pay

## 2014-11-06 ENCOUNTER — Observation Stay (HOSPITAL_COMMUNITY)
Admission: EM | Admit: 2014-11-06 | Discharge: 2014-11-07 | Disposition: A | Discharge status: Home / Self Care | Payer: Self-pay | Attending: Emergency Medicine | Admitting: Emergency Medicine

## 2014-11-06 ENCOUNTER — Encounter (HOSPITAL_COMMUNITY): Payer: Self-pay | Admitting: *Deleted

## 2014-11-06 ENCOUNTER — Emergency Department (HOSPITAL_COMMUNITY): Payer: MEDICAID

## 2014-11-06 ENCOUNTER — Emergency Department (HOSPITAL_COMMUNITY): Payer: Self-pay

## 2014-11-06 DIAGNOSIS — F17210 Nicotine dependence, cigarettes, uncomplicated: Secondary | ICD-10-CM

## 2014-11-06 DIAGNOSIS — F419 Anxiety disorder, unspecified: Secondary | ICD-10-CM | POA: Insufficient documentation

## 2014-11-06 DIAGNOSIS — R0789 Other chest pain: Secondary | ICD-10-CM

## 2014-11-06 DIAGNOSIS — Z720 Tobacco use: Secondary | ICD-10-CM | POA: Insufficient documentation

## 2014-11-06 DIAGNOSIS — G43909 Migraine, unspecified, not intractable, without status migrainosus: Secondary | ICD-10-CM | POA: Insufficient documentation

## 2014-11-06 DIAGNOSIS — F259 Schizoaffective disorder, unspecified: Secondary | ICD-10-CM | POA: Diagnosis present

## 2014-11-06 DIAGNOSIS — E119 Type 2 diabetes mellitus without complications: Secondary | ICD-10-CM | POA: Insufficient documentation

## 2014-11-06 DIAGNOSIS — F209 Schizophrenia, unspecified: Secondary | ICD-10-CM | POA: Insufficient documentation

## 2014-11-06 DIAGNOSIS — F32A Depression, unspecified: Secondary | ICD-10-CM | POA: Diagnosis present

## 2014-11-06 DIAGNOSIS — Z23 Encounter for immunization: Secondary | ICD-10-CM | POA: Insufficient documentation

## 2014-11-06 DIAGNOSIS — I69351 Hemiplegia and hemiparesis following cerebral infarction affecting right dominant side: Secondary | ICD-10-CM

## 2014-11-06 DIAGNOSIS — M1990 Unspecified osteoarthritis, unspecified site: Secondary | ICD-10-CM | POA: Insufficient documentation

## 2014-11-06 DIAGNOSIS — F1420 Cocaine dependence, uncomplicated: Secondary | ICD-10-CM | POA: Diagnosis present

## 2014-11-06 DIAGNOSIS — F329 Major depressive disorder, single episode, unspecified: Secondary | ICD-10-CM | POA: Diagnosis present

## 2014-11-06 DIAGNOSIS — K219 Gastro-esophageal reflux disease without esophagitis: Secondary | ICD-10-CM | POA: Insufficient documentation

## 2014-11-06 DIAGNOSIS — F1024 Alcohol dependence with alcohol-induced mood disorder: Secondary | ICD-10-CM | POA: Diagnosis present

## 2014-11-06 DIAGNOSIS — R079 Chest pain, unspecified: Principal | ICD-10-CM | POA: Insufficient documentation

## 2014-11-06 DIAGNOSIS — R531 Weakness: Secondary | ICD-10-CM | POA: Insufficient documentation

## 2014-11-06 DIAGNOSIS — I10 Essential (primary) hypertension: Secondary | ICD-10-CM | POA: Insufficient documentation

## 2014-11-06 DIAGNOSIS — F319 Bipolar disorder, unspecified: Secondary | ICD-10-CM | POA: Insufficient documentation

## 2014-11-06 DIAGNOSIS — Z79899 Other long term (current) drug therapy: Secondary | ICD-10-CM | POA: Insufficient documentation

## 2014-11-06 DIAGNOSIS — F1020 Alcohol dependence, uncomplicated: Secondary | ICD-10-CM

## 2014-11-06 DIAGNOSIS — R51 Headache: Secondary | ICD-10-CM

## 2014-11-06 DIAGNOSIS — F251 Schizoaffective disorder, depressive type: Secondary | ICD-10-CM

## 2014-11-06 DIAGNOSIS — Z8673 Personal history of transient ischemic attack (TIA), and cerebral infarction without residual deficits: Secondary | ICD-10-CM | POA: Insufficient documentation

## 2014-11-06 DIAGNOSIS — G459 Transient cerebral ischemic attack, unspecified: Secondary | ICD-10-CM | POA: Diagnosis present

## 2014-11-06 DIAGNOSIS — Z8701 Personal history of pneumonia (recurrent): Secondary | ICD-10-CM | POA: Insufficient documentation

## 2014-11-06 HISTORY — DX: Migraine, unspecified, not intractable, without status migrainosus: G43.909

## 2014-11-06 HISTORY — DX: Anxiety disorder, unspecified: F41.9

## 2014-11-06 HISTORY — DX: Headache: R51

## 2014-11-06 HISTORY — DX: Low back pain, unspecified: M54.50

## 2014-11-06 HISTORY — DX: Cerebral infarction, unspecified: I63.9

## 2014-11-06 HISTORY — DX: Pneumonia, unspecified organism: J18.9

## 2014-11-06 HISTORY — DX: Low back pain: M54.5

## 2014-11-06 HISTORY — DX: Other intervertebral disc degeneration, lumbar region without mention of lumbar back pain or lower extremity pain: M51.369

## 2014-11-06 HISTORY — DX: Type 2 diabetes mellitus without complications: E11.9

## 2014-11-06 HISTORY — DX: Other intervertebral disc degeneration, lumbar region: M51.36

## 2014-11-06 HISTORY — DX: Unspecified osteoarthritis, unspecified site: M19.90

## 2014-11-06 HISTORY — DX: Other chronic pain: G89.29

## 2014-11-06 HISTORY — DX: Bipolar disorder, unspecified: F31.9

## 2014-11-06 HISTORY — DX: Headache, unspecified: R51.9

## 2014-11-06 LAB — CBC
HEMATOCRIT: 46.7 % (ref 39.0–52.0)
Hemoglobin: 16.1 g/dL (ref 13.0–17.0)
MCH: 29.2 pg (ref 26.0–34.0)
MCHC: 34.5 g/dL (ref 30.0–36.0)
MCV: 84.6 fL (ref 78.0–100.0)
Platelets: 341 10*3/uL (ref 150–400)
RBC: 5.52 MIL/uL (ref 4.22–5.81)
RDW: 14 % (ref 11.5–15.5)
WBC: 9.9 10*3/uL (ref 4.0–10.5)

## 2014-11-06 LAB — URINALYSIS, ROUTINE W REFLEX MICROSCOPIC
Bilirubin Urine: NEGATIVE
GLUCOSE, UA: NEGATIVE mg/dL
HGB URINE DIPSTICK: NEGATIVE
KETONES UR: 15 mg/dL — AB
Leukocytes, UA: NEGATIVE
Nitrite: NEGATIVE
PROTEIN: NEGATIVE mg/dL
Specific Gravity, Urine: 1.025 (ref 1.005–1.030)
Urobilinogen, UA: 1 mg/dL (ref 0.0–1.0)
pH: 6 (ref 5.0–8.0)

## 2014-11-06 LAB — BASIC METABOLIC PANEL
ANION GAP: 17 — AB (ref 5–15)
BUN: 13 mg/dL (ref 6–20)
CALCIUM: 10.3 mg/dL (ref 8.9–10.3)
CO2: 24 mmol/L (ref 22–32)
CREATININE: 1.32 mg/dL — AB (ref 0.61–1.24)
Chloride: 96 mmol/L — ABNORMAL LOW (ref 101–111)
Glucose, Bld: 90 mg/dL (ref 65–99)
Potassium: 4.2 mmol/L (ref 3.5–5.1)
SODIUM: 137 mmol/L (ref 135–145)

## 2014-11-06 LAB — MAGNESIUM: MAGNESIUM: 1.9 mg/dL (ref 1.7–2.4)

## 2014-11-06 LAB — LIPID PANEL
CHOL/HDL RATIO: 2.8 ratio
Cholesterol: 190 mg/dL (ref 0–200)
HDL: 68 mg/dL (ref >40)
LDL CALC: 116 mg/dL — AB (ref 0–99)
Triglycerides: 31 mg/dL (ref <150)
VLDL: 6 mg/dL (ref 0–40)

## 2014-11-06 LAB — HEPATIC FUNCTION PANEL
ALK PHOS: 46 U/L (ref 38–126)
ALT: 45 U/L (ref 17–63)
AST: 24 U/L (ref 15–41)
Albumin: 3.3 g/dL — ABNORMAL LOW (ref 3.5–5.0)
BILIRUBIN INDIRECT: 0.8 mg/dL (ref 0.3–0.9)
Bilirubin, Direct: 0.2 mg/dL (ref 0.1–0.5)
TOTAL PROTEIN: 5.9 g/dL — AB (ref 6.5–8.1)
Total Bilirubin: 1 mg/dL (ref 0.3–1.2)

## 2014-11-06 LAB — TROPONIN I: Troponin I: <0.03 ng/mL (ref <0.031)

## 2014-11-06 LAB — RAPID URINE DRUG SCREEN, HOSP PERFORMED
AMPHETAMINES: NOT DETECTED
BARBITURATES: NOT DETECTED
Benzodiazepines: NOT DETECTED
Cocaine: POSITIVE — AB
Opiates: NOT DETECTED
TETRAHYDROCANNABINOL: NOT DETECTED

## 2014-11-06 LAB — MRSA PCR SCREENING: MRSA by PCR: NEGATIVE

## 2014-11-06 LAB — I-STAT TROPONIN, ED: Troponin i, poc: 0 ng/mL (ref 0.00–0.08)

## 2014-11-06 LAB — PHOSPHORUS: PHOSPHORUS: 4.4 mg/dL (ref 2.5–4.6)

## 2014-11-06 LAB — LACTIC ACID, PLASMA: Lactic Acid, Venous: 1.2 mmol/L (ref 0.5–2.0)

## 2014-11-06 LAB — TSH: TSH: 1.079 u[IU]/mL (ref 0.350–4.500)

## 2014-11-06 LAB — ETHANOL

## 2014-11-06 MED ORDER — VITAMIN B-1 100 MG PO TABS
100.0000 mg | ORAL_TABLET | Freq: Every day | ORAL | Status: DC
  Administered 2014-11-07: 100 mg via ORAL
  Filled 2014-11-06: qty 1

## 2014-11-06 MED ORDER — FOLIC ACID 1 MG PO TABS
1.0000 mg | ORAL_TABLET | Freq: Every day | ORAL | Status: DC
  Administered 2014-11-06 – 2014-11-07 (×2): 1 mg via ORAL
  Filled 2014-11-06 (×2): qty 1

## 2014-11-06 MED ORDER — PROMETHAZINE HCL 25 MG/ML IJ SOLN
12.5000 mg | Freq: Once | INTRAMUSCULAR | Status: AC
  Administered 2014-11-06: 12.5 mg via INTRAVENOUS
  Filled 2014-11-06: qty 1

## 2014-11-06 MED ORDER — LORAZEPAM 2 MG/ML IJ SOLN
1.0000 mg | Freq: Four times a day (QID) | INTRAMUSCULAR | Status: DC | PRN
  Administered 2014-11-06: 1 mg via INTRAVENOUS
  Filled 2014-11-06: qty 1

## 2014-11-06 MED ORDER — VITAMIN B-1 100 MG PO TABS: 100.0000 mg | ORAL_TABLET | Freq: Once | ORAL | Status: DC

## 2014-11-06 MED ORDER — DICLOFENAC SODIUM 1 % TD GEL
2.0000 g | Freq: Four times a day (QID) | TRANSDERMAL | Status: DC | PRN
  Administered 2014-11-06: 2 g via TOPICAL
  Filled 2014-11-06: qty 100

## 2014-11-06 MED ORDER — SODIUM CHLORIDE 0.9 % IV BOLUS (SEPSIS)
1000.0000 mL | Freq: Once | INTRAVENOUS | Status: AC
  Administered 2014-11-06: 1000 mL via INTRAVENOUS

## 2014-11-06 MED ORDER — SODIUM CHLORIDE 0.9 % IV SOLN: INTRAVENOUS | Status: DC

## 2014-11-06 MED ORDER — IOHEXOL 350 MG/ML SOLN
100.0000 mL | Freq: Once | INTRAVENOUS | Status: AC | PRN
  Administered 2014-11-06: 100 mL via INTRAVENOUS

## 2014-11-06 MED ORDER — ENOXAPARIN SODIUM 40 MG/0.4ML ~~LOC~~ SOLN
40.0000 mg | SUBCUTANEOUS | Status: DC
  Administered 2014-11-06 – 2014-11-07 (×2): 40 mg via SUBCUTANEOUS
  Filled 2014-11-06 (×2): qty 0.4

## 2014-11-06 MED ORDER — ACETAMINOPHEN 325 MG PO TABS
650.0000 mg | ORAL_TABLET | Freq: Four times a day (QID) | ORAL | Status: DC | PRN
  Administered 2014-11-06 – 2014-11-07 (×3): 650 mg via ORAL
  Filled 2014-11-06 (×3): qty 2

## 2014-11-06 MED ORDER — SODIUM CHLORIDE 0.9 % IJ SOLN
3.0000 mL | Freq: Two times a day (BID) | INTRAMUSCULAR | Status: DC
  Administered 2014-11-06: 3 mL via INTRAVENOUS

## 2014-11-06 MED ORDER — LORAZEPAM 1 MG PO TABS: 1.0000 mg | ORAL_TABLET | Freq: Four times a day (QID) | ORAL | Status: DC | PRN

## 2014-11-06 MED ORDER — ATORVASTATIN CALCIUM 40 MG PO TABS
40.0000 mg | ORAL_TABLET | Freq: Every day | ORAL | Status: DC
  Administered 2014-11-06 – 2014-11-07 (×2): 40 mg via ORAL
  Filled 2014-11-06 (×2): qty 1

## 2014-11-06 MED ORDER — CITALOPRAM HYDROBROMIDE 20 MG PO TABS
20.0000 mg | ORAL_TABLET | Freq: Every day | ORAL | Status: DC
  Administered 2014-11-06 – 2014-11-07 (×2): 20 mg via ORAL
  Filled 2014-11-06: qty 2
  Filled 2014-11-06: qty 1

## 2014-11-06 MED ORDER — ACETAMINOPHEN 650 MG RE SUPP: 650.0000 mg | Freq: Four times a day (QID) | RECTAL | Status: DC | PRN

## 2014-11-06 MED ORDER — THIAMINE HCL 100 MG/ML IJ SOLN: 100.0000 mg | Freq: Once | INTRAMUSCULAR | Status: DC

## 2014-11-06 MED ORDER — RISPERIDONE 2 MG PO TABS
2.0000 mg | ORAL_TABLET | Freq: Every day | ORAL | Status: DC
  Administered 2014-11-06: 2 mg via ORAL
  Filled 2014-11-06 (×2): qty 1

## 2014-11-06 MED ORDER — ASPIRIN 81 MG PO CHEW
81.0000 mg | CHEWABLE_TABLET | Freq: Every day | ORAL | Status: DC
  Administered 2014-11-07: 81 mg via ORAL
  Filled 2014-11-06: qty 1

## 2014-11-06 MED ORDER — PANTOPRAZOLE SODIUM 40 MG PO TBEC
40.0000 mg | DELAYED_RELEASE_TABLET | Freq: Every day | ORAL | Status: DC
  Administered 2014-11-06 – 2014-11-07 (×2): 40 mg via ORAL
  Filled 2014-11-06 (×2): qty 1

## 2014-11-06 MED ORDER — INFLUENZA VAC SPLIT QUAD 0.5 ML IM SUSY
0.5000 mL | PREFILLED_SYRINGE | INTRAMUSCULAR | Status: AC
  Administered 2014-11-07: 0.5 mL via INTRAMUSCULAR
  Filled 2014-11-06: qty 0.5

## 2014-11-06 MED ORDER — THIAMINE HCL 100 MG/ML IJ SOLN
100.0000 mg | Freq: Once | INTRAMUSCULAR | Status: AC
  Administered 2014-11-06: 100 mg via INTRAVENOUS
  Filled 2014-11-06: qty 2

## 2014-11-06 MED ORDER — DIPHENHYDRAMINE HCL 50 MG/ML IJ SOLN
25.0000 mg | Freq: Once | INTRAMUSCULAR | Status: AC
  Administered 2014-11-06: 25 mg via INTRAVENOUS
  Filled 2014-11-06: qty 1

## 2014-11-06 MED ORDER — DEXTROSE-NACL 5-0.9 % IV SOLN
INTRAVENOUS | Status: AC
  Administered 2014-11-06: 11:00:00 via INTRAVENOUS

## 2014-11-06 MED ORDER — ADULT MULTIVITAMIN W/MINERALS CH
1.0000 | ORAL_TABLET | Freq: Every day | ORAL | Status: DC
  Administered 2014-11-06 – 2014-11-07 (×2): 1 via ORAL
  Filled 2014-11-06 (×2): qty 1

## 2014-11-06 MED ORDER — SODIUM CHLORIDE 0.9 % IV SOLN
Freq: Once | INTRAVENOUS | Status: AC
  Administered 2014-11-06: 06:00:00 via INTRAVENOUS

## 2014-11-06 MED ORDER — KETOROLAC TROMETHAMINE 30 MG/ML IJ SOLN
30.0000 mg | Freq: Once | INTRAMUSCULAR | Status: AC
  Administered 2014-11-06: 30 mg via INTRAVENOUS
  Filled 2014-11-06: qty 1

## 2014-11-06 MED ORDER — SENNOSIDES-DOCUSATE SODIUM 8.6-50 MG PO TABS: 1.0000 | ORAL_TABLET | Freq: Every evening | ORAL | Status: DC | PRN

## 2014-11-06 MED ORDER — NITROGLYCERIN 0.4 MG SL SUBL
0.4000 mg | SUBLINGUAL_TABLET | SUBLINGUAL | Status: DC | PRN
Start: 2014-11-06 — End: 2014-11-07

## 2014-11-06 NOTE — ED Provider Notes (Signed)
CSN: [id number]     Arrival date & time 11/06/14  0539 History   First MD Initiated Contact with Patient 11/06/14 [phone]     Chief Complaint  Patient presents with  . Chest Pain     (Consider location/radiation/quality/duration/timing/severity/associated sxs/prior Treatment) HPI   Lonnie [name] is a 49 y/o male with PMH of cocaine abuse, alcohol dependence, previous TIA, and MDD who presents today with back and chest pain. He states that at about 4 this morning he noticed sudden onset stabbing pain in the left shoulder that radiated to the chest. Since the pain started it has improved slightly. He denies alleviating or aggravating factors. He also reports some associated blurred vision, headache, dizziness, and palpitations. Patient reports some right sided weakness but states first that it began acutely and later that it has been present for about a year. He states that his current symptoms are similar to the TIA he had in the past. Denies diaphoresis, abdominal pain, SOB, cough, or fever. Last cocaine use was Tuesday.  Past Medical History  Diagnosis Date  . GERD (gastroesophageal reflux disease)   . Depression   . Lumbago   . ETOH abuse   . Cocaine abuse   . Xanax use disorder, mild, abuse   . Marijuana abuse   . Schizophrenia (HCC)   . Hypertension     pt reports that he has hx   Past Surgical History  Procedure Laterality Date  . Back surgery    . Neck surgery    . Esophagogastroduodenoscopy (egd) with propofol N/A 07/30/2013    Procedure: ESOPHAGOGASTRODUODENOSCOPY (EGD) WITH PROPOFOL;  Surgeon: Barrie Folk, MD;  Location: [facility];  Service: Endoscopy;  Laterality: N/A;  . Hernia repair Right 08/23/2014     at [facility]    Family History  Problem Relation Age of Onset  . Diabetes Mother   . Hypertension Mother   . Hyperlipidemia Father   . Heart attack Neg Hx   . Sudden death Neg Hx    Social History  Substance Use Topics  . Smoking status: Current Some  Day Smoker -- 0.00 packs/day for 0 years    Types: Cigarettes  . Smokeless tobacco: Never Used  . Alcohol Use: Yes     Comment: 3-4 40 oz per day    Review of Systems  Ten systems reviewed and are negative for acute change, except as noted in the HPI.    Allergies  Morphine and related  Home Medications   Prior to Admission medications   Medication Sig Start Date End Date Taking? Authorizing Provider  citalopram (CELEXA) 20 MG tablet Take 1 tablet (20 mg total) by mouth daily. 10/30/14  Yes Adonis Brook, NP  risperiDONE (RISPERDAL) 2 MG tablet Take 2 mg by mouth at bedtime.   Yes Historical Provider, MD   BP 121/77 mmHg  Pulse 84  Temp(Src) 97.4 F (36.3 C) (Oral)  Resp 20  Ht 5\' 9"  (1.753 m)  Wt 180 lb (81.647 kg)  BMI 26.57 kg/m2  SpO2 98% Physical Exam  Constitutional: He is oriented to person, place, and time. He appears well-developed and well-nourished. No distress.  HENT:  Head: Normocephalic and atraumatic.  Eyes: Conjunctivae and EOM are normal. Pupils are equal, round, and reactive to light. No scleral icterus.  Neck: Normal range of motion. Neck supple.  Cardiovascular: Normal rate, regular rhythm, normal heart sounds and intact distal pulses.   Pulses equal and present in all extremeties.  Pulmonary/Chest: Effort normal  and breath sounds normal. No respiratory distress. He exhibits no tenderness.  Abdominal: Soft. He exhibits no distension. There is no tenderness. There is no rebound and no guarding.  Musculoskeletal: He exhibits no edema or tenderness.  Neurological: He is alert and oriented to person, place, and time. No cranial nerve deficit.  Speech is clear and goal oriented, follows commands Major Cranial nerves without deficit, no facial droop Noticeable weakness on the right compared to left Sensation normal to light and sharp touch Moves extremities without ataxia, coordination intact Normal finger to nose and rapid alternating movements Neg  romberg, no pronator drift Normal gait Normal heel-shin and balance   Skin: Skin is warm and dry. He is not diaphoretic.  Psychiatric: His behavior is normal.  Nursing note and vitals reviewed.   ED Course  Procedures (including critical care time) Labs Review Labs Reviewed  BASIC METABOLIC PANEL - Abnormal; Notable for the following:    Chloride 96 (*)    Creatinine, Ser 1.32 (*)    Anion gap 17 (*)    All other components within normal limits  URINE RAPID DRUG SCREEN, HOSP PERFORMED - Abnormal; Notable for the following:    Cocaine POSITIVE (*)    All other components within normal limits  CBC  ETHANOL  URINALYSIS, ROUTINE W REFLEX MICROSCOPIC (NOT AT [facility])  HEMOGLOBIN A1C  TSH  I-STAT TROPOININ, ED    Imaging Review Dg Chest 2 View  11/06/2014  CLINICAL DATA:  Chest pain today. Headaches. History of hypertension. EXAM: CHEST  2 VIEW COMPARISON:  12/24/2013 FINDINGS: The heart size and mediastinal contours are within normal limits. Both lungs are clear. The visualized skeletal structures are unremarkable. IMPRESSION: No active cardiopulmonary disease. Electronically Signed   By: Burman Nieves M.D.   On: 11/06/2014 06:44   Ct Head Wo Contrast  11/06/2014  CLINICAL DATA:  Frontal headache with numbness of the right face. 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 This weakness is worse today. EXAM: CT HEAD WITHOUT CONTRAST TECHNIQUE: Contiguous axial images were obtained from the base of the skull through the vertex without intravenous contrast. COMPARISON:  08/09/2013 FINDINGS: The brainstem, cerebellum, cerebral peduncles, thalamus, basal ganglia, basilar cisterns, and ventricular system appear within normal limits. No intracranial hemorrhage, mass lesion, or acute CVA. On the prior MRI from 08/09/2013, the basilar artery was noted to demonstrate focal in diffuse narrowing; on today's exam the basilar artery is not well visualized. Chronic ethmoid mucosal  thickening with minimal chronic sphenoid mucosal thickening. IMPRESSION: 1. No acute intracranial findings are identified. 2. Chronic ethmoid and sphenoid sinusitis. Electronically Signed   By: Gaylyn Rong M.D.   On: 11/06/2014 07:42   Ct Angio Chest Aorta W/cm &/or Wo/cm  11/06/2014  CLINICAL DATA:  Left upper chest pain radiating to the left upper back. Right-sided weakness. History of stroke. EXAM: CT ANGIOGRAPHY CHEST, ABDOMEN AND PELVIS TECHNIQUE: Multidetector CT imaging through the chest, abdomen and pelvis was performed using the standard protocol during bolus administration of intravenous contrast. Multiplanar reconstructed images and MIPs were obtained and reviewed to evaluate the vascular anatomy. CONTRAST:  OMNIPAQUE IOHEXOL 350 MG/ML SOLN COMPARISON:  Multiple exams, including 11/06/2014 chest radiograph and abdominal CT from 08/01/2014 FINDINGS: CTA CHEST FINDINGS Mediastinum/Nodes: The noncontrast images do not demonstrate findings characteristic of acute intramural hematoma. Systemic arterial phase images demonstrate minimal atherosclerotic calcification of the aortic arch. There is evidence of atherosclerotic calcification in the left anterior descending coronary artery. No thoracic aortic dissection. Today's  exam was not optimized for opacification of the pulmonary arteries, but without mild reduction in sensitivity in mind, no filling defect is visible in the pulmonary arterial tree to suggest pulmonary embolus. Trace pericardial effusion observed anteriorly, increased from 08/01/2014. No overt pathologic adenopathy or specific esophageal abnormality observed. Lungs/Pleura: 1.9 by 0.7 by 0.8 cm filling defect in the right mainstem bronchus shown on image 21 series 3, along with some mild central airway thickening. Musculoskeletal: Unremarkable Review of the MIP images confirms the above findings. CTA ABDOMEN AND PELVIS FINDINGS Hepatobiliary: No abnormal arterial phase findings.  Pancreas: Unremarkable Spleen: Unremarkable Adrenals/Urinary Tract: Abnormal wall thickening of the urinary bladder diffusely. Stomach/Bowel: Appendix normal. Questionable wall thickening in the rectum, which was not present on 08/01/2014. Vascular/Lymphatic: Widely patent celiac trunk, superior mesenteric artery, and inferior mesenteric artery with widely patent single bilateral renal arteries. Normal branch pattern of the celiac trunk. No abdominal aortic dissection. There is some mild infrarenal abdominal aortic atherosclerotic calcification which also tracks into the common and external iliac arteries. No critical stenosis. No pathologic adenopathy identified. Reproductive: There stranding along the right groin region likely from a hernia repair. There is also some fascia plane thickening along the right spermatic cord which also might relate to the hernia repair. Other: No supplemental non-categorized findings. Musculoskeletal: Bridging spurring of the right sacroiliac joint. Spurring of both acetabula. 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 There is evidence of degenerative disc disease in the lower lumbar spine with potential foraminal impingement especially at L3-4 and especially on the left. Review of the MIP images confirms the above findings. IMPRESSION: 1. No dissection. No findings of pulmonary embolus or other acute vascular abnormality. There is some mild atherosclerotic calcification in the left anterior descending coronary artery, in the aortic arch, and in the infrarenal abdominal aorta and iliac tree. No critical stenosis identified in the larger vessels. 2. Trace pericardial effusion anteriorly, but increased from 08/01/2014. 3. A filling defect of the right mainstem bronchus is most likely due to mucus, although not 100% specific. 4. Airway thickening is present, suggesting bronchitis or reactive airways disease. 5. Abnormal wall thickening in the  urinary bladder diffusely. Cystitis is not excluded. 6. Right groin hernia repair postoperative findings. Thickening of fascia planes along the right spermatic cord probably related to the hernia repair. 7. Degenerative disc disease especially at L3-4 potentially causing foraminal stenosis on the left. 8. Bridging spurring of the right sacroiliac joint. 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 9. Questionable wall thickening in the rectum, probably incidental given the normal appearance in this region on 08/01/2014. Electronically Signed   By: Gaylyn RongWalter  Liebkemann M.D.   On: 11/06/2014 08:16   Ct Cta Abd/pel W/cm &/or W/o Cm  11/06/2014  CLINICAL DATA:  Left upper chest pain radiating to the left upper back. Right-sided weakness. History of stroke. EXAM: CT ANGIOGRAPHY CHEST, ABDOMEN AND PELVIS TECHNIQUE: Multidetector CT imaging through the chest, abdomen and pelvis was performed using the standard protocol during bolus administration of intravenous contrast. Multiplanar reconstructed images and MIPs were obtained and reviewed to evaluate the vascular anatomy. CONTRAST:  100mL OMNIPAQUE IOHEXOL 350 MG/ML SOLN COMPARISON:  Multiple exams, including 11/06/2014 chest radiograph and abdominal CT from 08/01/2014 FINDINGS: CTA CHEST FINDINGS Mediastinum/Nodes: The noncontrast images do not demonstrate findings characteristic of acute intramural hematoma. Systemic arterial phase images demonstrate minimal atherosclerotic calcification of the aortic arch. There is evidence of atherosclerotic calcification in the left  anterior descending coronary artery. No thoracic aortic dissection. Today's exam was not optimized for opacification of the pulmonary arteries, but without mild reduction in sensitivity in mind, no filling defect is visible in the pulmonary arterial tree to suggest pulmonary embolus. Trace pericardial effusion observed anteriorly,  increased from 08/01/2014. No overt pathologic adenopathy or specific esophageal abnormality observed. Lungs/Pleura: 1.9 by 0.7 by 0.8 cm filling defect in the right mainstem bronchus shown on image 21 series 3, along with some mild central airway thickening. Musculoskeletal: Unremarkable Review of the MIP images confirms the above findings. CTA ABDOMEN AND PELVIS FINDINGS Hepatobiliary: No abnormal arterial phase findings. Pancreas: Unremarkable Spleen: Unremarkable Adrenals/Urinary Tract: Abnormal wall thickening of the urinary bladder diffusely. Stomach/Bowel: Appendix normal. Questionable wall thickening in the rectum, which was not present on 08/01/2014. Vascular/Lymphatic: Widely patent celiac trunk, superior mesenteric artery, and inferior mesenteric artery with widely patent single bilateral renal arteries. Normal branch pattern of the celiac trunk. No abdominal aortic dissection. There is some mild infrarenal abdominal aortic atherosclerotic calcification which also tracks into the common and external iliac arteries. No critical stenosis. No pathologic adenopathy identified. Reproductive: There stranding along the right groin region likely from a hernia repair. There is also some fascia plane thickening along the right spermatic cord which also might relate to the hernia repair. Other: No supplemental non-categorized findings. Musculoskeletal: Bridging spurring of the right sacroiliac joint. Spurring of both acetabula. 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 There is evidence of degenerative disc disease in the lower lumbar spine with potential foraminal impingement especially at L3-4 and especially on the left. Review of the MIP images confirms the above findings. IMPRESSION: 1. No dissection. No findings of pulmonary embolus or other acute vascular abnormality. There is some mild atherosclerotic calcification in the left anterior descending coronary artery, in the  aortic arch, and in the infrarenal abdominal aorta and iliac tree. No critical stenosis identified in the larger vessels. 2. Trace pericardial effusion anteriorly, but increased from 08/01/2014. 3. A filling defect of the right mainstem bronchus is most likely due to mucus, although not 100% specific. 4. Airway thickening is present, suggesting bronchitis or reactive airways disease. 5. Abnormal wall thickening in the urinary bladder diffusely. Cystitis is not excluded. 6. Right groin hernia repair postoperative findings. Thickening of fascia planes along the right spermatic cord probably related to the hernia repair. 7. Degenerative disc disease especially at L3-4 potentially causing foraminal stenosis on the left. 8. Bridging spurring of the right sacroiliac joint. 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 9. Questionable wall thickening in the rectum, probably incidental given the normal appearance in this region on 08/01/2014. Electronically Signed   By: Gaylyn Rong M.D.   On: 11/06/2014 08:16   I have personally reviewed and evaluated these images and lab results as part of my medical decision-making.   EKG Interpretation   Date/Time:  Wednesday November 06 2014 07:14:19 EDT Ventricular Rate:  89 PR Interval:  142 QRS Duration: 76 QT Interval:  374 QTC Calculation: 455 R Axis:   78 Text Interpretation:  Sinus rhythm Nonspecific T abnormalities, lateral  leads ST elev, probable normal early repol pattern Partial missing  lead(s): V2 V3 V4 V5 V6 No significant change was found Confirmed by  Manus Gunning  MD, STEPHEN ([id number]) on 11/06/2014 7:27:43 AM      MDM   Final diagnoses:  Weakness  Chest pain, unspecified chest pain type    10:09  AM. BP 121/77 mmHg  Pulse 84  Temp(Src) 97.4 F (36.3 C) (Oral)  Resp 20  Ht  (1.753 m)  Wt 180 lb (81.647 kg)  BMI 26.57 kg/m2  SpO2 98% Patient with concerning sxs for  tia/stroke/aortic dissection. No Changes in EKG, negative troponin.    10:09 AM The patient's urine is cocaine positive. Creatinine, appears at baseline, he has a normal GFR. He does appear to have an anion gap. Unsure of the etiology of this. His blood alcohol level is negative. Imaging shows no acute abnormalities. There is some concern for bladder wall thickening and will add on a urinalysis. I spoken with residents who will admit the patient for MRI to rule out stroke, cycled troponins given cocaine positive urine.   Arthor Captain, PA-C 11/06/14 1009  Glynn Octave, MD 11/06/14 [id number]

## 2014-11-06 NOTE — H&P (Signed)
Date: 11/06/2014               Patient Name:  Lonnie [name] MRN: [id number]  DOB: 29-Jan-1965 Age / Sex: 49 y.o., male   PCP: No Pcp Per Patient         Medical Service: Internal Medicine Teaching Service         Attending Physician: Dr. Inez Catalina, MD    First Contact: Dr. Karma Greaser  Pager: [phone]  Second Contact: Dr. Senaida Ores  Pager: [phone]       After Hours (After 5p/  First Contact Pager: [phone]  weekends / holidays): Second Contact Pager: [phone]   Chief Complaint: chest pain   History of Present Illness:   Lonnie [name] is 49 year old man with past medical history of hypertension, hyperlipidemia, polysubstance abuse, depression, lumbar DDD, right inguinal hernia s/p repair, schizophrenia, TIA/CVA, and GERD who presents with chief complaint of chest pain.   He reports acute onset of left sided upper back pain with radiation to the right side of his chest this morning at 3-4 AM while smoking cigarettes in a [employer] parking lot. He describes the pain as 10/10 sharp, stabbing, and constant with associated palpitations, nausea, lightheadedness, hypersalivation, headache, and right facial twitching. He reports having chest pain many years ago but not as severe as this episode. He has not had prior heart catherization or stress testing to his knowledge. His echo in 2015 revealed normal EF with grade 1 diastolic dysfunction. He has chronic DOE after 1-2 blocks and occasional LE edema but denies dyspnea, orthopnea, weight gain, or PND. He has family history of MI in his living mother (age 98). He denies heavy lifting and reports mild tenderness to palpation of his left chest. His chest pain improved significantly with aspirin and SL nitroglycerin administration by EMS. He smokes cocaine weekly with last use two days ago. His UDS on admission was positive for cocaine. He drinks approximately 24 oz beer daily (drinking for more than 10 years) with last drink 1-2 days ago. He has  history of GERD and is not currently on any medications. He denies acid reflux symptoms. He denies history of PE.   He reports having a CVA/TIA last July with residual right sided weakness and paresthesias. 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 He reports intermittent blurry vision since three days ago. He recently restarted taking aspirin 325 mg daily since the vision changes started. He is not taking lipitor. He denies new weakness, paraesthesias, slurred speech, dysphagia, or facial droop.    Meds: Current Facility-Administered Medications  Medication Dose Route Frequency Provider Last Rate Last Dose  . acetaminophen (TYLENOL) tablet 650 mg  650 mg Oral Q6H PRN Otis Brace, MD       Or  . acetaminophen (TYLENOL) suppository 650 mg  650 mg Rectal Q6H PRN Otis Brace, MD      . Melene Muller ON 11/07/2014] aspirin chewable tablet 81 mg  81 mg Oral Daily Raheim Beutler, MD      . citalopram (CELEXA) tablet 20 mg  20 mg Oral Daily Dandre Sisler, MD   20 mg at 11/06/14 1031  . dextrose 5 %-0.9 % sodium chloride infusion   Intravenous Continuous Semiyah Newgent, MD 100 mL/hr at 11/06/14 1038    . enoxaparin (LOVENOX) injection 40 mg  40 mg Subcutaneous Q24H Otis Brace, MD      .  folic acid (FOLVITE) tablet 1 mg  1 mg Oral Daily Lizzie An, MD   1 mg at 11/06/14 1032  . LORazepam (ATIVAN) tablet 1 mg  1 mg Oral Q6H PRN Otis Brace, MD       Or  . LORazepam (ATIVAN) injection 1 mg  1 mg Intravenous Q6H PRN Otis Brace, MD      . multivitamin with minerals tablet 1 tablet  1 tablet Oral Daily Igor Bishop, MD   1 tablet at 11/06/14 1032  . nitroGLYCERIN (NITROSTAT) SL tablet 0.4 mg  0.4 mg Sublingual Q5 min PRN Jenin Birdsall, MD      . risperiDONE (RISPERDAL) tablet 2 mg  2 mg Oral QHS Lexy Meininger, MD      . senna-docusate (Senokot-S) tablet 1  tablet  1 tablet Oral QHS PRN Danity Schmelzer, MD      . sodium chloride 0.9 % injection 3 mL  3 mL Intravenous Q12H Ekaterina Denise, MD   3 mL at 11/06/14 1040  . [START ON 11/07/2014] thiamine (VITAMIN B-1) tablet 100 mg  100 mg Oral Daily Otis Brace, MD       Current Outpatient Prescriptions  Medication Sig Dispense Refill  . citalopram (CELEXA) 20 MG tablet Take 1 tablet (20 mg total) by mouth daily. 30 tablet 0  . risperiDONE (RISPERDAL) 2 MG tablet Take 2 mg by mouth at bedtime.      Allergies: Allergies as of 11/06/2014 - Review Complete 11/06/2014  Allergen Reaction Noted  . Morphine and related Nausea And Vomiting 11/20/2013   Past Medical History  Diagnosis Date  . GERD (gastroesophageal reflux disease)   . Depression   . Lumbago   . ETOH abuse   . Cocaine abuse   . Xanax use disorder, mild, abuse   . Marijuana abuse   . Schizophrenia (HCC)   . Hypertension     pt reports that he has hx   Past Surgical History  Procedure Laterality Date  . Back surgery    . Neck surgery    . Esophagogastroduodenoscopy (egd) with propofol N/A 07/30/2013    Procedure: ESOPHAGOGASTRODUODENOSCOPY (EGD) WITH PROPOFOL;  Surgeon: Barrie Folk, MD;  Location: [facility];  Service: Endoscopy;  Laterality: N/A;  . Hernia repair Right 08/23/2014     at [facility]    Family History  Problem Relation Age of Onset  . Diabetes Mother   . Hypertension Mother   . Hyperlipidemia Father   . Heart attack Neg Hx   . Sudden death Neg Hx    Social History   Social History  . Marital Status: Single    Spouse Name: N/A  . Number of Children: N/A  . Years of Education: N/A   Occupational History  . Not on file.   Social History Main Topics  . Smoking status: Current Some Day Smoker -- 0.00 packs/day for 0 years    Types: Cigarettes  . Smokeless tobacco: Never Used  . Alcohol Use: Yes     Comment: 3-4 40 oz per day  . Drug Use: Yes    Special: Cocaine     Comment: crack use    . Sexual Activity: Not on file     Comment: sober x 7 months   Other Topics Concern  . Not on file   Social History Narrative   Pt reports that he lives with his partner and their kids near [place] until recently moving to the [facility] house a week ago for residential  substance abuse treatment. He reports a history of smoking cigarettes and significant alcohol consumption but has stopped [facility]. He has reports being clean from cocaine for the last 23 days. He has been unemployed for the past 3 years.     Review of Systems: Pertinent items are noted in HPI.  Physical Exam: Blood pressure 126/77, pulse 82, temperature 97.8 F (36.6 C), temperature source Oral, resp. rate 15, height 5\' 9"  (1.753 m), weight 180 lb (81.647 kg), SpO2 98 %.  General: NAD, sitting up eating lunch  HEENT: No oral ulcers  Neck: Supple, no JVD Heart: Normal rate and rhythm with no murmur. Left sided chest wall tenderness to palpation.   Lungs: Clear to auscultation bilaterally with no wheezing, rales, or ronchi Abdomen: Soft, non-tender, non-distended, normal BS Neuro: A & O x 3, no dysarthria, decreased sensation to light touch of right face, UE, and LE. 4/5 right sided UE and LE strength, otherwise 5/5 throughout. Normal FTN. No pronator drift. Gait not assessed.  Extremities: No edema Skin: No rash  Lab results: Basic Metabolic Panel:  Recent Labs  16/10/9608/12/16 0604 11/06/14 1101  NA 137  --   K 4.2  --   CL 96*  --   CO2 24  --   GLUCOSE 90  --   BUN 13  --   CREATININE 1.32*  --   CALCIUM 10.3  --   MG  --  1.9  PHOS  --  4.4   Liver Function Tests:  Recent Labs  11/06/14 1101  AST 24  ALT 45  ALKPHOS 46  BILITOT 1.0  PROT 5.9*  ALBUMIN 3.3*   CBC:  Recent Labs  11/06/14 0604  WBC 9.9  HGB 16.1  HCT 46.7  MCV 84.6  PLT 341   Cardiac Enzymes:  Recent Labs  11/06/14 1101  TROPONINI <0.03   Fasting Lipid Panel:  Recent Labs  11/06/14 1101   CHOL 190  HDL 68  LDLCALC 116*  TRIG 31  CHOLHDL 2.8   Thyroid Function Tests:  Recent Labs  11/06/14 1102  TSH 1.079   Urine Drug Screen: Drugs of Abuse     Component Value Date/Time   LABOPIA NONE DETECTED 11/06/2014 0719   COCAINSCRNUR POSITIVE* 11/06/2014 0719   LABBENZ NONE DETECTED 11/06/2014 0719   AMPHETMU NONE DETECTED 11/06/2014 0719   THCU NONE DETECTED 11/06/2014 0719   LABBARB NONE DETECTED 11/06/2014 0719    Alcohol Level:  Recent Labs  11/06/14 0620  ETH <5   Urinalysis:  Recent Labs  11/06/14 0719  COLORURINE YELLOW  LABSPEC 1.025  PHURINE 6.0  GLUCOSEU NEGATIVE  HGBUR NEGATIVE  BILIRUBINUR NEGATIVE  KETONESUR 15*  PROTEINUR NEGATIVE  UROBILINOGEN 1.0  NITRITE NEGATIVE  LEUKOCYTESUR NEGATIVE    Imaging results:  Dg Chest 2 View  11/06/2014  CLINICAL DATA:  Chest pain today. Headaches. History of hypertension. EXAM: CHEST  2 VIEW COMPARISON:  12/24/2013 FINDINGS: The heart size and mediastinal contours are within normal limits. Both lungs are clear. The visualized skeletal structures are unremarkable. IMPRESSION: No active cardiopulmonary disease. Electronically Signed   By: Burman NievesWilliam  Stevens M.D.   On: 11/06/2014 06:44   Ct Head Wo Contrast  11/06/2014  CLINICAL DATA:  Frontal headache with numbness of the right face. 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 This weakness is worse today. EXAM: CT HEAD WITHOUT CONTRAST TECHNIQUE: Contiguous axial images were obtained from the base of the skull through the vertex without  intravenous contrast. COMPARISON:  08/09/2013 FINDINGS: The brainstem, cerebellum, cerebral peduncles, thalamus, basal ganglia, basilar cisterns, and ventricular system appear within normal limits. No intracranial hemorrhage, mass lesion, or acute CVA. On the prior MRI from 08/09/2013, the basilar artery was noted to demonstrate focal in diffuse narrowing; on today's exam the basilar artery is not well visualized.  Chronic ethmoid mucosal thickening with minimal chronic sphenoid mucosal thickening. IMPRESSION: 1. No acute intracranial findings are identified. 2. Chronic ethmoid and sphenoid sinusitis. Electronically Signed   By: Gaylyn Rong M.D.   On: 11/06/2014 07:42   Ct Angio Chest Aorta W/cm &/or Wo/cm  11/06/2014  CLINICAL DATA:  Left upper chest pain radiating to the left upper back. Right-sided weakness. History of stroke. EXAM: CT ANGIOGRAPHY CHEST, ABDOMEN AND PELVIS TECHNIQUE: Multidetector CT imaging through the chest, abdomen and pelvis was performed using the standard protocol during bolus administration of intravenous contrast. Multiplanar reconstructed images and MIPs were obtained and reviewed to evaluate the vascular anatomy. CONTRAST:  OMNIPAQUE IOHEXOL 350 MG/ML SOLN COMPARISON:  Multiple exams, including 11/06/2014 chest radiograph and abdominal CT from 08/01/2014 FINDINGS: CTA CHEST FINDINGS Mediastinum/Nodes: The noncontrast images do not demonstrate findings characteristic of acute intramural hematoma. Systemic arterial phase images demonstrate minimal atherosclerotic calcification of the aortic arch. There is evidence of atherosclerotic calcification in the left anterior descending coronary artery. No thoracic aortic dissection. Today's exam was not optimized for opacification of the pulmonary arteries, but without mild reduction in sensitivity in mind, no filling defect is visible in the pulmonary arterial tree to suggest pulmonary embolus. Trace pericardial effusion observed anteriorly, increased from 08/01/2014. No overt pathologic adenopathy or specific esophageal abnormality observed. Lungs/Pleura: 1.9 by 0.7 by 0.8 cm filling defect in the right mainstem bronchus shown on image 21 series 3, along with some mild central airway thickening. Musculoskeletal: Unremarkable Review of the MIP images confirms the above findings. CTA ABDOMEN AND PELVIS FINDINGS Hepatobiliary: No abnormal  arterial phase findings. Pancreas: Unremarkable Spleen: Unremarkable Adrenals/Urinary Tract: Abnormal wall thickening of the urinary bladder diffusely. Stomach/Bowel: Appendix normal. Questionable wall thickening in the rectum, which was not present on 08/01/2014. Vascular/Lymphatic: Widely patent celiac trunk, superior mesenteric artery, and inferior mesenteric artery with widely patent single bilateral renal arteries. Normal branch pattern of the celiac trunk. No abdominal aortic dissection. There is some mild infrarenal abdominal aortic atherosclerotic calcification which also tracks into the common and external iliac arteries. No critical stenosis. No pathologic adenopathy identified. Reproductive: There stranding along the right groin region likely from a hernia repair. There is also some fascia plane thickening along the right spermatic cord which also might relate to the hernia repair. Other: No supplemental non-categorized findings. Musculoskeletal: Bridging spurring of the right sacroiliac joint. Spurring of both acetabula. 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 There is evidence of degenerative disc disease in the lower lumbar spine with potential foraminal impingement especially at L3-4 and especially on the left. Review of the MIP images confirms the above findings. IMPRESSION: 1. No dissection. No findings of pulmonary embolus or other acute vascular abnormality. There is some mild atherosclerotic calcification in the left anterior descending coronary artery, in the aortic arch, and in the infrarenal abdominal aorta and iliac tree. No critical stenosis identified in the larger vessels. 2. Trace pericardial effusion anteriorly, but increased from 08/01/2014. 3. A filling defect of the right mainstem bronchus is most likely due to mucus, although not 100% specific. 4. Airway thickening is present, suggesting bronchitis or reactive  airways disease. 5. Abnormal wall  thickening in the urinary bladder diffusely. Cystitis is not excluded. 6. Right groin hernia repair postoperative findings. Thickening of fascia planes along the right spermatic cord probably related to the hernia repair. 7. Degenerative disc disease especially at L3-4 potentially causing foraminal stenosis on the left. 8. Bridging spurring of the right sacroiliac joint. 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 9. Questionable wall thickening in the rectum, probably incidental given the normal appearance in this region on 08/01/2014. Electronically Signed   By: Gaylyn Rong M.D.   On: 11/06/2014 08:16   Ct Cta Abd/pel W/cm &/or W/o Cm  11/06/2014  CLINICAL DATA:  Left upper chest pain radiating to the left upper back. Right-sided weakness. History of stroke. EXAM: CT ANGIOGRAPHY CHEST, ABDOMEN AND PELVIS TECHNIQUE: Multidetector CT imaging through the chest, abdomen and pelvis was performed using the standard protocol during bolus administration of intravenous contrast. Multiplanar reconstructed images and MIPs were obtained and reviewed to evaluate the vascular anatomy. CONTRAST:  OMNIPAQUE IOHEXOL 350 MG/ML SOLN COMPARISON:  Multiple exams, including 11/06/2014 chest radiograph and abdominal CT from 08/01/2014 FINDINGS: CTA CHEST FINDINGS Mediastinum/Nodes: The noncontrast images do not demonstrate findings characteristic of acute intramural hematoma. Systemic arterial phase images demonstrate minimal atherosclerotic calcification of the aortic arch. There is evidence of atherosclerotic calcification in the left anterior descending coronary artery. No thoracic aortic dissection. Today's exam was not optimized for opacification of the pulmonary arteries, but without mild reduction in sensitivity in mind, no filling defect is visible in the pulmonary arterial tree to suggest pulmonary embolus. Trace pericardial effusion observed  anteriorly, increased from 08/01/2014. No overt pathologic adenopathy or specific esophageal abnormality observed. Lungs/Pleura: 1.9 by 0.7 by 0.8 cm filling defect in the right mainstem bronchus shown on image 21 series 3, along with some mild central airway thickening. Musculoskeletal: Unremarkable Review of the MIP images confirms the above findings. CTA ABDOMEN AND PELVIS FINDINGS Hepatobiliary: No abnormal arterial phase findings. Pancreas: Unremarkable Spleen: Unremarkable Adrenals/Urinary Tract: Abnormal wall thickening of the urinary bladder diffusely. Stomach/Bowel: Appendix normal. Questionable wall thickening in the rectum, which was not present on 08/01/2014. Vascular/Lymphatic: Widely patent celiac trunk, superior mesenteric artery, and inferior mesenteric artery with widely patent single bilateral renal arteries. Normal branch pattern of the celiac trunk. No abdominal aortic dissection. There is some mild infrarenal abdominal aortic atherosclerotic calcification which also tracks into the common and external iliac arteries. No critical stenosis. No pathologic adenopathy identified. Reproductive: There stranding along the right groin region likely from a hernia repair. There is also some fascia plane thickening along the right spermatic cord which also might relate to the hernia repair. Other: No supplemental non-categorized findings. Musculoskeletal: Bridging spurring of the right sacroiliac joint. Spurring of both acetabula. 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 There is evidence of degenerative disc disease in the lower lumbar spine with potential foraminal impingement especially at L3-4 and especially on the left. Review of the MIP images confirms the above findings. IMPRESSION: 1. No dissection. No findings of pulmonary embolus or other acute vascular abnormality. There is some mild atherosclerotic calcification in the left anterior descending coronary  artery, in the aortic arch, and in the infrarenal abdominal aorta and iliac tree. No critical stenosis identified in the larger vessels. 2. Trace pericardial effusion anteriorly, but increased from 08/01/2014. 3. A filling defect of the right mainstem bronchus is most likely due to mucus, although not 100%  specific. 4. Airway thickening is present, suggesting bronchitis or reactive airways disease. 5. Abnormal wall thickening in the urinary bladder diffusely. Cystitis is not excluded. 6. Right groin hernia repair postoperative findings. Thickening of fascia planes along the right spermatic cord probably related to the hernia repair. 7. Degenerative disc disease especially at L3-4 potentially causing foraminal stenosis on the left. 8. Bridging spurring of the right sacroiliac joint. 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 9. Questionable wall thickening in the rectum, probably incidental given the normal appearance in this region on 08/01/2014. Electronically Signed   By: Gaylyn Rong M.D.   On: 11/06/2014 08:16    Other results: EKG: Date/Time: Wednesday November 06 2014 07:14:19 EDT Ventricular Rate: 89 PR Interval: 142 QRS Duration: 76 QT Interval: 374 QTC Calculation: 455 R Axis: 78 Text Interpretation: Sinus rhythm Nonspecific T abnormalities, lateral leads ST elev, probable normal early repol pattern Partial missing lead(s): V2 V3 V4 V5 V6 No significant change was found  Assessment & Plan by Problem:  Chest pain most likely due to cocaine-induced vasospasm - Chest pain currently 1/10 which improved from 10/10 after nitroglycerin/asprin administration.Troponin x 2 negative and 12-lead EKG on admission with no ischemic changes and unchanged from previous with ACS unlikely. CTA chest with mild atherosclerotic calcification of the aortic arch with no calcification in the LAD. Pt with no evidence of aortic  dissection, PE, or pneumonia. Pt reports cocaine use 2 days ago with UDS positive for cocaine most likely vasospasm induced chest pain. Pt also with tenderness to palpation of left chest wall with no recent heavy lifting. Pt has history of GERD not on medical therapy with no reported reflux symptoms but history of alcohol use. -Monitor on telemetry  -Cycle troponins and repeat 12-lead EKG in AM -Restart aspirin 81 mg and lipitor 40 mg daily in setting of history of TIA/CVA -Nitroglycerin SL 0.4 mg Q 5 min PRN pain  -Tylenol 650 mg Q 6 hr PRN pain   -Start protonix 40 mg daily for GERD -Apply voltaren gel 2 g QID PRN for chest wall tenderness   -Obtain A1c, TSH, and lipid panel  -Pt counseled on cocaine abuse cessation   History of TIA/CVA - Pt with baseline right sided paresis and paraesthesias. MRI negative on admission.   -Restart aspirin 81 mg daily -Restart lipitor 40 mg daily   Alcohol Abuse - Pt reports last drink 1-2 days ago with history of 24 oz beer daily use for more than 10 years. Pt currently with no withdrawal symptoms.  -IV thiamine 100 mg once then start NS D5 100 mL/hr x 10 hrs  -Place on CIWA -Obtain HIV and acute hepatitis panel   Depression and Schizophrenia - Pt currently with stable mood.  -Restart home risperidone 2 mg and celexa 20 mg daily   GERD - Pt denies acid reflux symptoms. Pt with chronic alcohol use.   -Start protonix 40 mg daily   Diet: Regular  DVT Ppx: Lovenox Code: Full    Dispo: Disposition is deferred at this time, awaiting improvement of current medical problems. Anticipated discharge in approximately 1 day(s).   The patient does have a current PCP (No Pcp Per Patient) and does need an [facility] hospital follow-up appointment after discharge.  The patient does have transportation limitations that hinder transportation to clinic appointments.  Signed: Otis Brace, MD 11/06/2014, 1:00 PM

## 2014-11-06 NOTE — ED Notes (Signed)
States last time he did any cocaine was 3 days ago

## 2014-11-06 NOTE — Progress Notes (Signed)
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 Patient has had several "no call no show" and rescheduled appointments. Patient was educated on the importance of going to all scheduled appointments and maintaining a relationship with his pcp. Follow up appointment made with Dr.Zeller at the practice for Monday Dec 23, 2014 @ 2:45pm, pt verbalized understanding of this upcoming appointment. My contact information provided for any future questions or concerns. Patient will also be linked with a [facility] case manager after discharge. No other Community Health & Eligibility Specialist needs identified at this time.  Buddy DutyFelicia Evans [facility] [phone]

## 2014-11-06 NOTE — ED Notes (Signed)
Patient transported to CT 

## 2014-11-06 NOTE — ED Notes (Signed)
Patient presents via EMS stating approximately 1 hour PTA he started with left sided chest pain (no radiation)  EMS adm ASA 324mg , 1 NTG  8/10

## 2014-11-07 ENCOUNTER — Observation Stay (HOSPITAL_COMMUNITY): Payer: Self-pay

## 2014-11-07 ENCOUNTER — Encounter (HOSPITAL_COMMUNITY): Payer: MEDICAID

## 2014-11-07 ENCOUNTER — Ambulatory Visit (HOSPITAL_COMMUNITY): Payer: MEDICAID

## 2014-11-07 DIAGNOSIS — R0789 Other chest pain: Secondary | ICD-10-CM

## 2014-11-07 DIAGNOSIS — F1024 Alcohol dependence with alcohol-induced mood disorder: Secondary | ICD-10-CM

## 2014-11-07 DIAGNOSIS — E785 Hyperlipidemia, unspecified: Secondary | ICD-10-CM

## 2014-11-07 DIAGNOSIS — F1420 Cocaine dependence, uncomplicated: Secondary | ICD-10-CM

## 2014-11-07 LAB — NM MYOCAR MULTI W/SPECT W/WALL MOTION / EF
CHL CUP MPHR: 172 {beats}/min
CSEPEW: 1 METS
CSEPHR: 65 %
CSEPPHR: 113 {beats}/min
Exercise duration (min): 0 min
Exercise duration (sec): 0 s
Rest HR: 56 {beats}/min

## 2014-11-07 LAB — BASIC METABOLIC PANEL
Anion gap: 7 (ref 5–15)
BUN: 10 mg/dL (ref 6–20)
CALCIUM: 8.4 mg/dL — AB (ref 8.9–10.3)
CO2: 25 mmol/L (ref 22–32)
CREATININE: 1.22 mg/dL (ref 0.61–1.24)
Chloride: 104 mmol/L (ref 101–111)
Glucose, Bld: 88 mg/dL (ref 65–99)
Potassium: 4.3 mmol/L (ref 3.5–5.1)
SODIUM: 136 mmol/L (ref 135–145)

## 2014-11-07 LAB — HEPATITIS PANEL, ACUTE
HCV Ab: <0.1 s/co ratio (ref 0.0–0.9)
HEP A IGM: NEGATIVE
HEP B C IGM: NEGATIVE
Hepatitis B Surface Ag: NEGATIVE

## 2014-11-07 LAB — HIV ANTIBODY (ROUTINE TESTING W REFLEX): HIV Screen 4th Generation wRfx: NONREACTIVE

## 2014-11-07 LAB — HEMOGLOBIN A1C
HEMOGLOBIN A1C: 5.6 % (ref 4.8–5.6)
Mean Plasma Glucose: 114 mg/dL

## 2014-11-07 MED ORDER — REGADENOSON 0.4 MG/5ML IV SOLN
INTRAVENOUS | Status: AC
  Filled 2014-11-07: qty 5

## 2014-11-07 MED ORDER — DIPHENHYDRAMINE HCL 50 MG/ML IJ SOLN: 25.0000 mg | Freq: Once | INTRAMUSCULAR | Status: DC

## 2014-11-07 MED ORDER — TECHNETIUM TC 99M SESTAMIBI GENERIC - CARDIOLITE
30.0000 | Freq: Once | INTRAVENOUS | Status: AC | PRN
  Administered 2014-11-07: 30 via INTRAVENOUS

## 2014-11-07 MED ORDER — PROMETHAZINE HCL 25 MG/ML IJ SOLN: 12.5000 mg | Freq: Once | INTRAMUSCULAR | Status: DC

## 2014-11-07 MED ORDER — KETOROLAC TROMETHAMINE 30 MG/ML IJ SOLN: 30.0000 mg | Freq: Once | INTRAMUSCULAR | Status: DC

## 2014-11-07 MED ORDER — TECHNETIUM TC 99M SESTAMIBI GENERIC - CARDIOLITE
10.0000 | Freq: Once | INTRAVENOUS | Status: AC | PRN
  Administered 2014-11-07: 10 via INTRAVENOUS

## 2014-11-07 MED ORDER — ATORVASTATIN CALCIUM 40 MG PO TABS: 40.0000 mg | ORAL_TABLET | Freq: Every day | ORAL | Status: DC

## 2014-11-07 MED ORDER — ASPIRIN 81 MG PO CHEW: 81.0000 mg | CHEWABLE_TABLET | Freq: Every day | ORAL | Status: DC

## 2014-11-07 MED ORDER — REGADENOSON 0.4 MG/5ML IV SOLN
0.4000 mg | Freq: Once | INTRAVENOUS | Status: AC
  Administered 2014-11-07: 0.4 mg via INTRAVENOUS

## 2014-11-07 NOTE — Progress Notes (Signed)
Subjective: Patient seen and examined this morning. He reports he has no chest pain this morning. Only complaining of continued headaches today. Reports the migraine cocktail yesterday improved his symptoms overnight but they returned this morning.    Objective: Vital signs in last 24 hours: Filed Vitals:   11/07/14 1330 11/07/14 1332 11/07/14 1334 11/07/14 1335  BP: 117/83 122/73 120/77   Pulse: 96 96 89 90  Temp:      TempSrc:      Resp:      Height:      Weight:      SpO2:       Weight change: 1 lb 1.6 oz (0.499 kg) No intake or output data in the 24 hours ending 11/07/14 1422  PHYSICAL EXAM General: NAD, sitting up eating lunch  Neck: Supple, no JVD Heart: Normal rate and rhythm with no murmur.  Lungs: Clear to auscultation bilaterally with no wheezing, rales, or ronchi Abdomen: Soft, non-tender, non-distended, normal BS Neuro: A & O x 3 Extremities: No edema Skin: No rash  Medications: I have reviewed the patient's current medications. Scheduled Meds: . aspirin  81 mg Oral Daily  . atorvastatin  40 mg Oral q1800  . citalopram  20 mg Oral Daily  . enoxaparin (LOVENOX) injection  40 mg Subcutaneous Q24H  . folic acid  1 mg Oral Daily  . multivitamin with minerals  1 tablet Oral Daily  . pantoprazole  40 mg Oral Daily  . regadenoson      . risperiDONE  2 mg Oral QHS  . sodium chloride  3 mL Intravenous Q12H  . thiamine  100 mg Oral Daily   Continuous Infusions:  PRN Meds:.acetaminophen **OR** acetaminophen, diclofenac sodium, LORazepam **OR** LORazepam, nitroGLYCERIN, senna-docusate Assessment/Plan:  Chest pain most likely due to cocaine-induced vasospasm: No longer having any chest pain. Troponin negative x3. EKG with no ischemic changes.  CTA chest with mild atherosclerotic calcification of the aortic arch with no calcification in the LAD. Pt with no evidence of aortic dissection, PE, or pneumonia. Pt reports cocaine use 2 days ago with UDS positive for  cocaine most likely vasospasm induced chest pain. A1c 5.6. TSH wnl. Seen by cardiology today. Went for stress test today as patient is unlike to follow up outpatient and history of cocaine abuse and other risk factors there is at least moderate risk for cardiac etiology. Discharge pending results of stress test.  -Monitor on telemetry  -Restart aspirin 81 mg and lipitor 40 mg daily -Nitroglycerin SL 0.4 mg Q 5 min PRN pain  -Tylenol 650 mg Q 6 hr PRN pain  -Start protonix 40 mg daily for GERD -Apply voltaren gel 2 g QID PRN for chest wall tenderness  -Pt counseled on cocaine abuse cessation  -f/u stress test results  History of TIA/CVA - Pt with baseline right sided paresis and paraesthesias. MRI negative on admission. -Restart aspirin 81 mg daily -Restart lipitor 40 mg daily   Alcohol Abuse - Pt reports last drink 1-2 days ago with history of 24 oz beer daily use for more than 10 years. Pt currently with no withdrawal symptoms.  -IV thiamine 100 mg once then start NS D5 100 mL/hr x 10 hrs  -CIWA -HIV negative  -acute hepatitis panel negative  Depression and Schizophrenia - Pt currently with stable mood.  -home risperidone 2 mg and celexa 20 mg daily   GERD - Pt denies acid reflux symptoms. Pt with chronic alcohol use.  -protonix 40 mg daily  Diet: Regular   DVT Ppx: Lovenox  Code: Full   Dispo: Discharge pending stress test results  The patient does not have a current PCP (No Pcp Per Patient) and does [facility] after discharge.  The patient does not have transportation limitations that hinder transportation to clinic appointments.  .Services Needed at time of discharge: Y = Yes, Blank = No PT:   OT:   RN:   Equipment:   Other:       Valentino NoseNathan Charne Mcbrien, MD [facility] PGY-1 [phone] 11/07/2014, 2:22 PM

## 2014-11-07 NOTE — Discharge Instructions (Signed)
Your symptoms were from cocaine induced constriction of the blood vessels around your heart. All of your tests and labs for your heart function were okay. If you continue to use cocaine the symptoms will return and there is a high likelihood that you will damage your heart irreparably. The best thing you can do for your health is to quit using cocaine and quit smoking.   We have started you back on Aspirin 81 mg daily and Lipitor 40 mg daily. Please take these as prescribed.   [facility] on Wednesday October 19 at 2:00 PM with Dr. Venetia NightAmao.

## 2014-11-07 NOTE — Care Management Note (Addendum)
Case Management Note  Patient Details  Name: Lonnie JunesHoward J Aboud MRN: [id number] Date of Birth: 10/15/1965  Subjective/Objective:   Pt admitted for cp. Pt is without PCP and insurance.                 Action/Plan: CM did call TCC Liaison Peterson LombardJessica Beck to see if pt is appropriate for Transitional Care Services. If not approriate for TCC pt may be able [facility]. CM awaiting call back. Will continue to monitor.    Expected Discharge Date:                  Expected Discharge Plan:  Home/Self Care  In-House Referral:  NA  Discharge planning Services  CM Consult, Medication Assistance, Indigent Health Clinic  Post Acute Care Choice:  NA Choice offered to:  NA  DME Arranged:  N/A DME Agency:  NA  HH Arranged:  NA HH Agency:  NA  Status of Service:     Medicare Important Message Given:    Date Medicare IM Given:    Medicare IM give by:    Date Additional Medicare IM Given:    Additional Medicare Important Message give by:     If discussed at Long Length of Stay Meetings, dates discussed:    Additional Comments: 1430 Tomi BambergerBrenda Graves-Bigelow, RN, BSN [phone]  CM did speak with Peterson LombardJessica Beck with TCC and Hospital f/u appointment scheduled for 11-13-14 @ 2:00 pm with Dr. Venetia NightAmao. Pt will be able to utilize pharmacy on site as well med cost between $4.00 and $10.00. No further needs from CM at this time.    Gala LewandowskyGraves-Bigelow, Isidor Bromell Kaye, RN 11/07/2014, 2:20 PM

## 2014-11-07 NOTE — Hospital Discharge Follow-Up (Signed)
This Case Manager received call from Tomi BambergerBrenda Graves-Bigelow, RN CM who indicated patient uninsured and does not have a PCP.  Patient does not meet criteria for TCC; however, hospital follow-up appointment obtained with Dr. Venetia NightAmao on 11/13/14 at 1400 [facility]. AVS updated. Tomi BambergerBrenda Graves-Bigelow, RN CM also updated.

## 2014-11-07 NOTE — Progress Notes (Signed)
  Patient seen in Nuclear Medicine for 1-day Lexiscan. Was initially going to be an exercise stress test but patient reported he was having back pain secondary to a herniated disc so pharmacologic testing was performed.  [facility].  Signed, Ellsworth LennoxBrittany M Delia Slatten, PA-C 11/07/2014, 1:37 PM Pager: [phone]
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
## 2014-11-08 NOTE — Discharge Summary (Signed)
Name: Lonnie [name] MRN: [id number] DOB: May 07, 1965 49 y.o. PCP: No Pcp Per Patient  Date of Admission: 11/06/2014  5:39 AM Date of Discharge: 11/08/2014 Attending Physician: No att. providers found  Discharge Diagnosis: Principal Problem:   Chest pain Active Problems:   Alcohol dependence with alcohol-induced mood disorder (HCC)   Cocaine dependence (HCC)   Schizoaffective disorder (HCC)   TIA (transient ischemic attack)   Depression  Discharge Medications:   Medication List    TAKE these medications        aspirin 81 MG chewable tablet  Chew 1 tablet (81 mg total) by mouth daily.     atorvastatin 40 MG tablet  Commonly known as:  LIPITOR  Take 1 tablet (40 mg total) by mouth daily at 6 PM.     citalopram 20 MG tablet  Commonly known as:  CELEXA  Take 1 tablet (20 mg total) by mouth daily.     risperiDONE 2 MG tablet  Commonly known as:  RISPERDAL  Take 2 mg by mouth at bedtime.        Disposition and follow-up:   Lonnie [name] was discharged from [facility] in Stable condition.  At the hospital follow up visit please address:  1. Chest pain- patient's cardiac workup negative including myoview. Likely secondary to cocaine abuse. Please assess patient for further chest pain and cocaine use. 2. History of CVA- patient with residual right sided paresthesia and weakness from prior CVA. Restarted on ASA 81 mg daily and Lipitor 40 mg daily. Please assess patient's medication compliance.  3.  Labs / imaging needed at time of follow-up: None 4.  Pending labs/ test needing follow-up: None  Follow-up Appointments:     Follow-up Information    Follow up with [facility]     On 11/13/2014.   Why:  Hospital follow-up appointment on 11/13/14 at 2:00 pm with Dr. Venetia Night.   Contact information:   [address] [place] [address] [phone]      Discharge Instructions: Discharge Instructions      Diet - low sodium heart healthy    Complete by:  As directed      Increase activity slowly    Complete by:  As directed            Consultations: Treatment Team:  Rounding Lbcardiology, MD  Procedures Performed:  Dg Chest 2 View  11/06/2014  CLINICAL DATA:  Chest pain today. Headaches. History of hypertension. EXAM: CHEST  2 VIEW COMPARISON:  12/24/2013 FINDINGS: The heart size and mediastinal contours are within normal limits. Both lungs are clear. The visualized skeletal structures are unremarkable. IMPRESSION: No active cardiopulmonary disease. Electronically Signed   By: Burman Nieves M.D.   On: 11/06/2014 06:44   Ct Head Wo Contrast  11/06/2014  CLINICAL DATA:  Frontal headache with numbness of the right face. 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 This weakness is worse today. EXAM: CT HEAD WITHOUT CONTRAST TECHNIQUE: Contiguous axial images were obtained from the base of the skull through the vertex without intravenous contrast. COMPARISON:  08/09/2013 FINDINGS: The brainstem, cerebellum, cerebral peduncles, thalamus, basal ganglia, basilar cisterns, and ventricular system appear within normal limits. No intracranial hemorrhage, mass lesion, or acute CVA. On the prior MRI from 08/09/2013, the basilar artery was noted to demonstrate focal in diffuse narrowing; on today's exam the basilar artery is not well visualized. Chronic ethmoid mucosal thickening with minimal chronic sphenoid mucosal  thickening. IMPRESSION: 1. No acute intracranial findings are identified. 2. Chronic ethmoid and sphenoid sinusitis. Electronically Signed   By: Gaylyn Rong M.D.   On: 11/06/2014 07:42   Mr Brain Wo Contrast  11/06/2014  CLINICAL DATA:  Headache for 3 days.  Right hemiplegia EXAM: MRI HEAD WITHOUT CONTRAST TECHNIQUE: Multiplanar, multiecho pulse sequences of the brain and surrounding structures were obtained without intravenous contrast. COMPARISON:  08/09/2013 FINDINGS:  Calvarium and upper cervical spine: No focal marrow signal abnormality. Orbits: No significant findings. Sinuses and Mastoids: Clear. Brain: No infarct (punctate DWI hyperintensity in the superficial right frontal region on series 3, image 30 is likely susceptibility artifact from the inner table when correlated with coronal acquisition), hemorrhage, hydrocephalus, or mass lesion. No evidence of large vessel occlusion. No sulcal or intraventricular debris. IMPRESSION: Negative brain MRI. Electronically Signed   By: Marnee Spring M.D.   On: 11/06/2014 14:15   Nm Myocar Multi W/spect W/wall Motion / Ef  11/07/2014  CLINICAL DATA:  Chest pain, diabetes and hypertension. EXAM: MYOCARDIAL IMAGING WITH SPECT (PHARMACOLOGIC-STRESS) GATED LEFT VENTRICULAR WALL MOTION STUDY LEFT VENTRICULAR EJECTION FRACTION TECHNIQUE: Intravenous infusion of Lexiscan was performed under the supervision of the Cardiology staff. At peak effect of the drug, 10 mCi Tc-3m sestamibi was injected intravenously and standard myocardial SPECT imaging was performed. Quantitative gated imaging was also performed to evaluate left ventricular wall motion, and estimate left ventricular ejection fraction. COMPARISON:  None. FINDINGS: Perfusion: There is mild attenuation of the inferior wall on both rest and stress acquisitions. This is felt to be most likely related to diaphragmatic attenuation. There is no evidence of inducible myocardial ischemia. Wall Motion: There is suggestion of mild septal and anteroseptal hypokinesis. No akinetic or dyskinetic segments identified. Left Ventricular Ejection Fraction: 49 % End diastolic volume 81 ml End systolic volume 41 ml IMPRESSION: 1. No evidence of inducible myocardial ischemia. Mild attenuation of the inferior wall is felt to relate to diaphragmatic attenuation. 2. Suggestion of mild septal/anteroseptal hypokinesis. 3.  Mildly depressed left ventricular ejection fraction of 49% 4. Low-risk stress test  findings*. *2012 Appropriate Use Criteria for Coronary Revascularization Focused Update: J Am Coll Cardiol. 2012;59(9):857-881. http://content.dementiazones.com.aspx?articleid=[email or web address] Electronically Signed   By: Irish Lack M.D.   On: 11/07/2014 17:19   Ct Angio Chest Aorta W/cm &/or Wo/cm  11/06/2014  CLINICAL DATA:  Left upper chest pain radiating to the left upper back. Right-sided weakness. History of stroke. EXAM: CT ANGIOGRAPHY CHEST, ABDOMEN AND PELVIS TECHNIQUE: Multidetector CT imaging through the chest, abdomen and pelvis was performed using the standard protocol during bolus administration of intravenous contrast. Multiplanar reconstructed images and MIPs were obtained and reviewed to evaluate the vascular anatomy. CONTRAST:  OMNIPAQUE IOHEXOL 350 MG/ML SOLN COMPARISON:  Multiple exams, including 11/06/2014 chest radiograph and abdominal CT from 08/01/2014 FINDINGS: CTA CHEST FINDINGS Mediastinum/Nodes: The noncontrast images do not demonstrate findings characteristic of acute intramural hematoma. Systemic arterial phase images demonstrate minimal atherosclerotic calcification of the aortic arch. There is evidence of atherosclerotic calcification in the left anterior descending coronary artery. No thoracic aortic dissection. Today's exam was not optimized for opacification of the pulmonary arteries, but without mild reduction in sensitivity in mind, no filling defect is visible in the pulmonary arterial tree to suggest pulmonary embolus. Trace pericardial effusion observed anteriorly, increased from 08/01/2014. No overt pathologic adenopathy or specific esophageal abnormality observed. Lungs/Pleura: 1.9 by 0.7 by 0.8 cm filling defect in the right mainstem bronchus shown on image 21 series 3, along with  some mild central airway thickening. Musculoskeletal: Unremarkable Review of the MIP images confirms the above findings. CTA ABDOMEN AND PELVIS FINDINGS Hepatobiliary: No abnormal  arterial phase findings. Pancreas: Unremarkable Spleen: Unremarkable Adrenals/Urinary Tract: Abnormal wall thickening of the urinary bladder diffusely. Stomach/Bowel: Appendix normal. Questionable wall thickening in the rectum, which was not present on 08/01/2014. Vascular/Lymphatic: Widely patent celiac trunk, superior mesenteric artery, and inferior mesenteric artery with widely patent single bilateral renal arteries. Normal branch pattern of the celiac trunk. No abdominal aortic dissection. There is some mild infrarenal abdominal aortic atherosclerotic calcification which also tracks into the common and external iliac arteries. No critical stenosis. No pathologic adenopathy identified. Reproductive: There stranding along the right groin region likely from a hernia repair. There is also some fascia plane thickening along the right spermatic cord which also might relate to the hernia repair. Other: No supplemental non-categorized findings. Musculoskeletal: Bridging spurring of the right sacroiliac joint. Spurring of both acetabula. 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 There is evidence of degenerative disc disease in the lower lumbar spine with potential foraminal impingement especially at L3-4 and especially on the left. Review of the MIP images confirms the above findings. IMPRESSION: 1. No dissection. No findings of pulmonary embolus or other acute vascular abnormality. There is some mild atherosclerotic calcification in the left anterior descending coronary artery, in the aortic arch, and in the infrarenal abdominal aorta and iliac tree. No critical stenosis identified in the larger vessels. 2. Trace pericardial effusion anteriorly, but increased from 08/01/2014. 3. A filling defect of the right mainstem bronchus is most likely due to mucus, although not 100% specific. 4. Airway thickening is present, suggesting bronchitis or reactive airways disease. 5. Abnormal wall  thickening in the urinary bladder diffusely. Cystitis is not excluded. 6. Right groin hernia repair postoperative findings. Thickening of fascia planes along the right spermatic cord probably related to the hernia repair. 7. Degenerative disc disease especially at L3-4 potentially causing foraminal stenosis on the left. 8. Bridging spurring of the right sacroiliac joint. 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 9. Questionable wall thickening in the rectum, probably incidental given the normal appearance in this region on 08/01/2014. Electronically Signed   By: Gaylyn Rong M.D.   On: 11/06/2014 08:16   Ct Cta Abd/pel W/cm &/or W/o Cm  11/06/2014  CLINICAL DATA:  Left upper chest pain radiating to the left upper back. Right-sided weakness. History of stroke. EXAM: CT ANGIOGRAPHY CHEST, ABDOMEN AND PELVIS TECHNIQUE: Multidetector CT imaging through the chest, abdomen and pelvis was performed using the standard protocol during bolus administration of intravenous contrast. Multiplanar reconstructed images and MIPs were obtained and reviewed to evaluate the vascular anatomy. CONTRAST:  OMNIPAQUE IOHEXOL 350 MG/ML SOLN COMPARISON:  Multiple exams, including 11/06/2014 chest radiograph and abdominal CT from 08/01/2014 FINDINGS: CTA CHEST FINDINGS Mediastinum/Nodes: The noncontrast images do not demonstrate findings characteristic of acute intramural hematoma. Systemic arterial phase images demonstrate minimal atherosclerotic calcification of the aortic arch. There is evidence of atherosclerotic calcification in the left anterior descending coronary artery. No thoracic aortic dissection. Today's exam was not optimized for opacification of the pulmonary arteries, but without mild reduction in sensitivity in mind, no filling defect is visible in the pulmonary arterial tree to suggest pulmonary embolus. Trace pericardial effusion observed  anteriorly, increased from 08/01/2014. No overt pathologic adenopathy or specific esophageal abnormality observed. Lungs/Pleura: 1.9 by 0.7 by 0.8 cm filling defect in the right  mainstem bronchus shown on image 21 series 3, along with some mild central airway thickening. Musculoskeletal: Unremarkable Review of the MIP images confirms the above findings. CTA ABDOMEN AND PELVIS FINDINGS Hepatobiliary: No abnormal arterial phase findings. Pancreas: Unremarkable Spleen: Unremarkable Adrenals/Urinary Tract: Abnormal wall thickening of the urinary bladder diffusely. Stomach/Bowel: Appendix normal. Questionable wall thickening in the rectum, which was not present on 08/01/2014. Vascular/Lymphatic: Widely patent celiac trunk, superior mesenteric artery, and inferior mesenteric artery with widely patent single bilateral renal arteries. Normal branch pattern of the celiac trunk. No abdominal aortic dissection. There is some mild infrarenal abdominal aortic atherosclerotic calcification which also tracks into the common and external iliac arteries. No critical stenosis. No pathologic adenopathy identified. Reproductive: There stranding along the right groin region likely from a hernia repair. There is also some fascia plane thickening along the right spermatic cord which also might relate to the hernia repair. Other: No supplemental non-categorized findings. Musculoskeletal: Bridging spurring of the right sacroiliac joint. Spurring of both acetabula. 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 There is evidence of degenerative disc disease in the lower lumbar spine with potential foraminal impingement especially at L3-4 and especially on the left. Review of the MIP images confirms the above findings. IMPRESSION: 1. No dissection. No findings of pulmonary embolus or other acute vascular abnormality. There is some mild atherosclerotic calcification in the left anterior descending coronary  artery, in the aortic arch, and in the infrarenal abdominal aorta and iliac tree. No critical stenosis identified in the larger vessels. 2. Trace pericardial effusion anteriorly, but increased from 08/01/2014. 3. A filling defect of the right mainstem bronchus is most likely due to mucus, although not 100% specific. 4. Airway thickening is present, suggesting bronchitis or reactive airways disease. 5. Abnormal wall thickening in the urinary bladder diffusely. Cystitis is not excluded. 6. Right groin hernia repair postoperative findings. Thickening of fascia planes along the right spermatic cord probably related to the hernia repair. 7. Degenerative disc disease especially at L3-4 potentially causing foraminal stenosis on the left. 8. Bridging spurring of the right sacroiliac joint. 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 9. Questionable wall thickening in the rectum, probably incidental given the normal appearance in this region on 08/01/2014. Electronically Signed   By: Gaylyn Rong M.D.   On: 11/06/2014 08:16    Myoview:  Impression: 1. No evidence of inducible myocardial ischemia. Mild attenuation of the inferior wall is felt to relate to diaphragmatic attenuation. 2. Suggestion of mild septal/anteroseptal hypokinesis. 3. Mildly depressed left ventricular ejection fraction of 49% 4. Low-risk stress test findings   Admission HPI:  Lonnie [name] is 49 year old man with past medical history of hypertension, hyperlipidemia, polysubstance abuse, depression, lumbar DDD, right inguinal hernia s/p repair, schizophrenia, TIA/CVA, and GERD who presents with chief complaint of chest pain.   He reports acute onset of left sided upper back pain with radiation to the right side of his chest this morning at 3-4 AM while smoking cigarettes in a [employer] parking lot. He describes the pain as 10/10 sharp, stabbing, and constant with  associated palpitations, nausea, lightheadedness, hypersalivation, headache, and right facial twitching. He reports having chest pain many years ago but not as severe as this episode. He has not had prior heart catherization or stress testing to his knowledge. His echo in 2015 revealed normal EF with grade 1 diastolic dysfunction. He has chronic DOE after 1-2 blocks and occasional LE edema but  denies dyspnea, orthopnea, weight gain, or PND. He has family history of MI in his living mother (age 48). He denies heavy lifting and reports mild tenderness to palpation of his left chest. His chest pain improved significantly with aspirin and SL nitroglycerin administration by EMS. He smokes cocaine weekly with last use two days ago. His UDS on admission was positive for cocaine. He drinks approximately 24 oz beer daily (drinking for more than 10 years) with last drink 1-2 days ago. He has history of GERD and is not currently on any medications. He denies acid reflux symptoms. He denies history of PE.   He reports having a CVA/TIA last July with residual right sided weakness and paresthesias. 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 He reports intermittent blurry vision since three days ago. He recently restarted taking aspirin 325 mg daily since the vision changes started. He is not taking lipitor. He denies new weakness, paraesthesias, slurred speech, dysphagia, or facial droop.   Hospital Course by problem list:   Chest pain most likely due to cocaine-induced vasospasm: Troponins were negative x3. EKG showed no ischemic changes.CTA chest done in the ED showed mild atherosclerotic calcification of the aortic arch with no calcification in the LAD, no evidence of aortic dissection, PE, or pneumonia. Patientt reported cocaine use 2 days prior to admission and UDS was positive for  cocaine. HgbA1c, TSH and lipid panel were checked for possible risk factors of ACS. A1c was 5.6, TSH wnl and lipid panel with total cholesterol of 190 and HDL of 68. Cardiology was consulted and patient underwent myoview stress test as patient was unlikely to follow up outpatient as well as his history of cocaine abuse and other risk factors there was at least moderate risk for cardiac etiology of his chest pain. Myoview was a low risk study. His chest pain resolved in the hospital.   History of TIA/CVA - Patient had baseline right sided paresis and paraesthesias. MRI negative for stroke on admission.Restarted aspirin 81 mg daily and lipitor 40 mg daily given CVA history.  Alcohol Abuse - Patient reported last drink 1-2 days prior to admission with history of 24 oz beer daily use for more than 10 years. He had no withdrawal symptoms during admission. He was started on thiamine and folate. HIV and hepatitis panels was done and were both negative.   Depression and Schizophrenia - Patient's mood was stable during hospitalization. Continued  home risperidone 2 mg and celexa 20 mg daily.  Discharge Vitals:   BP 120/77 mmHg  Pulse 90  Temp(Src) 97.9 F (36.6 C) (Oral)  Resp 18  Ht 5\' 11"  (1.803 m)  Wt 180 lb 12.4 oz (82 kg)  BMI 25.22 kg/m2  SpO2 99%  Discharge Labs:  No results found for this or any previous visit (from the past 24 hour(s)).  Signed: Valentino NoseNathan Tamel Abel, MD 11/08/2014, 6:52 AM

## 2014-11-13 ENCOUNTER — Inpatient Hospital Stay: Payer: Self-pay | Admitting: Family Medicine

## 2014-11-23 ENCOUNTER — Emergency Department (HOSPITAL_BASED_OUTPATIENT_CLINIC_OR_DEPARTMENT_OTHER)
Admission: EM | Admit: 2014-11-23 | Discharge: 2014-11-23 | Disposition: A | Discharge status: Home / Self Care | Payer: Self-pay | Attending: Emergency Medicine | Admitting: Emergency Medicine

## 2014-11-23 ENCOUNTER — Emergency Department (HOSPITAL_BASED_OUTPATIENT_CLINIC_OR_DEPARTMENT_OTHER): Payer: Self-pay

## 2014-11-23 ENCOUNTER — Encounter (HOSPITAL_BASED_OUTPATIENT_CLINIC_OR_DEPARTMENT_OTHER): Payer: Self-pay

## 2014-11-23 DIAGNOSIS — Z79899 Other long term (current) drug therapy: Secondary | ICD-10-CM | POA: Insufficient documentation

## 2014-11-23 DIAGNOSIS — Z720 Tobacco use: Secondary | ICD-10-CM | POA: Insufficient documentation

## 2014-11-23 DIAGNOSIS — Z8701 Personal history of pneumonia (recurrent): Secondary | ICD-10-CM | POA: Insufficient documentation

## 2014-11-23 DIAGNOSIS — K0889 Other specified disorders of teeth and supporting structures: Secondary | ICD-10-CM | POA: Insufficient documentation

## 2014-11-23 DIAGNOSIS — Z7982 Long term (current) use of aspirin: Secondary | ICD-10-CM | POA: Insufficient documentation

## 2014-11-23 DIAGNOSIS — H9209 Otalgia, unspecified ear: Secondary | ICD-10-CM | POA: Insufficient documentation

## 2014-11-23 DIAGNOSIS — Z8673 Personal history of transient ischemic attack (TIA), and cerebral infarction without residual deficits: Secondary | ICD-10-CM | POA: Insufficient documentation

## 2014-11-23 DIAGNOSIS — M1990 Unspecified osteoarthritis, unspecified site: Secondary | ICD-10-CM | POA: Insufficient documentation

## 2014-11-23 DIAGNOSIS — G8929 Other chronic pain: Secondary | ICD-10-CM | POA: Insufficient documentation

## 2014-11-23 DIAGNOSIS — F209 Schizophrenia, unspecified: Secondary | ICD-10-CM | POA: Insufficient documentation

## 2014-11-23 DIAGNOSIS — F319 Bipolar disorder, unspecified: Secondary | ICD-10-CM | POA: Insufficient documentation

## 2014-11-23 DIAGNOSIS — I10 Essential (primary) hypertension: Secondary | ICD-10-CM | POA: Insufficient documentation

## 2014-11-23 DIAGNOSIS — R112 Nausea with vomiting, unspecified: Secondary | ICD-10-CM | POA: Insufficient documentation

## 2014-11-23 DIAGNOSIS — K219 Gastro-esophageal reflux disease without esophagitis: Secondary | ICD-10-CM | POA: Insufficient documentation

## 2014-11-23 MED ORDER — IBUPROFEN 800 MG PO TABS
800.0000 mg | ORAL_TABLET | Freq: Once | ORAL | Status: AC
  Administered 2014-11-23: 800 mg via ORAL
  Filled 2014-11-23: qty 1

## 2014-11-23 MED ORDER — IBUPROFEN 800 MG PO TABS: 800.0000 mg | ORAL_TABLET | Freq: Three times a day (TID) | ORAL | Status: DC

## 2014-11-23 MED ORDER — ONDANSETRON 4 MG PO TBDP: 4.0000 mg | ORAL_TABLET | Freq: Three times a day (TID) | ORAL | Status: DC | PRN

## 2014-11-23 MED ORDER — AMOXICILLIN-POT CLAVULANATE 875-125 MG PO TABS
1.0000 | ORAL_TABLET | Freq: Two times a day (BID) | ORAL | Status: DC
Start: 2014-11-23 — End: 2014-11-27

## 2014-11-23 MED ORDER — ONDANSETRON 4 MG PO TBDP
4.0000 mg | ORAL_TABLET | Freq: Once | ORAL | Status: AC
  Administered 2014-11-23: 4 mg via ORAL
  Filled 2014-11-23: qty 1

## 2014-11-23 NOTE — Discharge Instructions (Signed)
You have been seen today for dental pain, nausea and vomiting. Your imaging revealed no abnormalities. It is recommended that you see a dentist as soon as possible. Follow up with PCP as needed. Return to ED should symptoms worsen.   Emergency Department Resource Guide 1) Find a Doctor and Pay Out of Pocket Although you won't have to find out who is covered by your insurance plan, it is a good idea to ask around and get recommendations. You will then need to call the office and see if the doctor you have chosen will accept you as a new patient and what types of options they offer for patients who are self-pay. Some doctors offer discounts or will set up payment plans for their patients who do not have insurance, but you will need to ask so you aren't surprised when you get to your appointment.  2) Contact Your Local Health Department Not all health departments have doctors that can see patients for sick visits, but many do, so it is worth a call to see if yours does. If you don't know where your local health department is, you can check in your phone book. The CDC also has a tool to help you locate your state's health department, and many state websites also have listings of all of their local health departments.  3) Find a Walk-in Clinic If your illness is not likely to be very severe or complicated, you may want to try a walk in clinic. These are popping up all over the country in pharmacies, drugstores, and shopping centers. They're usually staffed by nurse practitioners or physician assistants that have been trained to treat common illnesses and complaints. They're usually fairly quick and inexpensive. However, if you have serious medical issues or chronic medical problems, these are probably not your best option.  No Primary Care Doctor: - Call Health Connect at  [phone] - they can help you locate a primary care doctor that  accepts your insurance, provides certain services, etc. - Physician  Referral Service- [phone]  Chronic Pain Problems: Organization         Address  Phone   Notes  [facility]  [phone] Patients need to be referred by their primary care doctor.   Medication Assistance: Organization         Address  Phone   Notes  [facility] [address] [place], [place] [address] [phone] --Must be a resident of [facility] [place] -- Must have NO insurance coverage whatsoever (no Medicaid/ Medicare, etc.) -- The pt. MUST have a primary care doctor that directs their care regularly and follows them in the community   MedAssist  [phone]   [employer]  [phone]    Agencies that provide inexpensive medical care: Organization         Address  Phone   Notes  [facility]  [phone]   [facility]    [phone]   [facility] [address] [place], [place] [address] [phone]   [facility] [address], [place] [phone]   [facility]anned Parenthood    [phone]   [facility]    [phone]   [facility]  [address], [place] Phone:  [phone], Fax:  [phone] Hours of Operation:  9 am - 6 pm, M-F.  Also accepts Medicaid/Medicare and  self-pay.  [facility] for [place] [address], [address], [place] Phone: [phone], Fax: [phone]. Hours of Operation:  8:30 am - 5:30 pm, M-F.  Also accepts Medicaid and self-pay.  [facility] [address] [place] Phone: [phone]   [place], [place], [place] [phone], Ext. [phone] Mondays & Thursdays: 7-9 AM.  First 15 patients are seen on a first come, first serve basis.    [place] Providers:  Organization         Address  Phone   Notes  [facility] [address], [place] [phone] Also accepts self-pay patients.  [facility] [address], [place]  [phone]   [place], [address], [place] [phone]   [facility] [address] [place] [phone]   Lucianne Lei [address], [place]   [phone] Only accepts [place] Access [place] patients after they have their name applied to their card.   Self-Pay (no insurance) in [employer]:  Organization         Address  Phone   Notes  Sickle Cell Patients, [facility] [place] [phone]   [facility] [place] [phone]   [facility] [place]  [place], [place],  [phone]   [facility]/Dr. Osei-Bonsu  [address] [place] or [address], [place] [phone] Phone number for both [place] and [place] locations is the same.  [facility]gent Medical and [facility] [address] [place] [phone]   [facility] [address], [place] or [address] [phone]   [facility] [address] [place] [phone], phone; [phone], fax Sees patients 1st and 3rd Saturday of every month.  Must not qualify for public or private insurance (i.e. Medicaid, Medicare, [place] Health Choice, Veterans' Benefits)  Household income should be no more than 200% of the poverty level The clinic cannot treat you if you are pregnant or think you are pregnant  Sexually transmitted diseases are not treated at the clinic.    Dental Care: Organization         Address  Phone  Notes  [facility] Department of [facility] [phone] Accepts children up to age 40 who are enrolled  in [place] or [place]; pregnant women with a Medicaid card; and children who have applied for Medicaid or [place] Health Choice, but were declined, whose parents can pay a reduced fee at time of service.  [facility] Department of [facility]  [address], [place] [phone] Accepts children up to age 37 who are enrolled in [place] or [place]; pregnant women with a Medicaid card; and children who have applied for Medicaid or [place] Health Choice, but were declined, whose parents can pay a reduced fee at time of service.  [place] [facility] Access PROGRAM  [place] [phone] Patients are seen by appointment only. Walk-ins are not accepted. [place] will see patients 57 years of age and older. Monday - Tuesday (8am-5pm) Most Wednesdays (8:30-5pm) $  30 per visit, cash only  [facility] Adult [employer] PROGRAM  [address], [facility] [phone] Patients are seen by appointment only. Walk-ins are not accepted. [place] will see patients 62 years of age and older. One Wednesday Evening (Monthly: Volunteer Based).  $30 per visit, cash only  Juana Di­az  [phone] for adults; Children under age 14, call Graduate Pediatric Dentistry at [phone]. Children aged 37-14, please call [phone] to request a pediatric application.  Dental services are provided in all areas of dental care including fillings, crowns and bridges, complete and partial dentures, implants, gum treatment, root canals, and extractions. Preventive care is also provided. Treatment is provided to both adults and children. Patients are selected via a lottery and there is often a waiting list.   [facility] [address] [place]  [phone] [email or web address]   [facility] [address] [place], [place] [phone], Ext. [phone] Second and Fourth Thursday of each month, opens at  6:30 AM; Clinic ends at 9 AM.  Patients are seen on a first-come first-served basis, and a limited number are seen during each clinic.   [facility]  [address] Hillard Danker [place], [place] [phone]   Eligibility Requirements You must have lived in [place], [place], or [place] counties for at least the last three months.   You cannot be eligible for state or federal sponsored [employer], including [employer], [place], or [employer].   You generally cannot be eligible for healthcare insurance through your employer.    How to apply: Eligibility screenings are held every Tuesday and Wednesday afternoon from 1:00 pm until 4:00 pm. You do not need an appointment for the interview!  [facility] [address] [place], [place]   [place]  [place] Department  [place]  [phone]    Behavioral Health Resources in the Community: Intensive Outpatient Programs Organization         Address  Phone  Notes  [facility] [place]. [address] [place], [place] [phone]   [facility]patient [address], [place], [place]   ADS: Alcohol & Drug Svcs [address] [place], [employer]   [place] [address]  [place], [place] or [phone]   Substance Abuse Resources Organization         Address  Phone  Notes  Alcohol and Drug Services  [phone]   [place]  [phone]   [facility] [place]   Chinita Pester  [phone]   Residential & Outpatient Substance Abuse Program  [phone]   Psychological Services Organization         Address  Phone  Notes  [facility]  [place]  [phone]   [place] [address] [place] or [phone]    Mobile Crisis Teams Organization         Address  Phone  Notes  [facility], Mobile Crisis Care Unit  [phone]   [facility]  [address] [place], [place]   [place] Levels [address] [place] [phone]    Self-Help/Support Groups Organization         Address  Phone  Notes  [facility] of [place] - variety of support groups  [place] Call for more information  Narcotics Anonymous (NA), [facility] [address], [employer] [place]  2 meetings at this location   Special educational needs teacher         Address  Phone  Notes  ASAP Residential Treatment [place],    [place]  [phone]   [facility]  [address], [place] [phone], [place], [place]   [place], [place] [phone] Admissions: 8am-3pm M-F  Incentives Substance [place] [address],    [place], [place] [phone]   [facility] [address] [place], [place], [place]   [facility] [address],  [place], [place]   [facility]ight Programs - Intensive Outpatient [address] Kristeen [address], [place], [place]   [facility] ([place] [address].,  [place], [place] [phone] or [phone]   [facility] Services (RTS) [address] [place], [place] Accepts Medicaid  [place] [address]  [place] [phone] Substance Abuse/Addiction Treatment   [facility] Organization         Address  Phone  Notes  [facility]  [phone]   Domenic Schwab, PhD [address] Arlis Porta [place], [place]   [phone] or [phone]   [place], [place] [phone]   [facility]  [address] [place], [place] [phone] Insurance/Medicaid/sponsorship through [facility] and Families [address], [address] [place]                                    [place], [place] [phone] [place] [address], [place] [phone]    Dr. Adele Schilder  [phone]   [facility] Dept. 1) [address], [place] 2) [place] 3)  [place] [address], [place] [phone]  [phone]   [place] [phone] or [phone] (After Hours)

## 2014-11-23 NOTE — ED Notes (Signed)
Patient verbalizes understanding of discharge instructions and medication.  Patient is stable and ambulatory.

## 2014-11-23 NOTE — ED Notes (Addendum)
Pt reports 2 day history of left and right, upper and lower dental pain, reports caries, broken teeth and dental abscesses, also states he has had "migraine headaches and throwing up." Pt states he was dropped off at ED by his aunt.
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
## 2014-11-27 ENCOUNTER — Emergency Department (HOSPITAL_BASED_OUTPATIENT_CLINIC_OR_DEPARTMENT_OTHER): Payer: Self-pay

## 2014-11-27 ENCOUNTER — Encounter (HOSPITAL_BASED_OUTPATIENT_CLINIC_OR_DEPARTMENT_OTHER): Payer: Self-pay | Admitting: *Deleted

## 2014-11-27 ENCOUNTER — Emergency Department (HOSPITAL_BASED_OUTPATIENT_CLINIC_OR_DEPARTMENT_OTHER)
Admission: EM | Admit: 2014-11-27 | Discharge: 2014-11-27 | Disposition: A | Discharge status: Home / Self Care | Payer: Self-pay | Attending: Emergency Medicine | Admitting: Emergency Medicine

## 2014-11-27 DIAGNOSIS — Z8719 Personal history of other diseases of the digestive system: Secondary | ICD-10-CM | POA: Insufficient documentation

## 2014-11-27 DIAGNOSIS — Z8673 Personal history of transient ischemic attack (TIA), and cerebral infarction without residual deficits: Secondary | ICD-10-CM | POA: Insufficient documentation

## 2014-11-27 DIAGNOSIS — Z79899 Other long term (current) drug therapy: Secondary | ICD-10-CM | POA: Insufficient documentation

## 2014-11-27 DIAGNOSIS — I10 Essential (primary) hypertension: Secondary | ICD-10-CM | POA: Insufficient documentation

## 2014-11-27 DIAGNOSIS — R1013 Epigastric pain: Secondary | ICD-10-CM | POA: Insufficient documentation

## 2014-11-27 DIAGNOSIS — Z8701 Personal history of pneumonia (recurrent): Secondary | ICD-10-CM | POA: Insufficient documentation

## 2014-11-27 DIAGNOSIS — M170 Bilateral primary osteoarthritis of knee: Secondary | ICD-10-CM | POA: Insufficient documentation

## 2014-11-27 DIAGNOSIS — R109 Unspecified abdominal pain: Secondary | ICD-10-CM

## 2014-11-27 DIAGNOSIS — G8929 Other chronic pain: Secondary | ICD-10-CM | POA: Insufficient documentation

## 2014-11-27 DIAGNOSIS — F419 Anxiety disorder, unspecified: Secondary | ICD-10-CM | POA: Insufficient documentation

## 2014-11-27 DIAGNOSIS — R17 Unspecified jaundice: Secondary | ICD-10-CM | POA: Insufficient documentation

## 2014-11-27 DIAGNOSIS — Z720 Tobacco use: Secondary | ICD-10-CM | POA: Insufficient documentation

## 2014-11-27 DIAGNOSIS — E119 Type 2 diabetes mellitus without complications: Secondary | ICD-10-CM | POA: Insufficient documentation

## 2014-11-27 DIAGNOSIS — Z7982 Long term (current) use of aspirin: Secondary | ICD-10-CM | POA: Insufficient documentation

## 2014-11-27 DIAGNOSIS — F209 Schizophrenia, unspecified: Secondary | ICD-10-CM | POA: Insufficient documentation

## 2014-11-27 DIAGNOSIS — F319 Bipolar disorder, unspecified: Secondary | ICD-10-CM | POA: Insufficient documentation

## 2014-11-27 DIAGNOSIS — R112 Nausea with vomiting, unspecified: Secondary | ICD-10-CM | POA: Insufficient documentation

## 2014-11-27 LAB — HEPATIC FUNCTION PANEL
ALT: 45 U/L (ref 17–63)
AST: 33 U/L (ref 15–41)
Albumin: 4.2 g/dL (ref 3.5–5.0)
Alkaline Phosphatase: 46 U/L (ref 38–126)
BILIRUBIN INDIRECT: 1.4 mg/dL — AB (ref 0.3–0.9)
Bilirubin, Direct: 0.1 mg/dL (ref 0.1–0.5)
TOTAL PROTEIN: 7.6 g/dL (ref 6.5–8.1)
Total Bilirubin: 1.5 mg/dL — ABNORMAL HIGH (ref 0.3–1.2)

## 2014-11-27 LAB — URINALYSIS, ROUTINE W REFLEX MICROSCOPIC
Glucose, UA: NEGATIVE mg/dL
Hgb urine dipstick: NEGATIVE
KETONES UR: NEGATIVE mg/dL
Leukocytes, UA: NEGATIVE
NITRITE: NEGATIVE
PROTEIN: NEGATIVE mg/dL
SPECIFIC GRAVITY, URINE: 1.025 (ref 1.005–1.030)
UROBILINOGEN UA: 2 mg/dL — AB (ref 0.0–1.0)
pH: 6 (ref 5.0–8.0)

## 2014-11-27 LAB — CBC WITH DIFFERENTIAL/PLATELET
BASOS PCT: 1 %
Basophils Absolute: 0.1 10*3/uL (ref 0.0–0.1)
EOS ABS: 0.1 10*3/uL (ref 0.0–0.7)
Eosinophils Relative: 1 %
HCT: 45.3 % (ref 39.0–52.0)
HEMOGLOBIN: 15.3 g/dL (ref 13.0–17.0)
Lymphocytes Relative: 45 %
Lymphs Abs: 2.9 10*3/uL (ref 0.7–4.0)
MCH: 28.2 pg (ref 26.0–34.0)
MCHC: 33.8 g/dL (ref 30.0–36.0)
MCV: 83.4 fL (ref 78.0–100.0)
Monocytes Absolute: 0.6 10*3/uL (ref 0.1–1.0)
Monocytes Relative: 9 %
NEUTROS PCT: 44 %
Neutro Abs: 2.9 10*3/uL (ref 1.7–7.7)
PLATELETS: 362 10*3/uL (ref 150–400)
RBC: 5.43 MIL/uL (ref 4.22–5.81)
RDW: 14 % (ref 11.5–15.5)
WBC: 6.6 10*3/uL (ref 4.0–10.5)

## 2014-11-27 LAB — BASIC METABOLIC PANEL
Anion gap: 7 (ref 5–15)
BUN: 12 mg/dL (ref 6–20)
CHLORIDE: 102 mmol/L (ref 101–111)
CO2: 29 mmol/L (ref 22–32)
CREATININE: 1.08 mg/dL (ref 0.61–1.24)
Calcium: 9.3 mg/dL (ref 8.9–10.3)
Glucose, Bld: 88 mg/dL (ref 65–99)
POTASSIUM: 4 mmol/L (ref 3.5–5.1)
SODIUM: 138 mmol/L (ref 135–145)

## 2014-11-27 LAB — LIPASE, BLOOD: LIPASE: 26 U/L (ref 11–51)

## 2014-11-27 MED ORDER — IOHEXOL 300 MG/ML  SOLN
25.0000 mL | Freq: Once | INTRAMUSCULAR | Status: AC | PRN
  Administered 2014-11-27: 25 mL via ORAL

## 2014-11-27 MED ORDER — PANTOPRAZOLE SODIUM 20 MG PO TBEC: 20.0000 mg | DELAYED_RELEASE_TABLET | Freq: Two times a day (BID) | ORAL | Status: DC

## 2014-11-27 MED ORDER — SODIUM CHLORIDE 0.9 % IV SOLN
1000.0000 mL | INTRAVENOUS | Status: DC
  Administered 2014-11-27: 1000 mL via INTRAVENOUS

## 2014-11-27 MED ORDER — ONDANSETRON HCL 4 MG/2ML IJ SOLN
4.0000 mg | Freq: Once | INTRAMUSCULAR | Status: AC
  Administered 2014-11-27: 4 mg via INTRAVENOUS
  Filled 2014-11-27: qty 2

## 2014-11-27 MED ORDER — IOHEXOL 300 MG/ML  SOLN
100.0000 mL | Freq: Once | INTRAMUSCULAR | Status: AC | PRN
  Administered 2014-11-27: 100 mL via INTRAVENOUS

## 2014-11-27 MED ORDER — SODIUM CHLORIDE 0.9 % IV SOLN
1000.0000 mL | Freq: Once | INTRAVENOUS | Status: AC
  Administered 2014-11-27: 1000 mL via INTRAVENOUS

## 2014-11-27 MED ORDER — HYDROMORPHONE HCL 1 MG/ML IJ SOLN
1.0000 mg | Freq: Once | INTRAMUSCULAR | Status: AC
  Administered 2014-11-27: 1 mg via INTRAVENOUS
  Filled 2014-11-27: qty 1

## 2014-11-27 MED ORDER — GI COCKTAIL ~~LOC~~
30.0000 mL | Freq: Once | ORAL | Status: AC
  Administered 2014-11-27: 30 mL via ORAL
  Filled 2014-11-27: qty 30

## 2014-11-27 NOTE — ED Notes (Signed)
C/o abd pain x 3 days  w n/v,  Denies urinary sx,  No diarrhea

## 2014-11-27 NOTE — ED Notes (Signed)
Pt amb to triage with quick steady gait in nad. Pt reports 3 days of epigastric pain with n/v. Last bm 2 days ago, normal. Denies fevers.
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
## 2014-12-14 ENCOUNTER — Encounter (HOSPITAL_COMMUNITY): Payer: Self-pay | Admitting: *Deleted

## 2014-12-14 ENCOUNTER — Emergency Department (HOSPITAL_COMMUNITY)
Admission: EM | Admit: 2014-12-14 | Discharge: 2014-12-15 | Disposition: A | Discharge status: Home / Self Care | Payer: Self-pay | Attending: Emergency Medicine | Admitting: Emergency Medicine

## 2014-12-14 DIAGNOSIS — F17210 Nicotine dependence, cigarettes, uncomplicated: Secondary | ICD-10-CM | POA: Insufficient documentation

## 2014-12-14 DIAGNOSIS — F1410 Cocaine abuse, uncomplicated: Secondary | ICD-10-CM | POA: Insufficient documentation

## 2014-12-14 DIAGNOSIS — F1910 Other psychoactive substance abuse, uncomplicated: Secondary | ICD-10-CM

## 2014-12-14 DIAGNOSIS — F1510 Other stimulant abuse, uncomplicated: Secondary | ICD-10-CM | POA: Insufficient documentation

## 2014-12-14 DIAGNOSIS — M1990 Unspecified osteoarthritis, unspecified site: Secondary | ICD-10-CM | POA: Insufficient documentation

## 2014-12-14 DIAGNOSIS — Z8673 Personal history of transient ischemic attack (TIA), and cerebral infarction without residual deficits: Secondary | ICD-10-CM | POA: Insufficient documentation

## 2014-12-14 DIAGNOSIS — F1210 Cannabis abuse, uncomplicated: Secondary | ICD-10-CM | POA: Insufficient documentation

## 2014-12-14 DIAGNOSIS — Z79899 Other long term (current) drug therapy: Secondary | ICD-10-CM | POA: Insufficient documentation

## 2014-12-14 DIAGNOSIS — Z8701 Personal history of pneumonia (recurrent): Secondary | ICD-10-CM | POA: Insufficient documentation

## 2014-12-14 DIAGNOSIS — E119 Type 2 diabetes mellitus without complications: Secondary | ICD-10-CM | POA: Insufficient documentation

## 2014-12-14 DIAGNOSIS — Z7982 Long term (current) use of aspirin: Secondary | ICD-10-CM | POA: Insufficient documentation

## 2014-12-14 DIAGNOSIS — K2970 Gastritis, unspecified, without bleeding: Secondary | ICD-10-CM | POA: Insufficient documentation

## 2014-12-14 DIAGNOSIS — F319 Bipolar disorder, unspecified: Secondary | ICD-10-CM | POA: Insufficient documentation

## 2014-12-14 DIAGNOSIS — F419 Anxiety disorder, unspecified: Secondary | ICD-10-CM | POA: Insufficient documentation

## 2014-12-14 DIAGNOSIS — K219 Gastro-esophageal reflux disease without esophagitis: Secondary | ICD-10-CM | POA: Insufficient documentation

## 2014-12-14 DIAGNOSIS — F209 Schizophrenia, unspecified: Secondary | ICD-10-CM | POA: Insufficient documentation

## 2014-12-14 DIAGNOSIS — I10 Essential (primary) hypertension: Secondary | ICD-10-CM | POA: Insufficient documentation

## 2014-12-14 DIAGNOSIS — G8929 Other chronic pain: Secondary | ICD-10-CM | POA: Insufficient documentation

## 2014-12-14 DIAGNOSIS — R0789 Other chest pain: Secondary | ICD-10-CM | POA: Insufficient documentation

## 2014-12-14 NOTE — ED Notes (Signed)
Pt. Has history of gastritis. Went to bed with some abdominal pain and woke up to a sharp left sided chest pain with radiation to back.. Pt. Received 324 of aspirin and 1 nitro with EMS.

## 2014-12-14 NOTE — ED Notes (Signed)
N/V x3 days. Denies diarrhea. CP this evening.

## 2014-12-15 ENCOUNTER — Emergency Department (HOSPITAL_COMMUNITY): Payer: Self-pay

## 2014-12-15 LAB — RAPID URINE DRUG SCREEN, HOSP PERFORMED
Amphetamines: POSITIVE — AB
BARBITURATES: NOT DETECTED
Benzodiazepines: NOT DETECTED
Cocaine: POSITIVE — AB
Opiates: NOT DETECTED
TETRAHYDROCANNABINOL: POSITIVE — AB

## 2014-12-15 LAB — CBC WITH DIFFERENTIAL/PLATELET
Basophils Absolute: 0 10*3/uL (ref 0.0–0.1)
Basophils Relative: 0 %
EOS ABS: 0.1 10*3/uL (ref 0.0–0.7)
Eosinophils Relative: 2 %
HEMATOCRIT: 46.8 % (ref 39.0–52.0)
HEMOGLOBIN: 15.7 g/dL (ref 13.0–17.0)
LYMPHS ABS: 3.4 10*3/uL (ref 0.7–4.0)
LYMPHS PCT: 55 %
MCH: 28.5 pg (ref 26.0–34.0)
MCHC: 33.5 g/dL (ref 30.0–36.0)
MCV: 84.9 fL (ref 78.0–100.0)
MONOS PCT: 8 %
Monocytes Absolute: 0.5 10*3/uL (ref 0.1–1.0)
NEUTROS ABS: 2.2 10*3/uL (ref 1.7–7.7)
NEUTROS PCT: 35 %
Platelets: 338 10*3/uL (ref 150–400)
RBC: 5.51 MIL/uL (ref 4.22–5.81)
RDW: 13.6 % (ref 11.5–15.5)
WBC: 6.2 10*3/uL (ref 4.0–10.5)

## 2014-12-15 LAB — COMPREHENSIVE METABOLIC PANEL
ALK PHOS: 51 U/L (ref 38–126)
ALT: 24 U/L (ref 17–63)
ANION GAP: 10 (ref 5–15)
AST: 32 U/L (ref 15–41)
Albumin: 3.8 g/dL (ref 3.5–5.0)
BILIRUBIN TOTAL: 2.1 mg/dL — AB (ref 0.3–1.2)
BUN: 16 mg/dL (ref 6–20)
CALCIUM: 9 mg/dL (ref 8.9–10.3)
CO2: 26 mmol/L (ref 22–32)
CREATININE: 1.49 mg/dL — AB (ref 0.61–1.24)
Chloride: 97 mmol/L — ABNORMAL LOW (ref 101–111)
GFR, EST NON AFRICAN AMERICAN: 54 mL/min — AB (ref >60)
Glucose, Bld: 81 mg/dL (ref 65–99)
Potassium: 5.2 mmol/L — ABNORMAL HIGH (ref 3.5–5.1)
SODIUM: 133 mmol/L — AB (ref 135–145)
TOTAL PROTEIN: 7.1 g/dL (ref 6.5–8.1)

## 2014-12-15 LAB — I-STAT TROPONIN, ED
TROPONIN I, POC: 0 ng/mL (ref 0.00–0.08)
TROPONIN I, POC: 0.01 ng/mL (ref 0.00–0.08)

## 2014-12-15 LAB — URINALYSIS, ROUTINE W REFLEX MICROSCOPIC
GLUCOSE, UA: NEGATIVE mg/dL
HGB URINE DIPSTICK: NEGATIVE
KETONES UR: 15 mg/dL — AB
Nitrite: NEGATIVE
PH: 5.5 (ref 5.0–8.0)
Protein, ur: NEGATIVE mg/dL
Specific Gravity, Urine: 1.034 — ABNORMAL HIGH (ref 1.005–1.030)

## 2014-12-15 LAB — URINE MICROSCOPIC-ADD ON

## 2014-12-15 LAB — CBG MONITORING, ED: Glucose-Capillary: 79 mg/dL (ref 65–99)

## 2014-12-15 LAB — LIPASE, BLOOD: Lipase: 27 U/L (ref 11–51)

## 2014-12-15 MED ORDER — PROMETHAZINE HCL 25 MG PO TABS: 25.0000 mg | ORAL_TABLET | Freq: Four times a day (QID) | ORAL | Status: DC | PRN

## 2014-12-15 MED ORDER — ONDANSETRON HCL 4 MG/2ML IJ SOLN
4.0000 mg | Freq: Once | INTRAMUSCULAR | Status: AC
  Administered 2014-12-15: 4 mg via INTRAVENOUS
  Filled 2014-12-15: qty 2

## 2014-12-15 MED ORDER — SUCRALFATE 1 G PO TABS: 1.0000 g | ORAL_TABLET | Freq: Three times a day (TID) | ORAL | Status: DC

## 2014-12-15 MED ORDER — PANTOPRAZOLE SODIUM 40 MG PO TBEC: 40.0000 mg | DELAYED_RELEASE_TABLET | Freq: Every day | ORAL | Status: DC

## 2014-12-15 MED ORDER — SODIUM CHLORIDE 0.9 % IV BOLUS (SEPSIS)
1000.0000 mL | Freq: Once | INTRAVENOUS | Status: AC
Start: 2014-12-15 — End: 2014-12-15
  Administered 2014-12-15: 1000 mL via INTRAVENOUS

## 2014-12-15 MED ORDER — ALUM & MAG HYDROXIDE-SIMETH 200-200-20 MG/5ML PO SUSP
30.0000 mL | Freq: Once | ORAL | Status: AC
  Administered 2014-12-15: 30 mL via ORAL
  Filled 2014-12-15: qty 30

## 2014-12-15 MED ORDER — GI COCKTAIL ~~LOC~~
30.0000 mL | Freq: Once | ORAL | Status: AC
  Administered 2014-12-15: 30 mL via ORAL
  Filled 2014-12-15 (×2): qty 30

## 2014-12-15 MED ORDER — PANTOPRAZOLE SODIUM 40 MG IV SOLR
40.0000 mg | Freq: Once | INTRAVENOUS | Status: AC
  Administered 2014-12-15: 40 mg via INTRAVENOUS
  Filled 2014-12-15: qty 40

## 2014-12-15 NOTE — ED Notes (Signed)
Pt reports emesis while in XR. Says he "threw up all" the GI cocktail.

## 2014-12-15 NOTE — ED Provider Notes (Signed)
By signing my name below, I, Arlan Organ, attest that this documentation has been prepared under the direction and in the presence of Kristen N Ward, DO.  Electronically Signed: Arlan Organ, ED Scribe. 12/15/2014. 12:13 AM.   TIME SEEN: 12:11 AM   CHIEF COMPLAINT:  Chief Complaint  Patient presents with  . Chest Pain     HPI:  HPI Comments: Lonnie [name] is a 49 y.o. male with a PMHx of cocaine abuse, reported h/o HTN, gastritis, and DM who presents to the Emergency Department complaining of constant, ongoing L sided chest pain onset 1 hour prior to arrival after waking from sleep. Pain is described as tightness. Discomfort is made worse when flat and mildly alleviated when sitting up. No diaphoresis with the chest pain. No OTC medications attempted prior to arrival. However, 324 of ASA and 1 nitro given en route to department. Mr. Klinger also reports ongoing vomiting, loss of appetite, and diaphoresis x 3 days which he states is similar to previous gastritis flare.  Intermittent streaks of blood noted in vomit. No recent fever or chills.  Does report associated shortness of breath. He is an every day smoker. Mother was diagnosed with heart disease at the age of 30. Pt is not currently on any anticoagulants.  States he has had a sour taste in his mough and burning in his epigastric region.  He denies h/o stress test or cath.  Last used cocaine "months ago."  No h/o PE or DVT.    PCP: No PCP Per Patient    ROS: See HPI Constitutional: no fever. Positive diaphoresis and appetite change Eyes: no drainage  ENT: no runny nose   Cardiovascular:  Positive chest pain  Resp: no SOB  GI: Positive nausea, vomiting GU: no dysuria Integumentary: no rash  Allergy: no hives  Musculoskeletal: no leg swelling  Neurological: no slurred speech ROS otherwise negative  PAST MEDICAL HISTORY/PAST SURGICAL HISTORY:  Past Medical History  Diagnosis Date  . GERD (gastroesophageal reflux disease)   .  Depression   . Lumbago   . ETOH abuse   . Cocaine abuse   . Xanax use disorder, mild, abuse   . Marijuana abuse   . Schizophrenia (HCC)   . Hypertension     pt reports that he has hx  . CVA (cerebral vascular accident) (HCC) 07/2013    "weak on the right side since" (11/06/2014)  . Pneumonia 09/2008    [name] 10/28/2010  . Type II diabetes mellitus (HCC)   . Headache     "qod" (11/06/2014)  . Migraine     "2-3 days/wk" (11/06/2014)  . Degenerative disc disease, lumbar   . Arthritis     "knees" (11/06/2014)  . Chronic lower back pain   . Anxiety   . Bipolar disorder (HCC)     MEDICATIONS:  Prior to Admission medications   Medication Sig Start Date End Date Taking? Authorizing Provider  aspirin 81 MG chewable tablet Chew 1 tablet (81 mg total) by mouth daily. 11/07/14   Valentino Nose, MD  atorvastatin (LIPITOR) 40 MG tablet Take 1 tablet (40 mg total) by mouth daily at 6 PM. 11/07/14   Valentino Nose, MD  citalopram (CELEXA) 20 MG tablet Take 1 tablet (20 mg total) by mouth daily. 10/30/14   Adonis Brook, NP  ibuprofen (ADVIL,MOTRIN) 800 MG tablet Take 1 tablet (800 mg total) by mouth 3 (three) times daily. 11/23/14   Shawn C Joy, PA-C  ondansetron (ZOFRAN ODT) 4 MG disintegrating  tablet Take 1 tablet (4 mg total) by mouth every 8 (eight) hours as needed for nausea or vomiting. 11/23/14   Shawn C Joy, PA-C  pantoprazole (PROTONIX) 20 MG tablet Take 1 tablet (20 mg total) by mouth 2 (two) times daily. 11/27/14   Marily MemosJason Mesner, MD  risperiDONE (RISPERDAL) 2 MG tablet Take 2 mg by mouth at bedtime.    Historical Provider, MD    ALLERGIES:  Allergies  Allergen Reactions  . Morphine And Related Nausea And Vomiting    SOCIAL HISTORY:  Social History  Substance Use Topics  . Smoking status: Current Every Day Smoker -- 1.00 packs/day for 34 years    Types: Cigarettes  . Smokeless tobacco: Former NeurosurgeonUser    Types: Chew     Comment: "sopped chewing in ~ 2006"  . Alcohol Use: Yes      Comment: 11/06/2014 "3-4 40 oz per day; stopped ~ 3 months"    FAMILY HISTORY: Family History  Problem Relation Age of Onset  . Diabetes Mother   . Hypertension Mother   . Hyperlipidemia Father   . Heart attack Neg Hx   . Sudden death Neg Hx     EXAM: BP 128/79 mmHg  Pulse 97  Temp(Src) 98.2 F (36.8 C) (Oral)  Resp 19  Ht 5\' 9"  (1.753 m)  Wt 185 lb (83.915 kg)  BMI 27.31 kg/m2  SpO2 95% CONSTITUTIONAL: Alert and oriented and responds appropriately to questions. Well-appearing; well-nourished HEAD: Normocephalic EYES: Conjunctivae clear, PERRL ENT: normal nose; no rhinorrhea; dry mucous membranes; pharynx without lesions noted NECK: Supple, no meningismus, no LAD  CARD: RRR; S1 and S2 appreciated; no murmurs, no clicks, no rubs, no gallops RESP: Normal chest excursion without splinting or tachypnea; breath sounds clear and equal bilaterally; no wheezes, no rhonchi, no rales, no hypoxia or respiratory distress, speaking full sentences ABD/GI: Normal bowel sounds; non-distended; soft, tenderness to epigastric region, no rebound, no guarding, no peritoneal signs BACK:  The back appears normal and is non-tender, there is no CVA tenderness EXT: Normal ROM in all joints; non-tender to palpation; no edema; normal capillary refill; no cyanosis, no calf tenderness or swelling    SKIN: Normal color for age and race; warm NEURO: Moves all extremities equally, sensation to light touch intact diffusely, cranial nerves II through XII intact PSYCH: The patient's mood and manner are appropriate. Grooming and personal hygiene are appropriate.  MEDICAL DECISION MAKING: Patient here with atypical chest pain. He does have some risk factors for ACS but suspect this is gastritis, esophageal spasm. Does have a history of cocaine use. We'll obtain cardiac labs, LFTs and lipase, acute abdominal series, urine drug screen. We'll give IV fluids, GI cocktail, Protonix and Zofran. He has no risk factors for  pulmonary embolus. States his pain is gone currently.  ED PROGRESS: Patient's pain has been controlled after GI cocktail, Zofran, Protonix. He has had 2 negative troponins. LFTs, lipase normal. Creatinine mildly elevated but he has received 2 L of IV fluids. Urine drug screen is positive for cocaine, amphetamines and THC. Suspect that this is likely some of the reason he is having chest pain and vomiting. He has been able to eat and drink in the emergency department without further vomiting. Have advised him to avoid using illegal substances and follow up with her primary care provider. He is asymptomatic currently. Discussed return precautions. Will provide him with outpatient follow-up information. He verbalizes understanding and is comfortable with this plan.   EKG Interpretation  Date/Time:  Saturday December 14 2014 23:44:01 EST Ventricular Rate:  95 PR Interval:  123 QRS Duration: 70 QT Interval:  345 QTC Calculation: 434 R Axis:   72 Text Interpretation:  Sinus rhythm No significant change since last tracing Confirmed by WARD,  DO, KRISTEN [phone]) on 12/14/2014 11:52:35 PM        I personally performed the services described in this documentation, which was scribed in my presence. The recorded information has been reviewed and is accurate.   Layla Maw Ward, DO 12/15/14 [phone]

## 2014-12-15 NOTE — ED Notes (Signed)
Discussed d/c instructions with pt, who voiced understanding. Pt departed under his own power, with his aunt and in NAD.

## 2014-12-15 NOTE — Discharge Instructions (Signed)
Gastritis, Adult Gastritis is soreness and swelling (inflammation) of the lining of the stomach. Gastritis can develop as a sudden onset (acute) or long-term (chronic) condition. If gastritis is not treated, it can lead to stomach bleeding and ulcers. CAUSES  Gastritis occurs when the stomach lining is weak or damaged. Digestive juices from the stomach then inflame the weakened stomach lining. The stomach lining may be weak or damaged due to viral or bacterial infections. One common bacterial infection is the Helicobacter pylori infection. Gastritis can also result from excessive alcohol consumption, taking certain medicines, or having too much acid in the stomach.  SYMPTOMS  In some cases, there are no symptoms. When symptoms are present, they may include:  Pain or a burning sensation in the upper abdomen.  Nausea.  Vomiting.  An uncomfortable feeling of fullness after eating. DIAGNOSIS  Your caregiver may suspect you have gastritis based on your symptoms and a physical exam. To determine the cause of your gastritis, your caregiver may perform the following:  Blood or stool tests to check for the H pylori bacterium.  Gastroscopy. A thin, flexible tube (endoscope) is passed down the esophagus and into the stomach. The endoscope has a light and camera on the end. Your caregiver uses the endoscope to view the inside of the stomach.  Taking a tissue sample (biopsy) from the stomach to examine under a microscope. TREATMENT  Depending on the cause of your gastritis, medicines may be prescribed. If you have a bacterial infection, such as an H pylori infection, antibiotics may be given. If your gastritis is caused by too much acid in the stomach, H2 blockers or antacids may be given. Your caregiver may recommend that you stop taking aspirin, ibuprofen, or other nonsteroidal anti-inflammatory drugs (NSAIDs). HOME CARE INSTRUCTIONS  Only take over-the-counter or prescription medicines as directed by  your caregiver.  If you were given antibiotic medicines, take them as directed. Finish them even if you start to feel better.  Drink enough fluids to keep your urine clear or pale yellow.  Avoid foods and drinks that make your symptoms worse, such as:  Caffeine or alcoholic drinks.  Chocolate.  Peppermint or mint flavorings.  Garlic and onions.  Spicy foods.  Citrus fruits, such as oranges, lemons, or limes.  Tomato-based foods such as sauce, chili, salsa, and pizza.  Fried and fatty foods.  Eat small, frequent meals instead of large meals. SEEK IMMEDIATE MEDICAL CARE IF:   You have black or dark red stools.  You vomit blood or material that looks like coffee grounds.  You are unable to keep fluids down.  Your abdominal pain gets worse.  You have a fever.  You do not feel better after 1 week.  You have any other questions or concerns. MAKE SURE YOU:  Understand these instructions.  Will watch your condition.  Will get help right away if you are not doing well or get worse.   This information is not intended to replace advice given to you by your health care provider. Make sure you discuss any questions you have with your health care provider.   Document Released: 01/05/2001 Document Revised: 07/13/2011 Document Reviewed: 02/24/2011 Elsevier Interactive Patient Education 2016 [employer]. Food Choices for Gastroesophageal Reflux Disease, Adult When you have gastroesophageal reflux disease (GERD), the foods you eat and your eating habits are very important. Choosing the right foods can help ease the discomfort of GERD. WHAT GENERAL GUIDELINES DO I NEED TO FOLLOW?  Choose fruits, vegetables, whole grains,  low-fat dairy products, and low-fat meat, fish, and poultry.  Limit fats such as oils, salad dressings, butter, nuts, and avocado.  Keep a food diary to identify foods that cause symptoms.  Avoid foods that cause reflux. These may be different for  different people.  Eat frequent small meals instead of three large meals each day.  Eat your meals slowly, in a relaxed setting.  Limit fried foods.  Cook foods using methods other than frying.  Avoid drinking alcohol.  Avoid drinking large amounts of liquids with your meals.  Avoid bending over or lying down until 2-3 hours after eating. WHAT FOODS ARE NOT RECOMMENDED? The following are some foods and drinks that may worsen your symptoms: Vegetables Tomatoes. Tomato juice. Tomato and spaghetti sauce. Chili peppers. Onion and garlic. Horseradish. Fruits Oranges, grapefruit, and lemon (fruit and juice). Meats High-fat meats, fish, and poultry. This includes hot dogs, ribs, ham, sausage, salami, and bacon. Dairy Whole milk and chocolate milk. Sour cream. Cream. Butter. Ice cream. Cream cheese.  Beverages Coffee and tea, with or without caffeine. Carbonated beverages or energy drinks. Condiments Hot sauce. Barbecue sauce.  Sweets/Desserts Chocolate and cocoa. Donuts. Peppermint and spearmint. Fats and Oils High-fat foods, including [place] fries and potato chips. Other Vinegar. Strong spices, such as black pepper, white pepper, red pepper, cayenne, curry powder, cloves, ginger, and chili powder. The items listed above may not be a complete list of foods and beverages to avoid. Contact your dietitian for more information.   This information is not intended to replace advice given to you by your health care provider. Make sure you discuss any questions you have with your health care provider.   Document Released: 01/11/2005 Document Revised: 02/01/2014 Document Reviewed: 11/15/2012 Elsevier Interactive Patient Education 2016 Elsevier Inc.  Stimulant Use Disorder-Cocaine Cocaine is one of a group of powerful drugs called stimulants. Cocaine has medical uses for stopping nosebleeds and for pain control before minor nose or dental surgery. However, cocaine is misused because of the  effects that it produces. These effects include:   A feeling of extreme pleasure.  Alertness.  High energy. Common street names for cocaine include coke, crack, blow, snow, and nose candy. Cocaine is snorted, dissolved in water and injected, or smoked.  Stimulants are addictive because they activate regions of the brain that produce both the pleasurable sensation of "reward" and psychological dependence. Together, these actions account for loss of control and the rapid development of drug dependence. This means you become ill without the drug (withdrawal) and need to keep using it to function.  Stimulant use disorder is use of stimulants that disrupts your daily life. It disrupts relationships with family and friends and how you do your job. Cocaine increases your blood pressure and heart rate. It can cause a heart attack or stroke. Cocaine can also cause death from irregular heart rate or seizures. SYMPTOMS Symptoms of stimulant use disorder with cocaine include:  Use of cocaine in larger amounts or over a longer period of time than intended.  Unsuccessful attempts to cut down or control cocaine use.  A lot of time spent obtaining, using, or recovering from the effects of cocaine.  A strong desire or urge to use cocaine (craving).  Continued use of cocaine in spite of major problems at work, school, or home because of use.  Continued use of cocaine in spite of relationship problems because of use.  Giving up or cutting down on important life activities because of cocaine use.  Use  of cocaine over and over in situations when it is physically hazardous, such as driving a car.  Continued use of cocaine in spite of a physical problem that is likely related to use. Physical problems can include:  Malnutrition.  Nosebleeds.  Chest pain.  High blood pressure.  A hole that develops between the part of your nose that separates your nostrils (perforated nasal septum).  Lung and kidney  damage.  Continued use of cocaine in spite of a mental problem that is likely related to use. Mental problems can include:  Schizophrenia-like symptoms.  Depression.  Bipolar mood swings.  Anxiety.  Sleep problems.  Need to use more and more cocaine to get the same effect, or lessened effect over time with use of the same amount of cocaine (tolerance).  Having withdrawal symptoms when cocaine use is stopped, or using cocaine to reduce or avoid withdrawal symptoms. Withdrawal symptoms include:  Depressed or irritable mood.  Low energy or restlessness.  Bad dreams.  Poor or excessive sleep.  Increased appetite. DIAGNOSIS Stimulant use disorder is diagnosed by your health care provider. You may be asked questions about your cocaine use and how it affects your life. A physical exam may be done. A drug screen may be ordered. You may be referred to a mental health professional. The diagnosis of stimulant use disorder requires at least two symptoms within 12 months. The type of stimulant use disorder depends on the number of signs and symptoms you have. The type may be:  Mild. Two or three signs and symptoms.  Moderate. Four or five signs and symptoms.  Severe. Six or more signs and symptoms. TREATMENT Treatment for stimulant use disorder is usually provided by mental health professionals with training in substance use disorders. The following options are available:  Counseling or talk therapy. Talk therapy addresses the reasons you use cocaine and ways to keep you from using again. Goals of talk therapy include:  Identifying and avoiding triggers for use.  Handling cravings.  Replacing use with healthy activities.  Support groups. Support groups provide emotional support, advice, and guidance.  Medicine. Certain medicines may decrease cocaine cravings or withdrawal symptoms. HOME CARE INSTRUCTIONS  Take medicines only as directed by your health care provider.  Identify  the people and activities that trigger your cocaine use and avoid them.  Keep all follow-up visits as directed by your health care provider. SEEK MEDICAL CARE IF:  Your symptoms get worse or you relapse.  You are not able to take medicines as directed. SEEK IMMEDIATE MEDICAL CARE IF:  You have serious thoughts about hurting yourself or others.  You have a seizure, chest pain, sudden weakness, or loss of speech or vision. FOR MORE INFORMATION  National Institute on Drug Abuse: http://www.price-smith.com/  Substance Abuse and Mental Health Services Administration: SkateOasis.com.pt   This information is not intended to replace advice given to you by your health care provider. Make sure you discuss any questions you have with your health care provider.   Document Released: 01/09/2000 Document Revised: 02/01/2014 Document Reviewed: 01/24/2013 Elsevier Interactive Patient Education 2016 [employer].   Emergency Department Resource Guide 1) Find a Doctor and Pay Out of Pocket Although you won't have to find out who is covered by your insurance plan, it is a good idea to ask around and get recommendations. You will then need to call the office and see if the doctor you have chosen will accept you as a new patient and what types of options they  offer for patients who are self-pay. Some doctors offer discounts or will set up payment plans for their patients who do not have insurance, but you will need to ask so you aren't surprised when you get to your appointment.  2) Contact Your Local Health Department Not all health departments have doctors that can see patients for sick visits, but many do, so it is worth a call to see if yours does. If you don't know where your local health department is, you can check in your phone book. The CDC also has a tool to help you locate your state's health department, and many state websites also have listings of all of their local health departments.  3) Find a Walk-in  Clinic If your illness is not likely to be very severe or complicated, you may want to try a walk in clinic. These are popping up all over the country in pharmacies, drugstores, and shopping centers. They're usually staffed by nurse practitioners or physician assistants that have been trained to treat common illnesses and complaints. They're usually fairly quick and inexpensive. However, if you have serious medical issues or chronic medical problems, these are probably not your best option.  No Primary Care Doctor: - Call Health Connect at  [phone] - they can help you locate a primary care doctor that  accepts your insurance, provides certain services, etc. - Physician Referral Service- [phone]  Chronic Pain Problems: Organization         Address  Phone   Notes  [facility]  [phone] Patients need to be referred by their primary care doctor.   Medication Assistance: Organization         Address  Phone   Notes  [facility] [address] [place], [place] [address] [phone] --Must be a resident of [facility] [place] -- Must have NO insurance coverage whatsoever (no Medicaid/ Medicare, etc.) -- The pt. MUST have a primary care doctor that directs their care regularly and follows them in the community   MedAssist  [phone]   [employer]  [phone]    Agencies that provide inexpensive medical care: Organization         Address  Phone   Notes  [facility]  [phone]   [facility]    [phone]   [facility] [address] [place], [place] [address] [phone]   [facility] [address], [place] [phone]   [facility]anned Parenthood    [phone]   [facility]    [phone]   [facility]  [address], [place] Phone:  [phone], Fax:  [phone] Hours  of Operation:  9 am - 6 pm, M-F.  Also accepts Medicaid/Medicare and self-pay.  [facility]  [address], [place] Phone: [phone], Fax: [phone]. Hours of Operation:  8:30 am - 5:30 pm, M-F.  Also accepts Medicaid and self-pay.  [facility] [place] [address], [place] Phone: [phone]   [facility] [address], [place], [place] [phone], Ext. [phone] Mondays & Thursdays: 7-9 AM.  First 15 patients are seen on a first come, first serve basis.    Medicaid-accepting [facility] Providers:  Organization         Address  Phone   Notes  [facility] [address], [place] [phone] Also accepts self-pay patients.  [facility] [address] Laurell Josephs [place], [place]  [phone]   [facility] [address], [place] [phone]   [facility] [address], [place] [phone]   Renaye Rakers [address], [place]   [phone] Only accepts [place] Access [place] patients after they have their name applied to their card.   Self-Pay (no insurance) in [facility]:  Organization         Address  Phone   Notes  Sickle Cell Patients, [facility] [address] [place], [place] [phone]   [facility] [address] [place], [place] [phone]   [facility] [place]  [address], [place] [phone]   [facility]/Dr. Osei-Bonsu  [address] [place] or [address], [place] [phone] Phone number for both [place] and [place] locations is the same.  [facility] [address] [place] [phone]   [facility] [address] [place] [address] [place] or [address] [phone]   [facility] [address] [place] [phone], phone; [phone], fax Sees patients 1st and 3rd Saturday of every month.  Must not qualify for public or private insurance (i.e. Medicaid, Medicare, [place] Health Choice, Veterans' Benefits)  Household income should be no more than 200% of the poverty level The clinic cannot treat you if you are pregnant or think you are pregnant  Sexually transmitted diseases are not treated at the clinic.    Dental Care: Organization         Address  Phone  Notes  [facility] Department of [facility] [address] [place], [place] [phone] Accepts children up to age 84 who are enrolled in [place] or [place] Health Choice; pregnant women with a Medicaid card; and children who have applied for Medicaid or [place] Health Choice, but were declined, whose parents can pay a reduced fee at time of service.  [facility] Department of [facility]  [address], [place] [phone] Accepts children up to age 30 who are enrolled in [place] or [place] Health Choice; pregnant women with a Medicaid card; and children who have applied for Medicaid or [place] Health Choice, but were declined, whose parents can pay a reduced fee at time of service.  [facility]  [address] [place], [place] [phone] Patients are seen by appointment only. Walk-ins are not accepted. [facility] will see patients 33 years of age and older. Monday - Tuesday (8am-5pm) Most Wednesdays (8:30-5pm) $30 per visit, cash only  [facility] Access PRO[facility]  [address], [facility] [phone] Patients are seen by appointment only. Walk-ins are not accepted. [facility] will see patients 59 years of age and older. One Wednesday Evening (Monthly: Volunteer Based).  $30 per visit, cash only  [employer]  [phone] for adults; Children under age 12, call Graduate  Pediatric Dentistry at [phone]. Children aged 105-14, please call [phone] to request a pediatric application.  Dental services are provided in all areas of dental care including fillings, crowns and bridges, complete and partial dentures, implants,  gum treatment, root canals, and extractions. Preventive care is also provided. Treatment is provided to both adults and children. Patients are selected via a lottery and there is often a waiting list.   [facility] [address] [place]  [phone] [email or web address]   [facility] [address] [place], [place] [phone], Ext. [phone] Second and Fourth Thursday of each month, opens at 6:30 AM; Clinic ends at 9 AM.  Patients are seen on a first-come first-served basis, and a limited number are seen during each clinic.   [facility]  [address] Ether Griffins [place], [place] [phone]   Eligibility Requirements You must have lived in [place], [place], or [place] counties for at least the last three months.   You cannot be eligible for state or federal sponsored [place], including [employer], [place], or [employer].   You generally cannot be eligible for healthcare insurance through your employer.    How to apply: Eligibility screenings are held every Tuesday and Wednesday afternoon from 1:00 pm until 4:00 pm. You do not need an appointment for the interview!  [facility] [address] [place], [place] [phone]   [facility] Health Department  [phone]   [facility]  [phone]   [facility] Health Department  [phone]    Behavioral Health Resources in the Community: Intensive Outpatient Programs Organization         Address  Phone  Notes  [facility] [address], [place], [place] [phone]   [facility] Outpatient [address] [place], [place]  [phone]   [facility] [address], [place], [place]  [phone]   [facility] [address],  [place], [place] [phone] or [phone]   Substance Abuse Resources Organization         Address  Phone  Notes  [facility] Drug Services  [phone]   [facility]  [phone]   [facility] [place]  [phone]   Floydene Flock  [phone]   Residential & Outpatient Substance Abuse Program  [phone]   Psychological Services Organization         Address  Phone  Notes  [facility]  [phone]   [facility] Services  [phone]   [facility] Mental Health [address], [place] [phone] or [phone]    Mobile Crisis Teams Organization         Address  Phone  Notes  [facility], Mobile Crisis Care Unit  [phone]   [facility]  [address] [place], [place] [phone]   Doristine Locks [address] [place] [phone]    Self-Help/Support Groups Organization         Address  Phone             Notes  [facility] [place] - variety of support groups  [phone]- [phone] Call for more information  Narcotics Anonymous (NA), [facility] [address], [employer] [place]  2 meetings at this location   Statistician         Address  Phone  Notes  [facility] [address],    [place]  [phone]   [facility]  [address], [place] [address], [place], [place] [phone]   [facility] Treatment [facility] [address] [place], [place] [phone] Admissions: 8am-3pm M-F  [facility] [address],    [place], [place] [phone]   [facility] [address] [place], [place], [place] [phone]   [facility] [address],  [place], [place] [phone]   [facility] - Intensive Outpatient [address]ure[address], [place], [place] [phone]   [facility] ([facility] [address] [place]  [place], [place] [phone] or [phone]   [facility] ([facility]) [address] [place], [place] [phone] Accepts Medicaid  [facility] [address],  [place] [phone] Substance Abuse/Addiction Treatment   [facility] Organization         Address  Phone  Notes  [facility]  [phone]   Angie Fava, PhD [address] Ervin Knack [place], [place]   [phone] or [phone]   [facility]   [address] [place], [place] [phone]   [facility] [address] [place], [place] [phone] Insurance/Medicaid/sponsorship through [facility] and Families [address]                                    [place], [place] [phone] Therapy/tele-psych/case  [facility] [address], [place] [phone]    Dr. Lolly Mustache  [phone]   [facility] [place]  [employer] [facility] Dept. 1) [phone] S. [address] [place] 2) [address] [place] 3)  [address], [place] [phone]  [phone]   [facility] Child Abuse Hotline [phone] or [phone] (After Hours)

## 2015-04-02 ENCOUNTER — Encounter (HOSPITAL_COMMUNITY): Payer: Self-pay | Admitting: Family Medicine

## 2015-04-02 ENCOUNTER — Emergency Department (HOSPITAL_COMMUNITY)
Admission: EM | Admit: 2015-04-02 | Discharge: 2015-04-08 | Disposition: A | Discharge status: Other Acute Inpatient Hospital | Payer: Federal, State, Local not specified - Other | Attending: Emergency Medicine | Admitting: Emergency Medicine

## 2015-04-02 DIAGNOSIS — K219 Gastro-esophageal reflux disease without esophagitis: Secondary | ICD-10-CM | POA: Insufficient documentation

## 2015-04-02 DIAGNOSIS — G8929 Other chronic pain: Secondary | ICD-10-CM | POA: Insufficient documentation

## 2015-04-02 DIAGNOSIS — Z8673 Personal history of transient ischemic attack (TIA), and cerebral infarction without residual deficits: Secondary | ICD-10-CM | POA: Insufficient documentation

## 2015-04-02 DIAGNOSIS — F17210 Nicotine dependence, cigarettes, uncomplicated: Secondary | ICD-10-CM | POA: Insufficient documentation

## 2015-04-02 DIAGNOSIS — I10 Essential (primary) hypertension: Secondary | ICD-10-CM | POA: Insufficient documentation

## 2015-04-02 DIAGNOSIS — F1420 Cocaine dependence, uncomplicated: Secondary | ICD-10-CM | POA: Diagnosis present

## 2015-04-02 DIAGNOSIS — F259 Schizoaffective disorder, unspecified: Secondary | ICD-10-CM | POA: Diagnosis present

## 2015-04-02 DIAGNOSIS — R45851 Suicidal ideations: Secondary | ICD-10-CM

## 2015-04-02 DIAGNOSIS — Z7982 Long term (current) use of aspirin: Secondary | ICD-10-CM | POA: Insufficient documentation

## 2015-04-02 DIAGNOSIS — Z8739 Personal history of other diseases of the musculoskeletal system and connective tissue: Secondary | ICD-10-CM | POA: Insufficient documentation

## 2015-04-02 DIAGNOSIS — M170 Bilateral primary osteoarthritis of knee: Secondary | ICD-10-CM | POA: Insufficient documentation

## 2015-04-02 DIAGNOSIS — Z79899 Other long term (current) drug therapy: Secondary | ICD-10-CM | POA: Insufficient documentation

## 2015-04-02 DIAGNOSIS — R45850 Homicidal ideations: Secondary | ICD-10-CM | POA: Insufficient documentation

## 2015-04-02 DIAGNOSIS — R451 Restlessness and agitation: Secondary | ICD-10-CM | POA: Insufficient documentation

## 2015-04-02 DIAGNOSIS — E119 Type 2 diabetes mellitus without complications: Secondary | ICD-10-CM | POA: Insufficient documentation

## 2015-04-02 DIAGNOSIS — Z8701 Personal history of pneumonia (recurrent): Secondary | ICD-10-CM | POA: Insufficient documentation

## 2015-04-02 LAB — RAPID URINE DRUG SCREEN, HOSP PERFORMED
AMPHETAMINES: NOT DETECTED
Barbiturates: NOT DETECTED
Benzodiazepines: NOT DETECTED
COCAINE: POSITIVE — AB
OPIATES: NOT DETECTED
Tetrahydrocannabinol: NOT DETECTED

## 2015-04-02 LAB — COMPREHENSIVE METABOLIC PANEL
ALT: 27 U/L (ref 17–63)
ANION GAP: 10 (ref 5–15)
AST: 37 U/L (ref 15–41)
Albumin: 3.4 g/dL — ABNORMAL LOW (ref 3.5–5.0)
Alkaline Phosphatase: 55 U/L (ref 38–126)
BUN: 12 mg/dL (ref 6–20)
CHLORIDE: 105 mmol/L (ref 101–111)
CO2: 24 mmol/L (ref 22–32)
CREATININE: 1.35 mg/dL — AB (ref 0.61–1.24)
Calcium: 9.1 mg/dL (ref 8.9–10.3)
Glucose, Bld: 65 mg/dL (ref 65–99)
POTASSIUM: 4.2 mmol/L (ref 3.5–5.1)
SODIUM: 139 mmol/L (ref 135–145)
Total Bilirubin: 1.2 mg/dL (ref 0.3–1.2)
Total Protein: 6.4 g/dL — ABNORMAL LOW (ref 6.5–8.1)

## 2015-04-02 LAB — CBC
HCT: 41.6 % (ref 39.0–52.0)
Hemoglobin: 14 g/dL (ref 13.0–17.0)
MCH: 28.1 pg (ref 26.0–34.0)
MCHC: 33.7 g/dL (ref 30.0–36.0)
MCV: 83.4 fL (ref 78.0–100.0)
PLATELETS: 364 10*3/uL (ref 150–400)
RBC: 4.99 MIL/uL (ref 4.22–5.81)
RDW: 14.7 % (ref 11.5–15.5)
WBC: 8.1 10*3/uL (ref 4.0–10.5)

## 2015-04-02 LAB — SALICYLATE LEVEL

## 2015-04-02 LAB — ACETAMINOPHEN LEVEL: Acetaminophen (Tylenol), Serum: <10 ug/mL — ABNORMAL LOW (ref 10–30)

## 2015-04-02 LAB — ETHANOL

## 2015-04-02 MED ORDER — PANTOPRAZOLE SODIUM 40 MG PO TBEC
40.0000 mg | DELAYED_RELEASE_TABLET | Freq: Every day | ORAL | Status: DC
  Administered 2015-04-03 – 2015-04-08 (×6): 40 mg via ORAL
  Filled 2015-04-02 (×6): qty 1

## 2015-04-02 MED ORDER — ACETAMINOPHEN 325 MG PO TABS
650.0000 mg | ORAL_TABLET | Freq: Four times a day (QID) | ORAL | Status: DC | PRN
  Administered 2015-04-02 – 2015-04-08 (×12): 650 mg via ORAL
  Filled 2015-04-02 (×13): qty 2

## 2015-04-02 MED ORDER — SUCRALFATE 1 G PO TABS
1.0000 g | ORAL_TABLET | Freq: Three times a day (TID) | ORAL | Status: DC
  Administered 2015-04-03 – 2015-04-08 (×20): 1 g via ORAL
  Filled 2015-04-02 (×20): qty 1

## 2015-04-02 MED ORDER — ASPIRIN 81 MG PO CHEW
81.0000 mg | CHEWABLE_TABLET | Freq: Every day | ORAL | Status: DC
  Administered 2015-04-03 – 2015-04-08 (×6): 81 mg via ORAL
  Filled 2015-04-02 (×6): qty 1

## 2015-04-02 NOTE — ED Notes (Signed)
Pt requesting more food, informed pt that we have designated snack hours & the next meal would be in the morning for breakfast.
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
## 2015-04-02 NOTE — ED Notes (Signed)
Pt. wanded by security at triage .  

## 2015-04-02 NOTE — ED Notes (Signed)
Pt here for suicidal thoughts and hearing voices. Pt talking to someone in triage saying "NO".  Pt seems agitated asking what the quickest way to die is.
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
## 2015-04-02 NOTE — ED Notes (Signed)
TTS at bedside. 

## 2015-04-02 NOTE — ED Notes (Signed)
TTS at bedside, able to see pt from camera. Pt is becoming very agitated during assessment

## 2015-04-03 MED ORDER — LORAZEPAM 1 MG PO TABS
2.0000 mg | ORAL_TABLET | Freq: Once | ORAL | Status: AC
  Administered 2015-04-03: 2 mg via ORAL
  Filled 2015-04-03: qty 2

## 2015-04-03 NOTE — ED Notes (Signed)
Pt woke up cursing and yelling stating, "Why the fuck am I just sitting in the hospital, this is bullshit. I can do this shit at my house. I want to know where the fucking doctor is to talk to me and figure out what is going on in my brain".

## 2015-04-03 NOTE — ED Notes (Signed)
Pt is sleeping

## 2015-04-03 NOTE — ED Notes (Signed)
Patient was given a sprite with breakfast.

## 2015-04-03 NOTE — ED Notes (Signed)
Pt making phone call. 

## 2015-04-03 NOTE — Progress Notes (Signed)
Per FNP Renata Capriceonrad and Dr. Lucianne MussKumar, patient meets criteria for psychiatric inpatient treatment.  Patient has been referred to: [facility]. [facility] - per Lupita Leashonna, accepting referrals. [facility] - per August Saucerean, will look at it. [facility] - per Joyce GrossKay, send referral for d/c tomorrow on 3/10. [facility] - [facility].  At capacity: [facility] - per Bennett ScrapeAshley [facility] - [facility], [facility] beds [facility] - per [facility]  CSW will continue to follow up with placement efforts.  Lonnie [name], LCSWA Disposition staff 04/03/2015 4:48 PM
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
## 2015-04-03 NOTE — ED Notes (Signed)
Patient was given a snack and drink. A regular diet ordered for lunch. 

## 2015-04-03 NOTE — ED Notes (Signed)
Patient having TTS at this time. 

## 2015-04-04 LAB — CBG MONITORING, ED: Glucose-Capillary: 141 mg/dL — ABNORMAL HIGH (ref 65–99)

## 2015-04-04 MED ORDER — LORAZEPAM 1 MG PO TABS
2.0000 mg | ORAL_TABLET | Freq: Four times a day (QID) | ORAL | Status: DC | PRN
  Administered 2015-04-04 – 2015-04-08 (×6): 2 mg via ORAL
  Filled 2015-04-04 (×6): qty 2

## 2015-04-04 MED ORDER — TRAZODONE HCL 100 MG PO TABS
100.0000 mg | ORAL_TABLET | Freq: Every day | ORAL | Status: DC
  Administered 2015-04-05 – 2015-04-07 (×4): 100 mg via ORAL
  Filled 2015-04-04 (×4): qty 1

## 2015-04-04 MED ORDER — FLUOXETINE HCL 20 MG PO CAPS
20.0000 mg | ORAL_CAPSULE | Freq: Every day | ORAL | Status: DC
  Administered 2015-04-05 – 2015-04-08 (×4): 20 mg via ORAL
  Filled 2015-04-04 (×5): qty 1

## 2015-04-04 MED ORDER — OMEPRAZOLE MAGNESIUM 20 MG PO TBEC: 20.0000 mg | DELAYED_RELEASE_TABLET | Freq: Every day | ORAL | Status: DC

## 2015-04-04 MED ORDER — RISPERIDONE 0.5 MG PO TABS
2.0000 mg | ORAL_TABLET | Freq: Two times a day (BID) | ORAL | Status: DC
  Administered 2015-04-05 – 2015-04-08 (×8): 2 mg via ORAL
  Filled 2015-04-04 (×9): qty 4

## 2015-04-04 MED ORDER — BENZTROPINE MESYLATE 1 MG PO TABS
1.0000 mg | ORAL_TABLET | Freq: Every day | ORAL | Status: DC
  Administered 2015-04-05 – 2015-04-08 (×4): 1 mg via ORAL
  Filled 2015-04-04 (×4): qty 1

## 2015-04-04 MED ORDER — DIVALPROEX SODIUM 250 MG PO DR TAB
500.0000 mg | DELAYED_RELEASE_TABLET | Freq: Two times a day (BID) | ORAL | Status: DC
  Administered 2015-04-05 – 2015-04-08 (×8): 500 mg via ORAL
  Filled 2015-04-04 (×8): qty 2

## 2015-04-04 NOTE — ED Notes (Signed)
Pt ate 100% of dinner and has repeatedly requested a [place]key sandwich meal.  Pt's RN told pt's aunt that he had dinner just over 2 hrs ago and it is time for snack, not dinner. The sitter repeated the pt's options.This EMT reinforced the same message and provided graham crackers, peanut butter, and a drink.  Pt immediately ate his snack.

## 2015-04-04 NOTE — ED Notes (Signed)
A snack and drink given for a snack, and A regular diet ordered for lunch. 

## 2015-04-04 NOTE — ED Provider Notes (Signed)
Patient is intermittently agitated. Place on prn ativan until psych makes formal medication recommendations.  Pricilla LovelessScott Cora Brierley, MD 04/04/15 [phone]

## 2015-04-04 NOTE — ED Notes (Signed)
Pt up to use 2nd phone call of the day. States will shower afterwards.

## 2015-04-04 NOTE — ED Notes (Signed)
Pt out to desk reporting feeling anxious saying "I feel like I'm gonna pop off".  Explained that it is not time for his ativan at this time.

## 2015-04-04 NOTE — ED Notes (Signed)
Pt feeling increased anxiety, voice sounding more agitated.

## 2015-04-04 NOTE — Progress Notes (Signed)
Pt [facility]- per Odette Hornsebora [facility]- [facility]  [facility] with [facility].  [facility]-advised call back this evening to check if beds have become available and can refer if so  Declined: [facility]- per Kathlene NovemberMike, facility does not have contract with [facility]- due to [facility]- due to [facility]- per [place], no [facility] until July 2017  [facility], [facility], [facility], [facility] at capacity.  Ilean SkillMeghan Velena Keegan, MSW, LCSW Clinical Social Work, Disposition  04/04/2015 [phone]

## 2015-04-04 NOTE — ED Notes (Addendum)
Pt pacing and appearing more agitated, verbalizing anxiety and agitation.  Dr. Radford PaxBeaton aware and states to give PRN ativan dose at this time. Ice pack given to patient per his request for "toothache".

## 2015-04-04 NOTE — ED Notes (Signed)
Pt up to use phone.

## 2015-04-05 NOTE — ED Notes (Signed)
Pt aware his "wife" called back and advised she is still in [place]. Pt states he may want to leave then states he has changed his mind and may want to stay. Pt aware to notify RN when makes a decision and [facility] will be contacted for telepsych. Pt advised will shower at this time.

## 2015-04-05 NOTE — ED Notes (Signed)
Pt given ice water.

## 2015-04-05 NOTE — ED Notes (Signed)
Asked pt about his "spouse" - states he is not married but "she's like my wife - her name is Stage managerDawn [name]". Pt asked for RN to call her back and advise her of his condition and tx plan and that he will see her tomorrow. Attempted to call - no answer and unable to leave VM.

## 2015-04-05 NOTE — ED Notes (Signed)
Pt ambulated to and from restroom with sitter.   

## 2015-04-05 NOTE — ED Notes (Signed)
Pt woke to take Carafate - took med w/o difficulty then returned to sleeping.

## 2015-04-05 NOTE — ED Notes (Signed)
Dawn Pearson returned call and requested for pt to be advised she has his son w/her and they are OK. States they are still in [place] and will be looking forward to hearing from him tomorrow. Pt sleeping at this time. Will advise when awakens.

## 2015-04-05 NOTE — ED Notes (Addendum)
A male called who identified herself as pt's spouse - advised she just returned home from [place] and was advised pt is in ED. Advised her pt is sleeping at this time and has used his 2 phone calls for the day and that RN will advise him of her calling. Also advised if pt gives permission for RN to advise her of pt's status, RN will call her back. [phone].

## 2015-04-05 NOTE — ED Notes (Addendum)
Pt on phone at nurses' desk - making 2nd phone call. Voiced understanding of limit of 2 phone calls. During first phone call, pt was advising he would finish some work for them on Monday. During 2nd phone call to a male, pt asking if she would come visit today and bring him some clothes "because I'm about to get up out of here tomorrow."

## 2015-04-05 NOTE — ED Notes (Signed)
Pt called RN to room. States he feels he is "ready to go". Asked pt if he continues to feel SI or HI. Denies HI - however hesitates prior to answering if SI then states "I may better wait on that one." States AH are continuous. Advised pt of process if he decides he no longer feels SI/HI and wants to be d/c'd. Voiced understanding and states "I will wait until tomorrow."

## 2015-04-05 NOTE — ED Notes (Signed)
Pt woke to voice and took meds w/o difficulty. Pt given snack as requested w/Sprite. Pt turned head as if experiencing VH and AH - stated "No, Lonnie [name], you cannot have any of my food." Pt then ate snack w/o difficulty.

## 2015-04-05 NOTE — ED Notes (Addendum)
Pt's aunt brought pt a bag of belongings - security placed at nurses' desk - RN labeled. To be inventoried.

## 2015-04-06 NOTE — ED Notes (Signed)
Pt's aunt arrived to visit w/pt - no visitor sticker and she has her purse. States she did not stop at the front desk. Advised her of appropriate procedure. Security arrived to wand pt and assist her w/securing her purse and also advised her of procedure.

## 2015-04-06 NOTE — ED Notes (Signed)
Simonne ComeLeo, SW, [facility] - aware pt in agreement to be assessed [facility].

## 2015-04-06 NOTE — ED Notes (Signed)
Pt's aunt leaving at this time.

## 2015-04-06 NOTE — ED Provider Notes (Signed)
Resting in bed. Alert. Appears comfortable. Denies complaint  Doug SouSam Nishtha Raider, MD 04/06/15 [phone]

## 2015-04-06 NOTE — ED Notes (Signed)
Pt aware, per Simonne ComeLeo, [facility] at this time - may have beds after 1900 this evening.

## 2015-04-06 NOTE — ED Notes (Signed)
Pt ambulatory to nurses' desk asking if he may make another phone call - advised pt no d/t he has used all of his phone calls for the day. Voiced understanding and returned to room w/sitter.

## 2015-04-06 NOTE — ED Notes (Signed)
Pt on phone at nurses' desk. Breakfast tray delivered to pt.

## 2015-04-06 NOTE — ED Notes (Signed)
Pt aware of delay w/breakfast - given sandwich, Sprite and decaf coffee as requested.

## 2015-04-06 NOTE — ED Notes (Addendum)
Pt ambulatory to nurses' desk asking if his aunt brought his clothing. Showed pt his bag - pt looked at each item - shirt, pants, underwear, black high top tennis shoes and electric razor. Pt asked if he may use razor, advised him no. Voiced understanding. Pt asking about placement - states d/t taking so long [facility]. States "I just need to see a psychiatrist or someone I can talk to." Pt returned to room.

## 2015-04-06 NOTE — ED Notes (Signed)
Dr Jacubowitz in w/pt. 

## 2015-04-07 DIAGNOSIS — R45851 Suicidal ideations: Secondary | ICD-10-CM | POA: Diagnosis not present

## 2015-04-07 DIAGNOSIS — F259 Schizoaffective disorder, unspecified: Secondary | ICD-10-CM | POA: Diagnosis not present

## 2015-04-07 MED ORDER — PENICILLIN V POTASSIUM 250 MG PO TABS
500.0000 mg | ORAL_TABLET | Freq: Four times a day (QID) | ORAL | Status: DC
  Administered 2015-04-07 – 2015-04-08 (×2): 500 mg via ORAL
  Filled 2015-04-07 (×2): qty 2

## 2015-04-07 MED ORDER — HYDROCODONE-ACETAMINOPHEN 5-325 MG PO TABS
1.0000 | ORAL_TABLET | Freq: Once | ORAL | Status: AC
  Administered 2015-04-07: 1 via ORAL
  Filled 2015-04-07: qty 1

## 2015-04-07 NOTE — ED Notes (Signed)
Pt called family after being reassess by TTS.  Calm, pleasant, and cooperative.

## 2015-04-07 NOTE — ED Provider Notes (Signed)
Resting in bed. Alert. Appears comfortable. Denies any complaints  Doug SouSam Leovanni Bjorkman, MD 04/07/15 [phone]

## 2015-04-07 NOTE — ED Notes (Signed)
Notified Dr Fayrene FearingJames of pts dental pain causing a worsening headache and left eye vision changes with dizziness.

## 2015-04-07 NOTE — ED Notes (Signed)
Pt denies SI today.
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
## 2015-04-07 NOTE — ED Provider Notes (Signed)
I was asked to see patient regarding until pain. Patient complains of pain in his second molar for "a year".  Exam shows the posterior/mesial cusp of the right second molar, tooth 31 is missing. No periodontal swelling. Floor the mouth is soft. No swelling or induration of the neck. Normal posterior pharynx.  Discussion could be periapical infection. Tooth is solid. Very likely simple chronic tooth pain. Plan will be seen in [place] in here. Plan continuation of penicillin. This should not preclude inpatient psychiatric care.  Rolland PorterMark Walrond, MD 04/07/15 1949

## 2015-04-07 NOTE — ED Notes (Signed)
A snack and drink given to patient, and a regular diet ordered for patient for lunch. 

## 2015-04-08 ENCOUNTER — Encounter (HOSPITAL_COMMUNITY): Payer: Self-pay

## 2015-04-08 ENCOUNTER — Inpatient Hospital Stay (HOSPITAL_COMMUNITY)
Admission: AD | Admit: 2015-04-08 | Discharge: 2015-04-11 | DRG: 885 | Disposition: A | Discharge status: Home / Self Care | Payer: Federal, State, Local not specified - Other | Source: Intra-hospital | Attending: Psychiatry | Admitting: Psychiatry

## 2015-04-08 DIAGNOSIS — F250 Schizoaffective disorder, bipolar type: Principal | ICD-10-CM | POA: Diagnosis present

## 2015-04-08 DIAGNOSIS — K219 Gastro-esophageal reflux disease without esophagitis: Secondary | ICD-10-CM | POA: Diagnosis present

## 2015-04-08 DIAGNOSIS — F17210 Nicotine dependence, cigarettes, uncomplicated: Secondary | ICD-10-CM | POA: Diagnosis present

## 2015-04-08 DIAGNOSIS — M545 Low back pain: Secondary | ICD-10-CM | POA: Diagnosis not present

## 2015-04-08 DIAGNOSIS — G8929 Other chronic pain: Secondary | ICD-10-CM

## 2015-04-08 DIAGNOSIS — F329 Major depressive disorder, single episode, unspecified: Secondary | ICD-10-CM | POA: Diagnosis present

## 2015-04-08 DIAGNOSIS — R45851 Suicidal ideations: Secondary | ICD-10-CM

## 2015-04-08 LAB — CBG MONITORING, ED: Glucose-Capillary: 152 mg/dL — ABNORMAL HIGH (ref 65–99)

## 2015-04-08 MED ORDER — ALUM & MAG HYDROXIDE-SIMETH 200-200-20 MG/5ML PO SUSP: 30.0000 mL | ORAL | Status: DC | PRN

## 2015-04-08 MED ORDER — HYDROCODONE-ACETAMINOPHEN 5-325 MG PO TABS: 1.0000 | ORAL_TABLET | Freq: Four times a day (QID) | ORAL | Status: DC | PRN

## 2015-04-08 MED ORDER — QUETIAPINE FUMARATE 200 MG PO TABS
200.0000 mg | ORAL_TABLET | Freq: Every day | ORAL | Status: DC
  Administered 2015-04-08: 200 mg via ORAL
  Filled 2015-04-08 (×4): qty 1

## 2015-04-08 MED ORDER — QUETIAPINE FUMARATE 100 MG PO TABS
100.0000 mg | ORAL_TABLET | Freq: Every day | ORAL | Status: DC
  Filled 2015-04-08: qty 1

## 2015-04-08 MED ORDER — TRAZODONE HCL 100 MG PO TABS
100.0000 mg | ORAL_TABLET | Freq: Every day | ORAL | Status: DC
  Filled 2015-04-08 (×2): qty 1

## 2015-04-08 MED ORDER — CYCLOBENZAPRINE HCL 10 MG PO TABS
10.0000 mg | ORAL_TABLET | Freq: Three times a day (TID) | ORAL | Status: DC | PRN
  Administered 2015-04-08 – 2015-04-10 (×6): 10 mg via ORAL
  Filled 2015-04-08 (×6): qty 1

## 2015-04-08 MED ORDER — HYDROCODONE-ACETAMINOPHEN 5-325 MG PO TABS
1.0000 | ORAL_TABLET | Freq: Three times a day (TID) | ORAL | Status: DC | PRN
  Administered 2015-04-08 – 2015-04-11 (×7): 1 via ORAL
  Filled 2015-04-08 (×7): qty 1

## 2015-04-08 MED ORDER — PANTOPRAZOLE SODIUM 40 MG PO TBEC
40.0000 mg | DELAYED_RELEASE_TABLET | Freq: Every day | ORAL | Status: DC
  Administered 2015-04-08 – 2015-04-11 (×4): 40 mg via ORAL
  Filled 2015-04-08 (×6): qty 1

## 2015-04-08 MED ORDER — OMEPRAZOLE MAGNESIUM 20 MG PO TBEC: 20.0000 mg | DELAYED_RELEASE_TABLET | Freq: Every day | ORAL | Status: DC

## 2015-04-08 MED ORDER — HYDROXYZINE HCL 25 MG PO TABS
25.0000 mg | ORAL_TABLET | Freq: Four times a day (QID) | ORAL | Status: DC | PRN
  Administered 2015-04-08 – 2015-04-11 (×5): 25 mg via ORAL
  Filled 2015-04-08 (×3): qty 1
  Filled 2015-04-08: qty 10
  Filled 2015-04-08 (×2): qty 1

## 2015-04-08 MED ORDER — NICOTINE 21 MG/24HR TD PT24
21.0000 mg | MEDICATED_PATCH | Freq: Every day | TRANSDERMAL | Status: DC
Start: 2015-04-09 — End: 2015-04-10
  Administered 2015-04-09: 21 mg via TRANSDERMAL
  Filled 2015-04-08 (×3): qty 1

## 2015-04-08 MED ORDER — ACETAMINOPHEN 325 MG PO TABS: 650.0000 mg | ORAL_TABLET | Freq: Four times a day (QID) | ORAL | Status: DC | PRN

## 2015-04-08 MED ORDER — MAGNESIUM HYDROXIDE 400 MG/5ML PO SUSP: 30.0000 mL | Freq: Every day | ORAL | Status: DC | PRN

## 2015-04-08 NOTE — ED Provider Notes (Signed)
Patient alert ambulates without difficulty. Glasgow Coma Score 15. [facility] Dr Elna BreslowEappen accepting MD Results for orders placed or performed during the hospital encounter of 04/02/15  Comprehensive metabolic panel  Result Value Ref Range   Sodium 139 135 - 145 mmol/L   Potassium 4.2 3.5 - 5.1 mmol/L   Chloride 105 101 - 111 mmol/L   CO2 24 22 - 32 mmol/L   Glucose, Bld 65 65 - 99 mg/dL   BUN 12 6 - 20 mg/dL   Creatinine, Ser 1.611.35 (H) 0.61 - 1.24 mg/dL   Calcium 9.1 8.9 - 09.610.3 mg/dL   Total Protein 6.4 (L) 6.5 - 8.1 g/dL   Albumin 3.4 (L) 3.5 - 5.0 g/dL   AST 37 15 - 41 U/L   ALT 27 17 - 63 U/L   Alkaline Phosphatase 55 38 - 126 U/L   Total Bilirubin 1.2 0.3 - 1.2 mg/dL   GFR calc non Af Amer >60 >60 mL/min   GFR calc Af Amer >60 >60 mL/min   Anion gap 10 5 - 15  Ethanol (ETOH)  Result Value Ref Range   Alcohol, Ethyl (B) <5 <5 mg/dL  Salicylate level  Result Value Ref Range   Salicylate Lvl <4.0 2.8 - 30.0 mg/dL  Acetaminophen level  Result Value Ref Range   Acetaminophen (Tylenol), Serum <10 (L) 10 - 30 ug/mL  CBC  Result Value Ref Range   WBC 8.1 4.0 - 10.5 K/uL   RBC 4.99 4.22 - 5.81 MIL/uL   Hemoglobin 14.0 13.0 - 17.0 g/dL   HCT 04.541.6 40.939.0 - 81.152.0 %   MCV 83.4 78.0 - 100.0 fL   MCH 28.1 26.0 - 34.0 pg   MCHC 33.7 30.0 - 36.0 g/dL   RDW 91.414.7 78.211.5 - 95.615.5 %   Platelets 364 150 - 400 K/uL  Urine rapid drug screen (hosp performed) (Not at [facility])  Result Value Ref Range   Opiates NONE DETECTED NONE DETECTED   Cocaine POSITIVE (A) NONE DETECTED   Benzodiazepines NONE DETECTED NONE DETECTED   Amphetamines NONE DETECTED NONE DETECTED   Tetrahydrocannabinol NONE DETECTED NONE DETECTED   Barbiturates NONE DETECTED NONE DETECTED  CBG monitoring, ED  Result Value Ref Range   Glucose-Capillary 141 (H) 65 - 99 mg/dL  CBG monitoring, ED  Result Value Ref Range   Glucose-Capillary 152 (H) 65 - 99 mg/dL   Comment 1 Notify RN    Comment 2 Document in Chart     No results found.   Doug SouSam Meri Pelot, MD 04/08/15 1053

## 2015-04-08 NOTE — ED Notes (Signed)
A snack and drink was given to patient, and a regular diet ordered for lunch.

## 2015-04-08 NOTE — ED Notes (Signed)
Pt lying on bed w/eyes closed. Respirations even, unlabored. Pt given Ativan and Tylenol as requested.

## 2015-04-08 NOTE — Progress Notes (Signed)
Patient did attend the evening speaker AA meeting. Pt was attentive, supportive, and engaged.  

## 2015-04-08 NOTE — Tx Team (Signed)
Initial Interdisciplinary Treatment Plan   PATIENT STRESSORS: Health problems Substance abuse   PATIENT STRENGTHS: Communication skills Religious Affiliation Supportive family/friends   PROBLEM LIST: Problem List/Patient Goals Date to be addressed Date deferred Reason deferred Estimated date of resolution  Depression 04/08/15     Substance Abuse 04/08/15     "Get on the right medications" 04/08/15     "To know the medications are working so I can give back to society" 04/08/15                                    DISCHARGE CRITERIA:  Motivation to continue treatment in a less acute level of care Verbal commitment to aftercare and medication compliance  PRELIMINARY DISCHARGE PLAN: Outpatient therapy Medication managment  PATIENT/FAMIILY INVOLVEMENT: This treatment plan has been presented to and reviewed with the patient, Lonnie [name].  The patient and family have been given the opportunity to ask questions and make suggestions.  Norm ParcelHeather V Ensley Blas 04/08/2015, 1:53 PM

## 2015-04-08 NOTE — Progress Notes (Signed)
Recreation Therapy Notes  Animal-Assisted Activity (AAA) Program Checklist/Progress Notes Patient Eligibility Criteria Checklist & Daily Group note for Rec Tx Intervention  Date: 03.14.2017 Time: 2:45am [facility] [address]    AAA/T Program Assumption of Risk Form signed by Patient/ or Parent Legal Guardian yes  Patient is free of allergies or sever asthma yes  Patient reports no fear of animals yes  Patient reports no history of cruelty to animals yes  Patient understands his/her participation is voluntary yes  Behavioral Response: Did not attend.   Marykay Lexenise L Tashina Credit, LRT/CTRS        Brynnley Dayrit L 04/08/2015 3:05 PM

## 2015-04-08 NOTE — Progress Notes (Signed)
Patient ID: Lonnie [name], male   DOB: 08/01/1965, 50 y.o.   MRN: [id number]   Report received from admitting nurse. Pt given the opportunity to express concerns and ask questions. Pt currently denies SI/HI and A/V hallucinations. Pt verbally agrees to seek staff if SI/HI or A/VH occurs and to consult with staff before acting on these thoughts. Pt stable at this time, will continue to monitor.

## 2015-04-08 NOTE — ED Notes (Signed)
Pt signed consent forms - [facility]

## 2015-04-08 NOTE — Progress Notes (Signed)
Patient ID: Lonnie [name], male   DOB: 03/06/1965, 50 y.o.   MRN: [id number] D: Patient observed watching TV and interacting well with peers on approach. Pt attended evening AA group. Denies SI/HI/AVH.No behavioral issues noted.  A: Support and encouragement offered as needed. Medications administered as prescribed.  R: Patient cooperative and appropriate on unit. Will continue to monitor patient for safety and stability.

## 2015-04-08 NOTE — [facility] Suicide Risk Assessment (Signed)
[facility] Ad[facility]icide [facility]   Nursing information obtained from:    Demographic factors:    Current Mental Status:    Loss Factors:    Historical Factors:    Risk Reduction Factors:     Total Time spent with patient: 45 minutes Principal Problem: Schizoaffective disorder, bipolar type (HCC) Diagnosis:   Patient Active Problem List   Diagnosis Date Noted  . Schizoaffective disorder, bipolar type (HCC) [F25.0] 04/08/2015  . Chest pain [R07.9] 11/06/2014  . Depression [F32.9] 10/28/2014  . MDD (major depressive disorder), recurrent, severe, with psychosis (HCC) [F33.3] 07/28/2014  . Slurred speech [R47.81] 08/09/2013  . TIA (transient ischemic attack) [G45.9] 08/09/2013  . Polyuria [R35.8] 08/09/2013  . Hematemesis [K92.0] 07/30/2013  . GI bleed [K92.2] 07/30/2013  . Schizoaffective disorder (HCC) [F25.9] 07/29/2013  . Major depression, recurrent (HCC) [F33.9] 07/25/2013  . Cocaine dependence (HCC) [F14.20] 07/25/2013  . Alcohol dependence with alcohol-induced mood disorder (HCC) [F10.24] 07/24/2013  . Depression, major, recurrent (HCC) [F33.9] 05/17/2013  . Bilateral foot pain [id number][M79.671, M79.672] 11/25/2011  . Chronic low back pain [M54.5, G89.29] 10/15/2011   Subjective Data: see admission H and P  Continued Clinical Symptoms:  Alcohol Use Disorder Identification Test Final Score (AUDIT): 0 The "Alcohol Use Disorders Identification Test", Guidelines for Use in Primary Care, Second Edition.  World Science writerHealth Organization [facility](WHO). Score between 0-7:  no or low risk or alcohol related problems. Score between 8-15:  moderate risk of alcohol related problems. Score between 16-19:  high risk of alcohol related problems. Score 20 or above:  warrants further diagnostic evaluation for alcohol dependence and treatment.   CLINICAL FACTORS:   Alcohol/Substance Abuse/Dependencies, Schizoaffective disorder    Psychiatric Specialty Exam: ROS  Blood pressure 114/71, pulse 92,  temperature 97.6 F (36.4 C), temperature source Oral, resp. rate 18, height 5\' 9"  (1.753 m), weight 82.555 kg (182 lb).Body mass index is 26.86 kg/(m^2).   COGNITIVE FEATURES THAT CONTRIBUTE TO RISK:  Closed-mindedness, Polarized thinking and Thought constriction (tunnel vision)    SUICIDE RISK:   Moderate:  Frequent suicidal ideation with limited intensity, and duration, some specificity in terms of plans, no associated intent, good self-control, limited dysphoria/symptomatology, some risk factors present, and identifiable protective factors, including available and accessible social support.  PLAN OF CARE: see admission H and P  I certify that inpatient services furnished can reasonably be expected to improve the patient's condition.   Rachael FeeLUGO,Joeziah Voit A, MD 04/08/2015, 6:59 PM

## 2015-04-08 NOTE — [facility] Group Notes (Signed)
[facility] LCSW Group Therapy  04/08/2015 2:02 PM  Type of Therapy:  Group Therapy  Participation Level:  Did Not Attend-pt chose to remain in bed. Invited  Summary of Progress/Problems: [facility] Speaker came to talk about his personal journey with substance abuse and addiction.   Smart, Lonnie Rybacki LCSW 04/08/2015, 2:02 PM

## 2015-04-08 NOTE — ED Notes (Addendum)
[facility] - Dr Dub MikesLugo - and is in agreement w/tx plan. Pt reading Bible w/sitter.

## 2015-04-08 NOTE — Progress Notes (Signed)
Lonnie [name] was voluntarily admitted to room 307-1 from [facility] for suicidal thoughts, a/v hallucinations and cocaine abuse.  He states that he has been taking Risperdal and trazodone which hasn't been helping recently.  He states that he only gets about 5 hours of sleep and "that isn't enough."  He currently denies any SI/HI or A/V hallucinations at this time.  He denies any pain or discomfort and appears to be in no physical distress.  He states that he had a stroke a few months back but suffers no weakness at this time.  Belonging searched and secured in [address] (basin with misc. items (toiletries), plastic bag with Neurosurgeonelectric razor, shoe strings black).  Skin assessment completed and tattoos on both upper arms and upper chest.  Both feet dry and plantar wart on bottom of left foot.  Admission paperwork completed.  Oriented him to the unit.  Q 15 minute checks initiated for safety.

## 2015-04-08 NOTE — ED Notes (Signed)
Pt on phone at nurses' desk. 
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
## 2015-04-09 LAB — LIPID PANEL
Cholesterol: 219 mg/dL — ABNORMAL HIGH (ref 0–200)
HDL: 81 mg/dL (ref >40)
LDL CALC: 129 mg/dL — AB (ref 0–99)
TRIGLYCERIDES: 47 mg/dL (ref <150)
Total CHOL/HDL Ratio: 2.7 RATIO
VLDL: 9 mg/dL (ref 0–40)

## 2015-04-09 MED ORDER — QUETIAPINE FUMARATE 400 MG PO TABS
400.0000 mg | ORAL_TABLET | Freq: Every day | ORAL | Status: DC
  Administered 2015-04-09 – 2015-04-10 (×2): 400 mg via ORAL
  Filled 2015-04-09: qty 1
  Filled 2015-04-09: qty 7
  Filled 2015-04-09: qty 1
  Filled 2015-04-09: qty 2
  Filled 2015-04-09 (×2): qty 1

## 2015-04-09 MED ORDER — FLUOXETINE HCL 20 MG PO CAPS
20.0000 mg | ORAL_CAPSULE | Freq: Every day | ORAL | Status: DC
  Administered 2015-04-09 – 2015-04-11 (×3): 20 mg via ORAL
  Filled 2015-04-09 (×2): qty 1
  Filled 2015-04-09: qty 7
  Filled 2015-04-09 (×5): qty 1
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
## 2015-04-09 NOTE — Progress Notes (Signed)
Recreation Therapy Notes  Date: 03.15.2017 Time: 9:30am Location: 300 [address] Group Room   Group Topic: Stress Management  Goal Area(s) Addresses:  Patient will actively participate in stress management techniques presented during session.   Behavioral Response: Did not attend.   Marykay Lexenise L Roberto Hlavaty, LRT/CTRS        Reia Viernes L 04/09/2015 1:52 PM

## 2015-04-09 NOTE — [facility] Suicide Risk Assessment (Signed)
[facility] INPATIENT:  Family/Significant Other Suicide Prevention Education  Suicide Prevention Education:  Patient Refusal for Family/Significant Other Suicide Prevention Education: The patient Lonnie [name] has refused to provide written consent for family/significant other to be provided Family/Significant Other Suicide Prevention Education during admission and/or prior to discharge.  Physician notified. SPE reviewed with patient and brochure provided. Patient encouraged to return to hospital if having suicidal thoughts, patient verbalized his/her understanding and has no further questions at this time.   Lonnie [name], Lonnie [name] 04/09/2015, 12:47 PM
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
## 2015-04-09 NOTE — Progress Notes (Signed)
Pt attended evening NA group.  

## 2015-04-09 NOTE — Tx Team (Signed)
Interdisciplinary Treatment Plan Update (Adult) Date: 04/09/2015    Time Reviewed: 9:30 AM  Progress in Treatment: Attending groups: Continuing to assess, patient new to milieu Participating in groups: Continuing to assess, patient new to milieu Taking medication as prescribed: Yes Tolerating medication: Yes Family/Significant other contact made: No, CSW assessing for appropriate contacts Patient understands diagnosis: Yes Discussing patient identified problems/goals with staff: Yes Medical problems stabilized or resolved: Yes Denies suicidal/homicidal ideation: Yes Issues/concerns per patient self-inventory: Yes Other:  New problem(s) identified: N/A  Discharge Plan or Barriers: Patient plans to return home to follow up with outpatient services.   Reason for Continuation of Hospitalization:  Depression Anxiety Medication Stabilization   Comments: N/A  Estimated length of stay: 3-5 days    Patient is a 50 year old male with a diagnosis of Major Depressive Disorder, Cocaine Use Disorder, history of Schizoaffective Disorder. Pt presented to the hospital with suicidal ideations and auditory hallucinations, as well as cocaine abuse. Pt reports primary trigger(s) for admission was . Patient will benefit from crisis stabilization, medication evaluation, group therapy and psycho education in addition to case management for discharge planning. At discharge, it is recommended that Pt remain compliant with established discharge plan and continued treatment.   Review of initial/current patient goals per problem list:  1. Goal(s): Patient will participate in aftercare plan   Met: Yes   Target date: 3-5 days post admission date   As evidenced by: Patient will participate within aftercare plan AEB aftercare provider and housing plan at discharge being identified.  3/15: Goal met. Patient plans to return home to follow up with outpatient services.   2. Goal (s): Patient will  exhibit decreased depressive symptoms and suicidal ideations.   Met: Goal progressing   Target date: 3-5 days post admission date   As evidenced by: Patient will utilize self rating of depression at 3 or below and demonstrate decreased signs of depression or be deemed stable for discharge by MD.  3/15: Patient rates depression at 6, denies SI.    3. Goal(s): Patient will demonstrate decreased signs and symptoms of anxiety.   Met: Yes   Target date: 3-5 days post admission date   As evidenced by: Patient will utilize self rating of anxiety at 3 or below and demonstrated decreased signs of anxiety, or be deemed stable for discharge by MD  3/15; Goal met. Patient rates anxiety at 1.    4. Goal(s): Patient will demonstrate decreased signs of withdrawal due to substance abuse   Met: Yes   Target date: 3-5 days post admission date   As evidenced by: Patient will produce a CIWA/COWS score of 0, have stable vitals signs, and no symptoms of withdrawal    3/15: Goal met. No withdrawal symptoms reported at this time per medical chart.    5. Goal(s): Patient will demonstrate decreased signs of psychosis  * Met: Goal progressing  * Target date: 3-5 days post admission date  * As evidenced by: Patient will demonstrate decreased frequency of AVH or return to baseline function  3/15: Goal not met: Pt to take medication as prescribed to decrease psychosis to baseline.     Attendees: Patient:    Family:    Physician: Dr. Parke Poisson; Dr. Sabra Heck 04/09/2015 9:30 AM  Nursing: Desma Paganini, Eulogio Bear, Mayra Neer, Gaylan Gerold, RN 04/09/2015 9:30 AM  Clinical Social Worker: Erasmo Downer Ereka Brau, LCSW 04/09/2015 9:30 AM  Other: Peri Maris, LCSWA; Doon, LCSW  04/09/2015 9:30 AM  Other: Edwyna Shell,  LCSW  04/09/2015 9:30 AM  Other: Lars Pinks, Case Manager 04/09/2015 9:30 AM  Other:  Samuel Jester, NP 04/09/2015 9:30 AM  Other:       Scribe for Treatment Team:   Tilden Fossa, [place]

## 2015-04-09 NOTE — Progress Notes (Signed)
D:Pt rates depression, hopelessness and anxiety as a 5 on 0-10 scale with 10 being the most. Pt has a flat affect and reports having racing thoughts with decrease sleep. He says that his Seroquel helped last night but not enough to sleep all night. Pt reports that he has not had auditory hallucinations since yesterday. He takes Risperdal at home.  A:Offered support, encouragement, and 15 minute checks. R:Pt denies si and hi. Safety maintained on the unit.

## 2015-04-09 NOTE — Plan of Care (Signed)
Problem: Alteration in mood & ability to function due to Goal: STG-Patient will attend groups Outcome: Progressing Pt attended and engaged in evening AA group

## 2015-04-09 NOTE — [facility] Group Notes (Signed)
   [facility]  04/09/2015  8:45 AM   Participation Quality: Alert, Appropriate and Oriented  Mood/Affect: Depressed and Flat  Depression Rating: 6  Anxiety Rating: 1  Thoughts of Suicide: Pt denies SI/HI  Will you contract for safety? Yes  Current AVH: Pt denies  Plan for Discharge/Comments: Pt attended discharge planning group and actively participated in group. CSW provided pt with today's workbook. Patient plans to return home to follow up with outpatient services.   Transportation Means: Pt reports access to transportation  Supports: No supports mentioned at this time  Samuella BruinKristin Purity Irmen, MSW, [employer] JohnsonLCSW Clinical Social Worker [facility] [phone]

## 2015-04-10 LAB — PROLACTIN: PROLACTIN: 48.5 ng/mL — AB (ref 4.0–15.2)

## 2015-04-10 LAB — HEMOGLOBIN A1C
Hgb A1c MFr Bld: 5.7 % — ABNORMAL HIGH (ref 4.8–5.6)
Mean Plasma Glucose: 117 mg/dL

## 2015-04-10 MED ORDER — NICOTINE POLACRILEX 2 MG MT GUM
2.0000 mg | CHEWING_GUM | OROMUCOSAL | Status: DC | PRN
  Administered 2015-04-10 – 2015-04-11 (×2): 2 mg via ORAL
  Filled 2015-04-10: qty 1
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
## 2015-04-10 NOTE — Progress Notes (Signed)
D: Pt denies SI/HI/AVH. Pt is pleasant and cooperative. Pt stated he had a lot of things going on at one time, after his uncle past , there were some wishes his uncle had that his Ex- wife would not allow due to them technically still being married which caused the pt some un needed stress. Pt appears to have insight into Tx.   A: Pt was offered support and encouragement. Pt was given scheduled medications. Pt was encourage to attend groups. Q 15 minute checks were done for safety.   R:Pt attends groups and interacts well with peers and staff. Pt is taking medication. Pt has no complaints at this time.Pt receptive to treatment and safety maintained on unit.

## 2015-04-10 NOTE — Progress Notes (Signed)
[facility] Group Notes:  (Nursing/MHT/Case Management/Adjunct)  Date:  04/10/2015  Time: 1000 Type of Therapy:  Nurse Education  Participation Level:  Active  Participation Quality:  Appropriate and Sharing  Affect:  Anxious  Cognitive:  Alert and Oriented  Insight:  Improving  Engagement in Group:  Engaged  Modes of Intervention:  Discussion, Socialization and Support  Summary of Progress/Problems:Pt's goal for today is to stay positive and focused. Pt participated and shared during this group.  Beatrix ShipperWright, Mayjor Ager Martin 04/10/2015, 2:22 PM

## 2015-04-10 NOTE — [facility] Group Notes (Signed)
[facility] LCSW Group Therapy 04/10/2015 1:15 PM Type of Therapy: Group Therapy Participation Level: Active  Participation Quality: Attentive, Sharing and Supportive  Affect: Appropriate  Cognitive: Alert and Oriented  Insight: Developing/Improving and Engaged  Engagement in Therapy: Developing/Improving and Engaged  Modes of Intervention: Activity, Clarification, Confrontation, Discussion, Education, Exploration, Limit-setting, Orientation, Problem-solving, Rapport Building, Dance movement psychotherapisteality Testing, Socialization and Support  Summary of Progress/Problems: Patient was attentive and engaged with speaker from [employer]. Patient was attentive to speaker while they shared their story of dealing with mental health and overcoming it. Patient expressed interest in their programs and services and received information on their agency. Patient processed ways they can relate to the speaker.   Samuella BruinKristin Cyenna Rebello, LCSW Clinical Social Worker [facility] [phone]

## 2015-04-10 NOTE — Plan of Care (Signed)
Problem: Alteration in mood Goal: LTG-Patient reports reduction in suicidal thoughts (Patient reports reduction in suicidal thoughts and is able to verbalize a safety plan for whenever patient is feeling suicidal)  Outcome: Progressing Pt denies SI at this time     

## 2015-04-10 NOTE — Progress Notes (Signed)
Psychoeducational Group Note  Date:  04/10/2015 Time: 2100    Group Topic/Focus:  wrap up group  Participation Level: Did Not Attend  Participation Quality:  Not Applicable  Affect:  Not Applicable  Cognitive:  Not Applicable  Insight:  Not Applicable  Engagement in Group: Not Applicable  Additional Comments:  Pt was notified that group was beginning but returned to his room reporting his Seroquel had him feeling tired.   Johann CapersMcNeil, Marlet Korte S 04/10/2015, 10:01 PM

## 2015-04-10 NOTE — Progress Notes (Signed)
D:Pt rates depression and anxiety as an 8 on 0-10 scale with 10 being the most. Pt has been in the dayroom interacting with his peers. Pt reports that he wants to stay clean for himself and not for anyone else. He says that his feelings have changed since he has tried to stay substance free for others in the past and it has not worked. Pt's mood has improved throughout the day. A:Offered support, encouragement and 15 minute checks. R:Pt denies si and hi. Safety maintained on the unit.

## 2015-04-11 MED ORDER — QUETIAPINE FUMARATE 400 MG PO TABS: 400.0000 mg | ORAL_TABLET | Freq: Every day | ORAL | Status: DC

## 2015-04-11 MED ORDER — NICOTINE POLACRILEX 2 MG MT GUM: 2.0000 mg | CHEWING_GUM | OROMUCOSAL | Status: DC | PRN

## 2015-04-11 MED ORDER — OMEPRAZOLE MAGNESIUM 20 MG PO TBEC: 20.0000 mg | DELAYED_RELEASE_TABLET | Freq: Every day | ORAL | Status: DC

## 2015-04-11 MED ORDER — FLUOXETINE HCL 20 MG PO CAPS: 20.0000 mg | ORAL_CAPSULE | Freq: Every day | ORAL | Status: DC

## 2015-04-11 MED ORDER — HYDROXYZINE HCL 25 MG PO TABS: 25.0000 mg | ORAL_TABLET | Freq: Four times a day (QID) | ORAL | Status: DC | PRN

## 2015-04-11 MED ORDER — CYCLOBENZAPRINE HCL 10 MG PO TABS: 10.0000 mg | ORAL_TABLET | Freq: Three times a day (TID) | ORAL | Status: DC | PRN

## 2015-04-11 NOTE — Progress Notes (Signed)
D: Patient denies SI/HI or AVH.  Pt. Appears flat and slightly anxious this morning.  He reports that sleep and appetite are good.  Per patients self inventory his depression, hopelessness and anxiety are 5/10.  Pt. Is up in the dayroom interacting with others.  Pt. Otherwise pleasant and cooperative.  A: Patient given emotional support from RN. Patient encouraged to come to staff with concerns and/or questions. Patient's medication routine continued. Patient's orders and plan of care reviewed.   R: Patient remains appropriate and cooperative. Will continue to monitor patient q15 minutes for safety.

## 2015-04-11 NOTE — Progress Notes (Signed)
  [facility] :  Will you be returning to the same living situation after discharge:  Yes,  patient plans to return home At discharge, do you have transportation home?: Yes,  patient provided with bus pass Do you have the ability to pay for your medications: Yes,  patient will be provided with prescriptions at discharge  Release of information consent forms completed and in the chart;  Patient's signature needed at discharge.  Patient to Follow up at: Follow-up Information    [facility].   Specialty:  Behavioral Health   Why:  Medication management appointment on Monday March 20th at 1:20pm. Ask about therapy services. Call office if you need to reschedule.   Contact information:   [address] [place] [address] [phone]       Next level of care provider has [facility] to [facility] Link:no  Safety Planning and Suicide Prevention discussed: Yes,  with patient   Have you used any form of tobacco in the last 30 days? (Cigarettes, Smokeless Tobacco, Cigars, and/or Pipes): Yes  Has patient been referred to the Quitline?: Patient refused referral  Patient has been referred for addiction treatment: Yes  Lonna Rabold, [place]ristin L 04/11/2015, 12:19 PM

## 2015-04-11 NOTE — Tx Team (Signed)
Interdisciplinary Treatment Plan Update (Adult) Date: 04/11/2015    Time Reviewed: 9:30 AM  Progress in Treatment: Attending groups: Yes Participating in groups: Yes Taking medication as prescribed: Yes Tolerating medication: Yes Family/Significant other contact made: No, patient has declined family contact Patient understands diagnosis: Yes Discussing patient identified problems/goals with staff: Yes Medical problems stabilized or resolved: Yes Denies suicidal/homicidal ideation: Yes Issues/concerns per patient self-inventory: Yes Other:  New problem(s) identified: N/A  Discharge Plan or Barriers: Patient plans to return home to follow up with outpatient services.   Reason for Continuation of Hospitalization:  Depression Anxiety Medication Stabilization   Comments: N/A  Estimated length of stay: discharge anticipated for today 04/11/15    Patient is a 50 year old male with a diagnosis of Major Depressive Disorder, Cocaine Use Disorder, history of Schizoaffective Disorder. Pt presented to the hospital with suicidal ideations and auditory hallucinations, as well as cocaine abuse. Pt reports primary trigger(s) for admission was . Patient will benefit from crisis stabilization, medication evaluation, group therapy and psycho education in addition to case management for discharge planning. At discharge, it is recommended that Pt remain compliant with established discharge plan and continued treatment.   Review of initial/current patient goals per problem list:  1. Goal(s): Patient will participate in aftercare plan   Met: Yes   Target date: 3-5 days post admission date   As evidenced by: Patient will participate within aftercare plan AEB aftercare provider and housing plan at discharge being identified.  3/15: Goal met. Patient plans to return home to follow up with outpatient services.   2. Goal (s): Patient will exhibit decreased depressive symptoms and suicidal  ideations.   Met: Adequate for discharge per MD   Target date: 3-5 days post admission date   As evidenced by: Patient will utilize self rating of depression at 3 or below and demonstrate decreased signs of depression or be deemed stable for discharge by MD.  3/15: Patient rates depression at 6, denies SI.   3/17: Adequate for discharge per MD. Patient rates depression at 5 today, denies SI.   3. Goal(s): Patient will demonstrate decreased signs and symptoms of anxiety.   Met: Yes   Target date: 3-5 days post admission date   As evidenced by: Patient will utilize self rating of anxiety at 3 or below and demonstrated decreased signs of anxiety, or be deemed stable for discharge by MD  3/15: Goal met. Patient rates anxiety at 1.    4. Goal(s): Patient will demonstrate decreased signs of withdrawal due to substance abuse   Met: Yes   Target date: 3-5 days post admission date   As evidenced by: Patient will produce a CIWA/COWS score of 0, have stable vitals signs, and no symptoms of withdrawal    3/15: Goal met. No withdrawal symptoms reported at this time per medical chart.    5. Goal(s): Patient will demonstrate decreased signs of psychosis  * Met: Adequate for discharge  * Target date: 3-5 days post admission date  * As evidenced by: Patient will demonstrate decreased frequency of AVH or return to baseline function  3/15: Goal not met: Pt to take medication as prescribed to decrease psychosis to baseline.   3/17: Adequate for discharge per MD. Patient reports baseline symptoms, reports feeling safe to return home today.    Attendees: Patient:    Family:    Physician: Dr. Parke Poisson; Dr. Sabra Heck 04/11/2015 9:30 AM  Nursing: Grayland Ormond, Marcella Dubs, Sharee Pimple, [place] 04/11/2015  9:30 AM  Clinical Social Worker: Tilden Fossa, LCSW 04/11/2015 9:30 AM  Other: Peri Maris, LCSWA; Heather Smart, LCSW  04/11/2015 9:30 AM  Other: Norberto Sorenson,  P4CC 04/11/2015 9:30 AM  Other: Lars Pinks, Case Manager 04/11/2015 9:30 AM  Other: Agustina Caroli, May Augustin, NP 04/11/2015 9:30 AM  Other:          Scribe for Treatment Team:  Tilden Fossa, [place]
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
## 2015-04-11 NOTE — [facility] Group Notes (Signed)
   [facility]  04/11/2015  8:45 AM   Participation Quality: Alert, Appropriate and Oriented  Mood/Affect: blunted  Depression Rating: 5  Anxiety Rating: 5  Thoughts of Suicide: Pt denies SI/HI  Will you contract for safety? Yes  Current AVH: Pt denies  Plan for Discharge/Comments: Pt attended discharge planning group and actively participated in group. CSW provided pt with today's workbook. Patient plans to return home with his grandparents to follow up with outpatient services.   Transportation Means: Pt reports access to transportation  Supports: No supports mentioned at this time  Samuella BruinKristin Ximenna Fonseca, MSW, Johnson [employer] JohnsonLCSW Clinical Social Worker [facility] [phone]
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
## 2015-10-15 ENCOUNTER — Emergency Department (HOSPITAL_BASED_OUTPATIENT_CLINIC_OR_DEPARTMENT_OTHER): Payer: Self-pay

## 2015-10-15 ENCOUNTER — Encounter (HOSPITAL_BASED_OUTPATIENT_CLINIC_OR_DEPARTMENT_OTHER): Payer: Self-pay | Admitting: *Deleted

## 2015-10-15 ENCOUNTER — Emergency Department (HOSPITAL_BASED_OUTPATIENT_CLINIC_OR_DEPARTMENT_OTHER)
Admission: EM | Admit: 2015-10-15 | Discharge: 2015-10-15 | Disposition: A | Discharge status: Home / Self Care | Payer: Self-pay | Attending: Emergency Medicine | Admitting: Emergency Medicine

## 2015-10-15 DIAGNOSIS — F17210 Nicotine dependence, cigarettes, uncomplicated: Secondary | ICD-10-CM | POA: Insufficient documentation

## 2015-10-15 DIAGNOSIS — M25562 Pain in left knee: Secondary | ICD-10-CM | POA: Insufficient documentation

## 2015-10-15 DIAGNOSIS — I10 Essential (primary) hypertension: Secondary | ICD-10-CM | POA: Insufficient documentation

## 2015-10-15 DIAGNOSIS — E119 Type 2 diabetes mellitus without complications: Secondary | ICD-10-CM | POA: Insufficient documentation

## 2015-10-15 MED ORDER — IBUPROFEN 800 MG PO TABS: 800.0000 mg | ORAL_TABLET | Freq: Three times a day (TID) | ORAL | 0 refills | Status: DC

## 2015-10-15 MED ORDER — IBUPROFEN 800 MG PO TABS
800.0000 mg | ORAL_TABLET | Freq: Once | ORAL | Status: AC
  Administered 2015-10-15: 800 mg via ORAL
  Filled 2015-10-15: qty 1

## 2015-10-15 NOTE — ED Notes (Signed)
MD at bedside discussing test results and dispo plan of care. 

## 2015-10-15 NOTE — ED Triage Notes (Signed)
Pt was mowing and stepped in hole. C/O left knee pain. Difficulty ambulating. "Feels like needles. Pain is worse on medial aspect. +dpp palp.

## 2015-10-15 NOTE — Discharge Instructions (Signed)
Your workup was reassuring today that you do not have a bony injury or dislocation to your knee.  We recommend that you use the provided Ace wrap and crutches as needed, but you can continue to bear weight as tolerated.  Read through the included information about routine care for injuries.  Follow up as recommended if you are still having problems in about a week. ° °Knee Pain °Knee pain is a very common symptom and can have many causes. Knee pain often goes away when you follow your health care provider's instructions for relieving pain and discomfort at home. However, knee pain can develop into a condition that needs treatment. Some conditions may include: °Arthritis caused by wear and tear (osteoarthritis). °Arthritis caused by swelling and irritation (rheumatoid arthritis or gout). °A cyst or growth in your knee. °An infection in your knee joint. °An injury that will not heal. °Damage, swelling, or irritation of the tissues that support your knee (torn ligaments or tendinitis). °If your knee pain continues, additional tests may be ordered to diagnose your condition. Tests may include X-rays or other imaging studies of your knee. You may also need to have fluid removed from your knee. Treatment for ongoing knee pain depends on the cause, but treatment may include: °Medicines to relieve pain or swelling. °Steroid injections in your knee. °Physical therapy. °Surgery. °HOME CARE INSTRUCTIONS °Take medicines only as directed by your health care provider. °Rest your knee and keep it raised (elevated) while you are resting. °Do not do things that cause or worsen pain. °Avoid high-impact activities or exercises, such as running, jumping rope, or doing jumping jacks. °Apply ice to the knee area: °Put ice in a plastic bag. °Place a towel between your skin and the bag. °Leave the ice on for 20 minutes, 2-3 times a day. °Ask your health care provider if you should wear an elastic knee support. °Keep a pillow under your  knee when you sleep. °Lose weight if you are overweight. Extra weight can put pressure on your knee. °Do not use any tobacco products, including cigarettes, chewing tobacco, or electronic cigarettes. If you need help quitting, ask your health care provider. Smoking may slow the healing of any bone and joint problems that you may have. °SEEK MEDICAL CARE IF: °Your knee pain continues, changes, or gets worse. °You have a fever along with knee pain. °Your knee buckles or locks up. °Your knee becomes more swollen. °SEEK IMMEDIATE MEDICAL CARE IF:  °Your knee joint feels hot to the touch. °You have chest pain or trouble breathing. °  °This information is not intended to replace advice given to you by your health care provider. Make sure you discuss any questions you have with your health care provider. °  °Document Released: 11/08/2006 Document Revised: 02/01/2014 Document Reviewed: 08/27/2013 °Elsevier Interactive Patient Education ©2016 [employer] ° ° °Elastic Bandage and RICE  ° °WHAT DOES AN ELASTIC BANDAGE DO?  ° °Elastic bandages come in different shapes and sizes. They generally provide support to your injury and reduce swelling while you are healing, but they can perform different functions. Your health care provider will help you to decide what is best for your protection, recovery, or rehabilitation following an injury.  °WHAT ARE SOME GENERAL TIPS FOR USING AN ELASTIC BANDAGE?  °Use the bandage as directed by the maker of the bandage that you are using.  °Do not wrap the bandage too tightly. This may cut off the circulation in the arm or leg in the   area below the bandage.  °If part of your body beyond the bandage becomes blue, numb, cold, swollen, or is more painful, your bandage is most likely too tight. If this occurs, remove your bandage and reapply it more loosely. °See your health care provider if the bandage seems to be making your problems worse rather than better.  °An elastic bandage should be  removed and reapplied every 3-4 hours or as directed by your health care provider. °WHAT IS RICE?  °The routine care of many injuries includes rest, ice, compression, and elevation (RICE therapy).  °Rest  °Rest is required to allow your body to heal. Generally, you can resume your routine activities when you are comfortable and have been given permission by your health care provider.  °Ice  °Icing your injury helps to keep the swelling down and it reduces pain. Do not apply ice directly to your skin.  °Put ice in a plastic bag.  °Place a towel between your skin and the bag.  °Leave the ice on for 20 minutes, 2-3 times per day. °Do this for as Matina Rodier as you are directed by your health care provider.  °Compression  °Compression helps to keep swelling down, gives support, and helps with discomfort. Compression may be done with an elastic bandage.  °Elevation  °Elevation helps to reduce swelling and it decreases pain. If possible, your injured area should be placed at or above the level of your heart or the center of your chest.  °WHEN SHOULD I SEEK MEDICAL CARE?  °You should seek medical care if:  °You have persistent pain and swelling.  °Your symptoms are getting worse rather than improving. °These symptoms may indicate that further evaluation or further X-rays are needed. Sometimes, X-rays may not show a small broken bone (fracture) until a number of days later. Make a follow-up appointment with your health care provider. Ask when your X-ray results will be ready. Make sure that you get your X-ray results.  °WHEN SHOULD I SEEK IMMEDIATE MEDICAL CARE?  °You should seek immediate medical care if:  °You have a sudden onset of severe pain at or below the area of your injury.  °You develop redness or increased swelling around your injury.  °You have tingling or numbness at or below the area of your injury that does not improve after you remove the elastic bandage. °This information is not intended to replace advice given to  you by your health care provider. Make sure you discuss any questions you have with your health care provider.  °Document Released: 07/03/2001 Document Revised: 10/02/2014 Document Reviewed: 08/27/2013  °Elsevier Interactive Patient Education ©2016 [employer]  ° °
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
## 2015-10-20 ENCOUNTER — Encounter (HOSPITAL_COMMUNITY): Payer: Self-pay | Admitting: Emergency Medicine

## 2015-10-20 DIAGNOSIS — F1910 Other psychoactive substance abuse, uncomplicated: Secondary | ICD-10-CM | POA: Insufficient documentation

## 2015-10-20 DIAGNOSIS — T391X2A Poisoning by 4-Aminophenol derivatives, intentional self-harm, initial encounter: Secondary | ICD-10-CM | POA: Insufficient documentation

## 2015-10-20 DIAGNOSIS — Z79899 Other long term (current) drug therapy: Secondary | ICD-10-CM | POA: Insufficient documentation

## 2015-10-20 DIAGNOSIS — I10 Essential (primary) hypertension: Secondary | ICD-10-CM | POA: Insufficient documentation

## 2015-10-20 DIAGNOSIS — E119 Type 2 diabetes mellitus without complications: Secondary | ICD-10-CM | POA: Insufficient documentation

## 2015-10-20 DIAGNOSIS — Z8673 Personal history of transient ischemic attack (TIA), and cerebral infarction without residual deficits: Secondary | ICD-10-CM | POA: Insufficient documentation

## 2015-10-20 DIAGNOSIS — F17210 Nicotine dependence, cigarettes, uncomplicated: Secondary | ICD-10-CM | POA: Insufficient documentation

## 2015-10-20 LAB — COMPREHENSIVE METABOLIC PANEL
ALK PHOS: 57 U/L (ref 38–126)
ALT: 33 U/L (ref 17–63)
AST: 37 U/L (ref 15–41)
Albumin: 3.8 g/dL (ref 3.5–5.0)
Anion gap: 9 (ref 5–15)
BILIRUBIN TOTAL: 1.2 mg/dL (ref 0.3–1.2)
BUN: 13 mg/dL (ref 6–20)
CALCIUM: 8.8 mg/dL — AB (ref 8.9–10.3)
CO2: 22 mmol/L (ref 22–32)
Chloride: 101 mmol/L (ref 101–111)
Creatinine, Ser: 1.36 mg/dL — ABNORMAL HIGH (ref 0.61–1.24)
GFR calc Af Amer: >60 mL/min (ref >60)
GFR, EST NON AFRICAN AMERICAN: 60 mL/min — AB (ref >60)
GLUCOSE: 245 mg/dL — AB (ref 65–99)
POTASSIUM: 3.4 mmol/L — AB (ref 3.5–5.1)
Sodium: 132 mmol/L — ABNORMAL LOW (ref 135–145)
TOTAL PROTEIN: 6.5 g/dL (ref 6.5–8.1)

## 2015-10-20 LAB — CBC
HEMATOCRIT: 44.6 % (ref 39.0–52.0)
Hemoglobin: 14.7 g/dL (ref 13.0–17.0)
MCH: 27.5 pg (ref 26.0–34.0)
MCHC: 33 g/dL (ref 30.0–36.0)
MCV: 83.4 fL (ref 78.0–100.0)
PLATELETS: 358 10*3/uL (ref 150–400)
RBC: 5.35 MIL/uL (ref 4.22–5.81)
RDW: 14.2 % (ref 11.5–15.5)
WBC: 6.8 10*3/uL (ref 4.0–10.5)

## 2015-10-20 LAB — RAPID URINE DRUG SCREEN, HOSP PERFORMED
Amphetamines: NOT DETECTED
BARBITURATES: NOT DETECTED
BENZODIAZEPINES: NOT DETECTED
Cocaine: POSITIVE — AB
Opiates: NOT DETECTED
Tetrahydrocannabinol: NOT DETECTED

## 2015-10-20 LAB — SALICYLATE LEVEL: Salicylate Lvl: <4 mg/dL (ref 2.8–30.0)

## 2015-10-20 LAB — ETHANOL

## 2015-10-20 LAB — ACETAMINOPHEN LEVEL: Acetaminophen (Tylenol), Serum: <10 ug/mL — ABNORMAL LOW (ref 10–30)

## 2015-10-20 NOTE — ED Triage Notes (Signed)
Pt. reports suicidal ideation plans to overdose on medications , denies hallucinations .  

## 2015-10-20 NOTE — ED Notes (Signed)
Staffing office and charge nurse notified for pt.'s sitter , purple scrubs given to pt. , security notified to wand pt.  

## 2015-10-21 ENCOUNTER — Emergency Department (HOSPITAL_COMMUNITY)
Admission: EM | Admit: 2015-10-21 | Discharge: 2015-10-21 | Disposition: A | Discharge status: Other Acute Inpatient Hospital | Payer: Self-pay | Attending: Emergency Medicine | Admitting: Emergency Medicine

## 2015-10-21 ENCOUNTER — Inpatient Hospital Stay (HOSPITAL_COMMUNITY)
Admission: AD | Admit: 2015-10-21 | Discharge: 2015-10-28 | DRG: 885 | Disposition: A | Discharge status: Home / Self Care | Payer: Federal, State, Local not specified - Other | Source: Intra-hospital | Attending: Psychiatry | Admitting: Psychiatry

## 2015-10-21 ENCOUNTER — Encounter (HOSPITAL_COMMUNITY): Payer: Self-pay | Admitting: *Deleted

## 2015-10-21 DIAGNOSIS — Z8673 Personal history of transient ischemic attack (TIA), and cerebral infarction without residual deficits: Secondary | ICD-10-CM

## 2015-10-21 DIAGNOSIS — Z833 Family history of diabetes mellitus: Secondary | ICD-10-CM | POA: Diagnosis not present

## 2015-10-21 DIAGNOSIS — M545 Low back pain: Secondary | ICD-10-CM | POA: Diagnosis present

## 2015-10-21 DIAGNOSIS — G4700 Insomnia, unspecified: Secondary | ICD-10-CM | POA: Diagnosis present

## 2015-10-21 DIAGNOSIS — F419 Anxiety disorder, unspecified: Secondary | ICD-10-CM | POA: Diagnosis present

## 2015-10-21 DIAGNOSIS — Z62810 Personal history of physical and sexual abuse in childhood: Secondary | ICD-10-CM | POA: Diagnosis present

## 2015-10-21 DIAGNOSIS — E119 Type 2 diabetes mellitus without complications: Secondary | ICD-10-CM | POA: Diagnosis present

## 2015-10-21 DIAGNOSIS — Z915 Personal history of self-harm: Secondary | ICD-10-CM

## 2015-10-21 DIAGNOSIS — I10 Essential (primary) hypertension: Secondary | ICD-10-CM | POA: Diagnosis present

## 2015-10-21 DIAGNOSIS — F0630 Mood disorder due to known physiological condition, unspecified: Secondary | ICD-10-CM

## 2015-10-21 DIAGNOSIS — F250 Schizoaffective disorder, bipolar type: Secondary | ICD-10-CM | POA: Diagnosis present

## 2015-10-21 DIAGNOSIS — R51 Headache: Secondary | ICD-10-CM | POA: Diagnosis present

## 2015-10-21 DIAGNOSIS — R45851 Suicidal ideations: Secondary | ICD-10-CM

## 2015-10-21 DIAGNOSIS — F1420 Cocaine dependence, uncomplicated: Secondary | ICD-10-CM | POA: Diagnosis present

## 2015-10-21 DIAGNOSIS — K219 Gastro-esophageal reflux disease without esophagitis: Secondary | ICD-10-CM | POA: Diagnosis present

## 2015-10-21 DIAGNOSIS — Z9114 Patient's other noncompliance with medication regimen: Secondary | ICD-10-CM

## 2015-10-21 DIAGNOSIS — F1994 Other psychoactive substance use, unspecified with psychoactive substance-induced mood disorder: Secondary | ICD-10-CM

## 2015-10-21 DIAGNOSIS — F17210 Nicotine dependence, cigarettes, uncomplicated: Secondary | ICD-10-CM | POA: Diagnosis present

## 2015-10-21 DIAGNOSIS — Z8249 Family history of ischemic heart disease and other diseases of the circulatory system: Secondary | ICD-10-CM

## 2015-10-21 DIAGNOSIS — Z818 Family history of other mental and behavioral disorders: Secondary | ICD-10-CM | POA: Diagnosis not present

## 2015-10-21 LAB — ACETAMINOPHEN LEVEL

## 2015-10-21 LAB — CBG MONITORING, ED: Glucose-Capillary: 134 mg/dL — ABNORMAL HIGH (ref 65–99)

## 2015-10-21 MED ORDER — IBUPROFEN 800 MG PO TABS
800.0000 mg | ORAL_TABLET | Freq: Three times a day (TID) | ORAL | Status: DC
  Administered 2015-10-21 – 2015-10-23 (×5): 800 mg via ORAL
  Filled 2015-10-21 (×9): qty 1

## 2015-10-21 MED ORDER — NICOTINE 21 MG/24HR TD PT24
21.0000 mg | MEDICATED_PATCH | Freq: Every day | TRANSDERMAL | Status: DC
Start: 2015-10-22 — End: 2015-10-28
  Administered 2015-10-22 – 2015-10-25 (×4): 21 mg via TRANSDERMAL
  Filled 2015-10-21 (×9): qty 1
  Filled 2015-10-21: qty 20
  Filled 2015-10-21: qty 1

## 2015-10-21 MED ORDER — DULOXETINE HCL 20 MG PO CPEP
20.0000 mg | ORAL_CAPSULE | Freq: Two times a day (BID) | ORAL | Status: DC
Start: 2015-10-21 — End: 2015-10-22
  Administered 2015-10-21 – 2015-10-22 (×2): 20 mg via ORAL
  Filled 2015-10-21 (×6): qty 1

## 2015-10-21 MED ORDER — ACETAMINOPHEN 325 MG PO TABS
650.0000 mg | ORAL_TABLET | Freq: Four times a day (QID) | ORAL | Status: DC | PRN
  Administered 2015-10-27: 650 mg via ORAL
  Filled 2015-10-21 (×2): qty 2

## 2015-10-21 MED ORDER — ALUM & MAG HYDROXIDE-SIMETH 200-200-20 MG/5ML PO SUSP
30.0000 mL | ORAL | Status: DC | PRN
  Administered 2015-10-24: 30 mL via ORAL
  Filled 2015-10-21: qty 30

## 2015-10-21 MED ORDER — HYDROXYZINE HCL 25 MG PO TABS
25.0000 mg | ORAL_TABLET | Freq: Four times a day (QID) | ORAL | Status: DC | PRN
  Administered 2015-10-21 – 2015-10-27 (×2): 25 mg via ORAL
  Filled 2015-10-21: qty 20
  Filled 2015-10-21 (×2): qty 1

## 2015-10-21 MED ORDER — MAGNESIUM HYDROXIDE 400 MG/5ML PO SUSP: 30.0000 mL | Freq: Every day | ORAL | Status: DC | PRN

## 2015-10-21 MED ORDER — QUETIAPINE FUMARATE 400 MG PO TABS
400.0000 mg | ORAL_TABLET | Freq: Every day | ORAL | Status: DC
  Administered 2015-10-21 – 2015-10-22 (×2): 400 mg via ORAL
  Filled 2015-10-21: qty 1
  Filled 2015-10-21: qty 2
  Filled 2015-10-21 (×3): qty 1

## 2015-10-21 MED ORDER — GABAPENTIN 100 MG PO CAPS
200.0000 mg | ORAL_CAPSULE | Freq: Three times a day (TID) | ORAL | Status: DC
  Administered 2015-10-22 – 2015-10-23 (×4): 200 mg via ORAL
  Filled 2015-10-21 (×11): qty 2

## 2015-10-21 NOTE — ED Notes (Signed)
Pt given Sprite and mustard as requested for late 1500 snack.

## 2015-10-21 NOTE — ED Notes (Signed)
Requested for pharmacy tech to perform med rec.

## 2015-10-21 NOTE — ED Notes (Signed)
TTS device at bedside per [facility] request.  

## 2015-10-21 NOTE — ED Notes (Signed)
Kenney HousemanLindsay, AC, [facility] - aware pt ambulatory w/no assistance and that pt states he does not use cane/walker or any other equipment. States is able to perform own ADL's.

## 2015-10-21 NOTE — ED Notes (Signed)
[facility] and voiced agreement w/tx plan. [facility], copy sent to [facility] Records, and original [facility].

## 2015-10-21 NOTE — ED Notes (Signed)
Per Kenney HousemanLindsay, AC, [facility] Dr Elna BreslowEappen - after 1730.
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
## 2015-10-21 NOTE — ED Notes (Signed)
Pt on phone at nurses' desk. 
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
## 2015-10-21 NOTE — ED Notes (Signed)
Pt's belongings - 2 labeled belongings bags at nurses' desk - to be inventoried.

## 2015-10-21 NOTE — ED Notes (Signed)
Pt given mustard and water as requested d/t c/o leg cramps. Pt on phone at nurses' desk.

## 2015-10-21 NOTE — Tx Team (Signed)
Initial Treatment Plan 10/21/2015 9:59 PM Lonnie JunesHoward J [name] [id number]    PATIENT STRESSORS: Marital or family conflict Substance abuse   PATIENT STRENGTHS: Motivation for treatment/growth Physical Health Religious Affiliation Supportive family/friends   PATIENT IDENTIFIED PROBLEMS: At risk for suicide  Depression  "Getting my faith better"  "Listening"               DISCHARGE CRITERIA:  Ability to meet basic life and health needs Improved stabilization in mood, thinking, and/or behavior Motivation to continue treatment in a less acute level of care Need for constant or close observation no longer present Reduction of life-threatening or endangering symptoms to within safe limits Withdrawal symptoms are absent or subacute and managed without 24-hour nursing intervention  PRELIMINARY DISCHARGE PLAN: Attend 12-step recovery group Outpatient therapy Return to previous living arrangement Return to previous work or school arrangements  PATIENT/FAMILY INVOLVEMENT: This treatment plan has been presented to and reviewed with the patient, Lonnie [name].  The patient and family have been given the opportunity to ask questions and make suggestions.  Carleene OverlieMiddleton, Staley Budzinski P, RN 10/21/2015, 9:59 PM

## 2015-10-21 NOTE — ED Notes (Signed)
Patient was given a snack and drink, and A regular diet was ordered for lunch. 

## 2015-10-21 NOTE — ED Notes (Signed)
[facility] but facility is at capacity. Will begin searching for placement.

## 2015-10-21 NOTE — ED Notes (Signed)
States cramps have eased off.

## 2015-10-22 ENCOUNTER — Encounter (HOSPITAL_COMMUNITY): Payer: Self-pay | Admitting: Psychiatry

## 2015-10-22 DIAGNOSIS — R45851 Suicidal ideations: Secondary | ICD-10-CM

## 2015-10-22 DIAGNOSIS — F1420 Cocaine dependence, uncomplicated: Secondary | ICD-10-CM | POA: Diagnosis present

## 2015-10-22 DIAGNOSIS — F250 Schizoaffective disorder, bipolar type: Principal | ICD-10-CM

## 2015-10-22 LAB — LIPID PANEL
Cholesterol: 187 mg/dL (ref 0–200)
HDL: 59 mg/dL (ref >40)
LDL CALC: 108 mg/dL — AB (ref 0–99)
TRIGLYCERIDES: 102 mg/dL (ref <150)
Total CHOL/HDL Ratio: 3.2 RATIO
VLDL: 20 mg/dL (ref 0–40)

## 2015-10-22 LAB — BASIC METABOLIC PANEL
Anion gap: 9 (ref 5–15)
BUN: 12 mg/dL (ref 6–20)
CALCIUM: 8.8 mg/dL — AB (ref 8.9–10.3)
CO2: 23 mmol/L (ref 22–32)
CREATININE: 1.16 mg/dL (ref 0.61–1.24)
Chloride: 107 mmol/L (ref 101–111)
GFR calc Af Amer: >60 mL/min (ref >60)
GLUCOSE: 88 mg/dL (ref 65–99)
Potassium: 4.3 mmol/L (ref 3.5–5.1)
Sodium: 139 mmol/L (ref 135–145)

## 2015-10-22 LAB — TSH: TSH: 1.285 u[IU]/mL (ref 0.350–4.500)

## 2015-10-22 MED ORDER — DIVALPROEX SODIUM ER 250 MG PO TB24
750.0000 mg | ORAL_TABLET | Freq: Every day | ORAL | Status: DC
  Administered 2015-10-22 – 2015-10-25 (×4): 750 mg via ORAL
  Filled 2015-10-22 (×6): qty 3

## 2015-10-22 NOTE — [facility] Suicide Risk Assessment (Signed)
[facility]   Nursing information obtained from:  Patient Demographic factors:  Male, Unemployed Current Mental Status:  Suicidal ideation indicated by patient, Suicide plan, Plan includes specific time, place, or method, Self-harm thoughts, Self-harm behaviors, Intention to act on suicide plan, Belief that plan would result in death Loss Factors:  Legal issues Historical Factors:  Prior suicide attempts, Family history of mental illness or substance abuse Risk Reduction Factors:  Living with another person, especially a relative, Positive social support  Total Time spent with patient: 30 minutes Principal Problem: Schizoaffective disorder, bipolar type (HCC) Diagnosis:   Patient Active Problem List   Diagnosis Date Noted  . Cocaine use disorder, severe, dependence (HCC) [F14.20] 10/22/2015  . Schizoaffective disorder, bipolar type (HCC) [F25.0] 04/08/2015  . TIA (transient ischemic attack) [G45.9] 08/09/2013  . Hematemesis [K92.0] 07/30/2013  . GI bleed [K92.2] 07/30/2013   Subjective Data:Please see H&P.   Continued Clinical Symptoms:  Alcohol Use Disorder Identification Test Final Score (AUDIT): 4 The "Alcohol Use Disorders Identification Test", Guidelines for Use in Primary Care, Second Edition.  World Science writerHealth Organization [facility](WHO). Score between 0-7:  no or low risk or alcohol related problems. Score between 8-15:  moderate risk of alcohol related problems. Score between 16-19:  high risk of alcohol related problems. Score 20 or above:  warrants further diagnostic evaluation for alcohol dependence and treatment.   CLINICAL FACTORS:   Alcohol/Substance Abuse/Dependencies More than one psychiatric diagnosis Currently Psychotic Unstable or Poor Therapeutic Relationship   Musculoskeletal: Strength & Muscle Tone: within normal limits Gait & Station: normal Patient leans: N/A  Psychiatric Specialty Exam: Physical Exam  Review of Systems   Psychiatric/Behavioral: Positive for depression, hallucinations, substance abuse and suicidal ideas. The patient is nervous/anxious.   All other systems reviewed and are negative.   Blood pressure 115/75, pulse 85, temperature 97.8 F (36.6 C), temperature source Oral, resp. rate 18, height 5\' 11"  (1.803 m), weight 93.4 kg (206 lb), SpO2 100 %.Body mass index is 28.73 kg/m.          Please see H&P.                                                 COGNITIVE FEATURES THAT CONTRIBUTE TO RISK:  Closed-mindedness, Polarized thinking and Thought constriction (tunnel vision)    SUICIDE RISK:   Severe:  Frequent, intense, and enduring suicidal ideation, specific plan, no subjective intent, but some objective markers of intent (i.e., choice of lethal method), the method is accessible, some limited preparatory behavior, evidence of impaired self-control, severe dysphoria/symptomatology, multiple risk factors present, and few if any protective factors, particularly a lack of social support.   PLAN OF CARE: Please see H&P.   I certify that inpatient services furnished can reasonably be expected to improve the patient's condition.  Edris Friedt, MD 10/22/2015, 11:45 AM

## 2015-10-22 NOTE — Progress Notes (Signed)
DAR NOTE: Pt present with flat affect and depressed mood in the unit. Pt has been isolating himself and has been bed most of the time. Pt denies physical pain, took all his meds as scheduled. As per self inventory, pt had a good night sleep, good appetite, normal energy, and good concentration. Pt rate depression at 5, hopeless ness at 6, and anxiety at 6. Pt's safety ensured with 15 minute and environmental checks. Pt currently denies SI/HI and A/V hallucinations. Pt verbally agrees to seek staff if SI/HI or A/VH occurs and to consult with staff before acting on these thoughts. Will continue POC.

## 2015-10-22 NOTE — [facility] Suicide Risk Assessment (Signed)
[facility] INPATIENT:  Family/Significant Other Suicide Prevention Education  Suicide Prevention Education:  Education Completed;Lonnie Fayrene FearingJames (aunt, [phone]) has been identified by the patient as the family member/significant other with whom the patient will be residing, and identified as the person(s) who will aid the patient in the event of a mental health crisis (suicidal ideations/suicide attempt).  With written consent from the patient, the family member/significant other has been provided the following suicide prevention education, prior to the and/or following the discharge of the patient.  The suicide prevention education provided includes the following:  Suicide risk factors  Suicide prevention and interventions  National Suicide Hotline telephone number  [facility] assessment telephone number  [facility] Assistance 911  [facility] and/or Residential Mobile Crisis Unit telephone number  Request made of family/significant other to:  Remove weapons (e.g., guns, rifles, knives), all items previously/currently identified as safety concern.    Remove drugs/medications (over-the-counter, prescriptions, illicit drugs), all items previously/currently identified as a safety concern.  The family member/significant other verbalizes understanding of the suicide prevention education information provided.  The family member/significant other agrees to remove the items of safety concern listed above.  Lonnie [name] 10/22/2015, 11:20 AM
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
## 2015-10-22 NOTE — [facility] Group Notes (Signed)
[facility] LCSW Group Therapy  10/22/2015 2:39 PM   Type of Therapy:  Group Therapy   Participation Level:  Engaged  Participation Quality:  Attentive  Affect:  Appropriate   Cognitive:  Alert   Insight:  Engaged  Engagement in Therapy:  Improving   Modes of Intervention:  Education, Exploration, Socialization   Summary of Progress/Problems: Lonnie [name] was invited to group, chose not to attend.   Lonnie [name] from [facility] [employer] was here to tell his story of recovery, inform patients about [facility] and play his guitar.   Lonnie [name] 10/22/2015 2:39 PM
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
## 2015-10-22 NOTE — Tx Team (Signed)
Interdisciplinary Treatment and Diagnostic Plan Update  10/22/2015 Time of Session: 3:06 PM  Lonnie [name] MRN: [id number]  Principal Diagnosis: Schizoaffective disorder, bipolar type [facility])  Secondary Diagnoses: Principal Problem:   Schizoaffective disorder, bipolar type ([place]) Active Problems:   Cocaine use disorder, severe, dependence ([place])   Current Medications:  Current Facility-Administered Medications  Medication Dose Route Frequency Provider Last Rate Last Dose  . acetaminophen (TYLENOL) tablet 650 mg  650 mg Oral Q6H PRN Laverle Hobby, PA-C      . alum & mag hydroxide-simeth (MAALOX/MYLANTA) 200-200-20 MG/5ML suspension 30 mL  30 mL Oral Q4H PRN Laverle Hobby, PA-C      . divalproex (DEPAKOTE ER) 24 hr tablet 750 mg  750 mg Oral QHS Saramma Eappen, MD      . gabapentin (NEURONTIN) capsule 200 mg  200 mg Oral TID Laverle Hobby, PA-C   200 mg at 10/22/15 1211  . hydrOXYzine (ATARAX/VISTARIL) tablet 25 mg  25 mg Oral Q6H PRN Laverle Hobby, PA-C   25 mg at 10/21/15 2259  . ibuprofen (ADVIL,MOTRIN) tablet 800 mg  800 mg Oral TID Laverle Hobby, PA-C   800 mg at 10/22/15 1211  . magnesium hydroxide (MILK OF MAGNESIA) suspension 30 mL  30 mL Oral Daily PRN Laverle Hobby, PA-C      . nicotine (NICODERM CQ - dosed in mg/24 hours) patch 21 mg  21 mg Transdermal Daily Laverle Hobby, PA-C   21 mg at 10/22/15 0843  . QUEtiapine (SEROQUEL) tablet 400 mg  400 mg Oral QHS Laverle Hobby, PA-C   400 mg at 10/21/15 2245    PTA Medications: Prescriptions Prior to Admission  Medication Sig Dispense Refill Last Dose  . FLUoxetine (PROZAC) 20 MG capsule Take 1 capsule (20 mg total) by mouth daily. For depression 30 capsule 0 10/17/2015  . hydrOXYzine (ATARAX/VISTARIL) 25 MG tablet Take 1 tablet (25 mg total) by mouth every 6 (six) hours as needed for anxiety (Sleep). 60 tablet 0 10/17/2015  . ibuprofen (ADVIL,MOTRIN) 800 MG tablet Take 1 tablet (800 mg total) by mouth 3 (three) times  daily. 21 tablet 0 10/16/2015  . QUEtiapine (SEROQUEL) 400 MG tablet Take 1 tablet (400 mg total) by mouth at bedtime. For mood control 30 tablet 0 10/07/2015    Treatment Modalities: Medication Management, Group therapy, Case management,  1 to 1 session with clinician, Psychoeducation, Recreational therapy.   Physician Treatment Plan for Primary Diagnosis: Schizoaffective disorder, bipolar type ([place]) Long Term Goal(s): Improvement in symptoms so as ready for discharge  Short Term Goals: Compliance with prescribed medications will improve  Medication Management: Evaluate patient's response, side effects, and tolerance of medication regimen.  Therapeutic Interventions: 1 to 1 sessions, Unit Group sessions and Medication administration.  Evaluation of Outcomes: Not Met  Physician Treatment Plan for Secondary Diagnosis: Principal Problem:   Schizoaffective disorder, bipolar type ([place]) Active Problems:   Cocaine use disorder, severe, dependence ([place])   Long Term Goal(s): Improvement in symptoms so as ready for discharge  Short Term Goals: Ability to identify triggers associated with substance abuse/mental health issues will improve  Medication Management: Evaluate patient's response, side effects, and tolerance of medication regimen.  Therapeutic Interventions: 1 to 1 sessions, Unit Group sessions and Medication administration.  Evaluation of Outcomes: Not Met   RN Treatment Plan for Primary Diagnosis: Schizoaffective disorder, bipolar type ([place]) Long Term Goal(s): Knowledge of disease and therapeutic regimen to maintain health will improve  Short Term  Goals: Ability to disclose and discuss suicidal ideas and Compliance with prescribed medications will improve  Medication Management: RN will administer medications as ordered by provider, will assess and evaluate patient's response and provide education to patient for prescribed medication. RN will report any adverse and/or side  effects to prescribing provider.  Therapeutic Interventions: 1 on 1 counseling sessions, Psychoeducation, Medication administration, Evaluate responses to treatment, Monitor vital signs and CBGs as ordered, Perform/monitor CIWA, COWS, AIMS and Fall Risk screenings as ordered, Perform wound care treatments as ordered.  Evaluation of Outcomes: Not Met   LCSW Treatment Plan for Primary Diagnosis: Schizoaffective disorder, bipolar type ([place]) Long Term Goal(s): Safe transition to appropriate next level of care at discharge, Engage patient in therapeutic group addressing interpersonal concerns.  Short Term Goals: Increase emotional regulation and Identify triggers associated with mental health/substance abuse issues  Therapeutic Interventions: Assess for all discharge needs, 1 to 1 time with Social worker, Explore available resources and support systems, Assess for adequacy in community support network, Educate family and significant other(s) on suicide prevention, Complete Psychosocial Assessment, Interpersonal group therapy.  Evaluation of Outcomes: Not Met   Progress in Treatment: Attending groups: Yes Participating in groups: Yes Taking medication as prescribed: Yes, MD continues to assess for medication changes as needed Toleration medication: Yes, no side effects reported at this time Family/Significant other contact made: Yes Patient understands diagnosis: Yes, by requesting treatment for Depression, SI, AH and referrals to inpatient rehab.  Discussing patient identified problems/goals with staff: Yes Medical problems stabilized or resolved: Yes Denies suicidal/homicidal ideation: No, patient endorses SI Issues/concerns per patient self-inventory: None Other: N/A  New problem(s) identified: None identified at this time.   New Short Term/Long Term Goal(s): None identified at this time.   Discharge Plan or Barriers: Patient is hoping to discharge to an inpatient rehab  facility.  Reason for Continuation of Hospitalization: Anxiety Depression Hallucinations  Medication stabilization Suicidal ideation  Estimated Length of Stay: 3-5 days  Attendees: Patient: 10/22/2015  3:06 PM  Physician: Dr. Shea Evans 10/22/2015  3:06 PM  Nursing: Opal Sidles 10/22/2015  3:06 PM  RN Care Manager: Lars Pinks 10/22/2015  3:06 PM  Social Worker: Ripley Fraise, [place] 10/22/2015  3:06 PM  Recreational Therapist: Winfield Cunas 10/22/2015  3:06 PM  Other: Radonna Ricker, Social Work Intern  10/22/2015  3:06 PM  Other:  10/22/2015  3:06 PM  Other: 10/22/2015  3:06 PM    Scribe for Treatment Team: Radonna Ricker, Social Work Intern 10/22/2015 3:06 PM

## 2015-10-23 LAB — HEMOGLOBIN A1C
Hgb A1c MFr Bld: 5.5 % (ref 4.8–5.6)
Mean Plasma Glucose: 111 mg/dL

## 2015-10-23 LAB — PROLACTIN: Prolactin: 21.1 ng/mL — ABNORMAL HIGH (ref 4.0–15.2)

## 2015-10-23 MED ORDER — QUETIAPINE FUMARATE 50 MG PO TABS
450.0000 mg | ORAL_TABLET | Freq: Every day | ORAL | Status: DC
  Administered 2015-10-23 – 2015-10-27 (×5): 450 mg via ORAL
  Filled 2015-10-23 (×7): qty 1

## 2015-10-23 MED ORDER — IBUPROFEN 800 MG PO TABS
800.0000 mg | ORAL_TABLET | Freq: Three times a day (TID) | ORAL | Status: DC | PRN
  Administered 2015-10-24 – 2015-10-28 (×3): 800 mg via ORAL
  Filled 2015-10-23 (×3): qty 1

## 2015-10-23 MED ORDER — GABAPENTIN 300 MG PO CAPS
300.0000 mg | ORAL_CAPSULE | ORAL | Status: DC
  Administered 2015-10-23 – 2015-10-28 (×15): 300 mg via ORAL
  Filled 2015-10-23 (×10): qty 1
  Filled 2015-10-23: qty 60
  Filled 2015-10-23 (×2): qty 1
  Filled 2015-10-23: qty 60
  Filled 2015-10-23 (×6): qty 1
  Filled 2015-10-23: qty 60

## 2015-10-23 NOTE — Progress Notes (Signed)
   D: Pt was in the dayroom watching tv and interacting with his peers. Informed the writer that he "tossed and turned most of the night". Stated, "I did my best to stay up today so I can sleep tonight". Writer encouraged pt to inform staff tonight if he has difficulty sleeping after scheduled or prn sleep meds. Pt has no  other questions or concerns.   A:  Support and encouragement was offered. 15 min checks continued for safety.  R: Pt remains safe.

## 2015-10-23 NOTE — Progress Notes (Signed)
   D: When asked about his day pt stated, "I'm not having any pain". Pt denied A/H as well as SI. Stated, "not right now".  Pt has no questions or concerns.    A:  Support and encouragement was offered. 15 min checks continued for safety.  R: Pt remains safe.
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
## 2015-10-23 NOTE — [facility] Group Notes (Signed)
Type of Therapy: Group Therapy   Participation Level: Engaged  Participation Quality: Attentive  Affect: Flat    Cognitive: Alert   Insight: Engaged  Engagement in Therapy: Improving   Modes of Intervention: Education, Exploration, Socialization   [employer] CorporationPlayed Wheel of Fortune, spelling the word obstacle. Group members were asked to identify an obstacle/obstacles they are currently facing and also first steps in overcoming those obstacles   Summary of Progress/Problems: Lonnie [name] was engaged throughout, and stayed entire time. Lonnie [name] stated that his biggest obstacle going forward is overcoming setbacks such as alcohol and drugs. Lonnie [name] stated that in order to overcome this obstacle he will put his past behind him and not allow his past to define him.

## 2015-10-23 NOTE — Progress Notes (Signed)
Nursing Note 10/23/2015 [id number]  Data Reports sleeping good with PRN sleep med.  Rates depression 1/10, hopelessness 0/10, and anxiety 8/10. Affect depressed mood "anxious."  Denies HI, SI, AVH.  C/O headache this AM 10/10.   Denies other physical complaints.  Stayed in bed in AM, stated he would attend groups in the afternoon.   Action Spoke with patient 1:1, nurse offered support to patient throughout shift.  Encouraged to get out of room in afternoon.  Scheduled Ibuprofen given, changed to PRN per MD.  Continues to be monitored on 15 minute checks for safety.  Response Ibuprofen effective.  Remains safe and appropriate on unit.

## 2015-10-24 MED ORDER — TRAZODONE HCL 50 MG PO TABS
50.0000 mg | ORAL_TABLET | Freq: Every evening | ORAL | Status: DC | PRN
  Administered 2015-10-28: 50 mg via ORAL
  Filled 2015-10-24: qty 1
  Filled 2015-10-24: qty 20

## 2015-10-24 NOTE — [facility] Group Notes (Signed)
[facility] Group Notes:  (Nursing/MHT/Case Management/Adjunct)  Date:  10/24/2015  Time:  9:33 PM  Type of Therapy:  Psychoeducational Skills  Participation Level:  Active  Participation Quality:  Appropriate and Sharing  Affect:  Anxious, Depressed and Flat  Cognitive:  Alert and Appropriate  Insight:  Good  Engagement in Group:  Limited  Modes of Intervention:  Clarification, Discussion and Education  Summary of Progress/Problems: Pt. Reports his goal was to get out of his room  And participate in group< Not fall into depression stage:. Pt. States "Here I am, and I do feel better".  Pt stated that he is concerned what people on the outside will think of him.   We discussed the pt.'s needs and self worth.  Rated his day a 6, his depression a 6 and anxiety an 8. Cooper RenderSadler, Cathryn Gallery Jean Horne 10/24/2015, 9:33 PM
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
## 2015-10-24 NOTE — [facility] Group Notes (Signed)
[facility] LCSW Group Therapy  10/24/2015 3:50 PM  Type of Therapy:  Group Therapy  Participation Level:  Did Not Attend  Modes of Intervention:  Discussion, Education, Socialization and Support  Summary of Progress/Problems:: Chaplin facilitated discussion about hope. Patients defined hope and what hope means to them.   Lailee Hoelzel L Sophira Rumler MSW, LCSWA  10/24/2015, 3:50 PM   

## 2015-10-24 NOTE — [facility] Group Notes (Signed)
[facility] LCSW Aftercare Discharge Planning Group Note   10/24/2015 11:13 AM  Participation Quality:  Did not attend.   Surah Pelley L Jermie Hippe MSW, LCSWA     

## 2015-10-24 NOTE — Progress Notes (Signed)
Nursing Note 10/24/2015 [id number]  Data Reports sleeping poorly.  Reports ongoing anxiety, about the same as yesterday.  Denies physical complaints.  Did not complete self-inventory sheet. Affect blunted but appropriate mood "anxious and depressed."  Denies HI, SI, AVH.  C/O lower back pain this evering   No other physical complaints.  Complained this evening "I don't know why I am so depressed.  He has been more interactive with peers and attending groups.  Action Spoke with patient 1:1, nurse offered support to patient throughout shift.  Given ibuprofen .  Continues to be monitored on 15 minute checks for safety.  Response Ibuprofen effective.  Remains safe and appropriate on unit, though reports ongoing depression.

## 2015-10-25 NOTE — Progress Notes (Signed)
Nursing Note 10/25/2015 [id number]  Data Reports sleeping well; however, reported having "crazy dreams" about using last night, and states when he woke up he felt like he was getting ready to leave and use drugs.   Rates depression 6/10, hopelessness 6/10, and anxiety 8/10. Affect blunted, depressed mood "depressed and anxious."  Denies HI, SI, AVH.  Denies physical complaints today "my back pain is completely gone."  Attending groups, keeps to self in day area. Goal today to  try to stay awake, reports feeling tired.  Napped this afternoon.  Action Spoke with patient 1:1, nurse offered support to patient throughout shift.  Suggested patient try taking off nicotine patch at bedtime.  Continues to be monitored on 15 minute checks for safety.  Response Remains safe and appropriate on unit.

## 2015-10-25 NOTE — [facility] Group Notes (Signed)
Adult Psychoeducational Group Note  Date:  10/25/2015 Time:  0930-1000  Group Topic/Focus:  Goals Group:   The focus of this group is to help patients establish daily goals to achieve during treatment and discuss how the patient can incorporate goal setting into their daily lives to aide in recovery.  We also discussed healthy coping skills.   Participation Level:  Active  Participation Quality:  Appropriate  Affect:  Appropriate  Cognitive:  Alert and Appropriate  Insight: Appropriate  Engagement in Group:  Engaged  Modes of Intervention:  Discussion, Education, Rapport Building and Support  Additional Comments:  Patient attended, shared his goal which is to attend meetings.  Patient contributed a coping skill to the list of coping skills on the board.  Lindajo RoyalDaniel P Jamille Yoshino 10/25/2015, 11:47 AM
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
## 2015-10-25 NOTE — Progress Notes (Signed)
  D: Pt informed the writer that he "feels better today." Stated, "the depression seems better. Just trying to keep going". Pt appeared to be more withdrawn today than previous day. Pt has no questions or concerns.    A:  Support and encouragement was offered. 15 min checks continued for safety.  R: Pt remains safe.

## 2015-10-25 NOTE — Progress Notes (Signed)
Adult Psychoeducational Group Note  Date:  10/25/2015 Time:  10:11 PM  Group Topic/Focus:  Wrap-Up Group:   The focus of this group is to help patients review their daily goal of treatment and discuss progress on daily workbooks.   Participation Level:  Active  Participation Quality:  Appropriate and Attentive  Affect:  Appropriate  Cognitive:  Appropriate  Insight: Appropriate  Engagement in Group:  Engaged  Modes of Intervention:  Discussion  Additional Comments:  Pt stated his goal for today was to stay up. Pt stated he had a good day today and he made it to a couple groups today. Caswell CorwinOwen, Lanyah Spengler C 10/25/2015, 10:11 PM

## 2015-10-25 NOTE — [facility] Group Notes (Signed)
[facility] Group Notes: (Clinical Social Work)   10/25/2015      Type of Therapy:  Group Therapy   Participation Level:  Did Not Attend despite MHT prompting   Alaisha Eversley Grossman-Orr, LCSW 10/25/2015, 1:22 PM     

## 2015-10-26 LAB — VALPROIC ACID LEVEL: Valproic Acid Lvl: 32 ug/mL — ABNORMAL LOW (ref 50.0–100.0)

## 2015-10-26 MED ORDER — DIVALPROEX SODIUM ER 500 MG PO TB24
1000.0000 mg | ORAL_TABLET | Freq: Every day | ORAL | Status: DC
  Administered 2015-10-26 – 2015-10-27 (×2): 1000 mg via ORAL
  Filled 2015-10-26: qty 40
  Filled 2015-10-26 (×3): qty 2
  Filled 2015-10-26: qty 40
  Filled 2015-10-26: qty 2

## 2015-10-26 NOTE — Plan of Care (Signed)
Problem: Education: Goal: Utilization of techniques to improve thought processes will improve Outcome: Progressing Nurse discussed depression/coping skills with patient.    

## 2015-10-26 NOTE — [facility] Group Notes (Signed)
[facility] Group Notes:  (Clinical Social Work)  10/26/2015  11:00AM-12:00PM  Summary of Progress/Problems:  The main focus of today's process group was to listen to a variety of genres of music and to identify that different types of music provoke different responses.  The patient then was able to identify personally what was soothing for them, as well as energizing, as well as how patient can personally use this knowledge in sleep habits, with depression, and with other symptoms.  The patient expressed at the beginning of group that his medications were making him very drowsy, and he did indeed go to sleep a few times, sat for much of group with his eyes closed.  However, he did answer questions much of the time his eyes were closed.  He was able to share his feelings about some of the music that made him relaxed, inspired, and sleepy.  Type of Therapy:  Music Therapy   Participation Level:  Active  Participation Quality:  Attentive and Drowsy  Affect:  Blunted  Cognitive:  Oriented  Insight:  Engaged  Engagement in Therapy:  Engaged  Modes of Intervention:   Activity, Exploration  [name], LCSW 10/26/2015
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
## 2015-10-26 NOTE — Progress Notes (Signed)
D:  Patient denied SI and HI while talking to nurse this morning.  Contracts for safety.  Denied A/V hallucinations.  A: Medications administered per MD orders.  Emotional support and encouragement given patient. R:  Safety maintained with 15 minute checks.  Patient told NP this morning that he does hear voices, but patient denied A/V hallucinations to nurse this morning.

## 2015-10-26 NOTE — [facility] Group Notes (Signed)
The focus of this group is to educate the patient on the purpose and policies of crisis stabilization and provide a format to answer questions about their admission.  The group details unit policies and expectations of patients while admitted.  Patient did not attend 0900 nurse education orientation group this morning.  Patient stayed in bed.   

## 2015-10-27 NOTE — [facility] Group Notes (Signed)
Patient did not attend group.

## 2015-10-27 NOTE — Tx Team (Signed)
Interdisciplinary Treatment and Diagnostic Plan Update  10/27/2015 Time of Session: 8:44 AM  Lonnie JunesHoward J [name] MRN: [id number]  Principal Diagnosis: Schizoaffective disorder, bipolar type (HCC)  Secondary Diagnoses: Principal Problem:   Schizoaffective disorder, bipolar type (HCC) Active Problems:   Cocaine use disorder, severe, dependence (HCC)   Current Medications:  Current Facility-Administered Medications  Medication Dose Route Frequency Provider Last Rate Last Dose  . acetaminophen (TYLENOL) tablet 650 mg  650 mg Oral Q6H PRN Kerry HoughSpencer E Simon, PA-C   650 mg at 10/27/15 [id number]  . alum & mag hydroxide-simeth (MAALOX/MYLANTA) 200-200-20 MG/5ML suspension 30 mL  30 mL Oral Q4H PRN Kerry HoughSpencer E Simon, PA-C   30 mL at 10/24/15 2135  . divalproex (DEPAKOTE ER) 24 hr tablet 1,000 mg  1,000 mg Oral QHS Oneta Rackanika N Lewis, NP   1,000 mg at 10/26/15 2304  . gabapentin (NEURONTIN) capsule 300 mg  300 mg Oral [id number] Saramma Eappen, MD   300 mg at 10/27/15 0818  . hydrOXYzine (ATARAX/VISTARIL) tablet 25 mg  25 mg Oral Q6H PRN Kerry HoughSpencer E Simon, PA-C   25 mg at 10/27/15 [id number]  . ibuprofen (ADVIL,MOTRIN) tablet 800 mg  800 mg Oral Q8H PRN Jomarie LongsSaramma Eappen, MD   800 mg at 10/27/15 0821  . magnesium hydroxide (MILK OF MAGNESIA) suspension 30 mL  30 mL Oral Daily PRN Kerry HoughSpencer E Simon, PA-C      . nicotine (NICODERM CQ - dosed in mg/24 hours) patch 21 mg  21 mg Transdermal Daily Kerry HoughSpencer E Simon, PA-C   21 mg at 10/25/15 0820  . QUEtiapine (SEROQUEL) tablet 450 mg  450 mg Oral QHS Jomarie LongsSaramma Eappen, MD   450 mg at 10/26/15 2303  . traZODone (DESYREL) tablet 50 mg  50 mg Oral QHS PRN Jomarie LongsSaramma Eappen, MD        PTA Medications: Prescriptions Prior to Admission  Medication Sig Dispense Refill Last Dose  . FLUoxetine (PROZAC) 20 MG capsule Take 1 capsule (20 mg total) by mouth daily. For depression 30 capsule 0 10/17/2015  . hydrOXYzine (ATARAX/VISTARIL) 25 MG tablet Take 1 tablet (25 mg total) by mouth every 6 (six) hours  as needed for anxiety (Sleep). 60 tablet 0 10/17/2015  . ibuprofen (ADVIL,MOTRIN) 800 MG tablet Take 1 tablet (800 mg total) by mouth 3 (three) times daily. 21 tablet 0 10/16/2015  . QUEtiapine (SEROQUEL) 400 MG tablet Take 1 tablet (400 mg total) by mouth at bedtime. For mood control 30 tablet 0 10/07/2015    Treatment Modalities: Medication Management, Group therapy, Case management,  1 to 1 session with clinician, Psychoeducation, Recreational therapy.   Physician Treatment Plan for Primary Diagnosis: Schizoaffective disorder, bipolar type (HCC) Long Term Goal(s): Improvement in symptoms so as ready for discharge  Short Term Goals: Compliance with prescribed medications will improve  Medication Management: Evaluate patient's response, side effects, and tolerance of medication regimen.  Therapeutic Interventions: 1 to 1 sessions, Unit Group sessions and Medication administration.  Evaluation of Outcomes: Adequate for Discharge  Physician Treatment Plan for Secondary Diagnosis: Principal Problem:   Schizoaffective disorder, bipolar type (HCC) Active Problems:   Cocaine use disorder, severe, dependence (HCC)   Long Term Goal(s): Improvement in symptoms so as ready for discharge  Short Term Goals: Ability to identify triggers associated with substance abuse/mental health issues will improve  Medication Management: Evaluate patient's response, side effects, and tolerance of medication regimen.  Therapeutic Interventions: 1 to 1 sessions, Unit Group sessions and Medication administration.  Evaluation of Outcomes: Adequate for Discharge  RN Treatment Plan for Primary Diagnosis: Schizoaffective disorder, bipolar type (HCC) Long Term Goal(s): Knowledge of disease and therapeutic regimen to maintain health will improve  Short Term Goals: Ability to disclose and discuss suicidal ideas and Compliance with prescribed medications will improve  Medication Management: RN will administer  medications as ordered by provider, will assess and evaluate patient's response and provide education to patient for prescribed medication. RN will report any adverse and/or side effects to prescribing provider.  Therapeutic Interventions: 1 on 1 counseling sessions, Psychoeducation, Medication administration, Evaluate responses to treatment, Monitor vital signs and CBGs as ordered, Perform/monitor CIWA, COWS, AIMS and Fall Risk screenings as ordered, Perform wound care treatments as ordered.  Evaluation of Outcomes: Adequate for Discharge   LCSW Treatment Plan for Primary Diagnosis: Schizoaffective disorder, bipolar type (HCC) Long Term Goal(s): Safe transition to appropriate next level of care at discharge, Engage patient in therapeutic group addressing interpersonal concerns.  Short Term Goals: Increase emotional regulation and Identify triggers associated with mental health/substance abuse issues  Therapeutic Interventions: Assess for all discharge needs, 1 to 1 time with Social worker, Explore available resources and support systems, Assess for adequacy in community support network, Educate family and significant other(s) on suicide prevention, Complete Psychosocial Assessment, Interpersonal group therapy.  Evaluation of Outcomes: Adequate for Discharge   Progress in Treatment: Attending groups: Yes Participating in groups: Yes Taking medication as prescribed: Yes, MD continues to assess for medication changes as needed Toleration medication: Yes, no side effects reported at this time Family/Significant other contact made: Yes Patient understands diagnosis: Yes, by requesting treatment for Depression, SI, AH and referrals to inpatient rehab.  Discussing patient identified problems/goals with staff: Yes Medical problems stabilized or resolved: Yes Denies suicidal/homicidal ideation: No, patient endorses SI Issues/concerns per patient self-inventory: None Other: N/A  New problem(s)  identified: None identified at this time.   New Short Term/Long Term Goal(s): None identified at this time.   Discharge Plan or Barriers: Patient is hoping to discharge to an inpatient rehab facility. Has interview with [facility] today.  Will either go there tomorrow, or return home and follow up [facility]  Reason for Continuation of Hospitalization:   Medication stabilization  Estimated Length of Stay: D/C tomorrow  Attendees: Patient: 10/27/2015  8:44 AM  Physician: Dr. Elna Breslow 10/27/2015  8:44 AM  Nursing: Erskine Squibb 10/27/2015  8:44 AM  RN Care Manager: Onnie Boer 10/27/2015  8:44 AM  Social Worker: Richelle Ito, LCSW 10/27/2015  8:44 AM  Recreational Therapist: Aggie Cosier 10/27/2015  8:44 AM  Other: Baldo Daub, Social Work Intern  10/27/2015  8:44 AM  Other:  10/27/2015  8:44 AM  Other: 10/27/2015  8:44 AM    Scribe for Treatment Team: Baldo Daub, Social Work Intern 10/27/2015 8:44 AM

## 2015-10-27 NOTE — Progress Notes (Signed)
Recreation Therapy Notes  Date: 10/27/15 Time: 1000 Location: 500 Hall Dayroom  Group Topic: Wellness  Goal Area(s) Addresses:  Patient will define components of whole wellness. Patient will verbalize benefit of whole wellness.  Intervention: UnitedHealthBeach ball, chairs  Activity: Keep it ContractorGoing Volleyball.  Patients were arranged in [name] circle.  Patients were to hit [name] volleyball back and forth.  The beach ball could bounce off the ground but it could not roll to [name] stop.  LRT will count the number times the patients were able to pass the ball before it rolls to [name] stop on the floor.  Education: Wellness, Building control surveyorDischarge Planning.   Education Outcome: Acknowledges education/In group clarification offered/Needs additional education.   Clinical Observations/Feedback: Pt did not attend group.   Lonnie [name], Lonnie [name]         Lonnie [name], Lonnie [name] 10/27/2015 12:32 PM

## 2015-10-27 NOTE — [facility] Group Notes (Signed)
[facility] LCSW Group Therapy  10/27/2015 1:15 pm  Type of Therapy: Process Group Therapy  Participation Level:  Active  Participation Quality:  Appropriate  Affect:  Flat  Cognitive:  Oriented  Insight:  Improving  Engagement in Group:  Limited  Engagement in Therapy:  Limited  Modes of Intervention:  Activity, Clarification, Education, Problem-solving and Support  Summary of Progress/Problems: Today's group addressed the issue of overcoming obstacles.  Patients were asked to identify their biggest obstacle post d/c that stands in the way of their on-going success, and then problem solve as to how to manage this.  Invited.  Chose to not attend.  Lonnie [name], [name] B 10/27/2015   2:27 PM

## 2015-10-27 NOTE — Progress Notes (Signed)
D:  Patient's self inventory sheet, patient sleeps good, no sleep medication given.  Good appetite, high energy level.  Rated depression 5, hopeless 3, anxiety 10.  Withdrawals, cravings.  Denied SI.  Physical problems, pain, back.  Pain, worst pain in past 24 hours is #3, pain medication is helpful.  Goal is to stay positive.  Plans to let staff know how he is feeling.  No discharge plans. A:  Medications administered per MD orders.  Emotional support and encouragement given patient. R:  Denied SI and HI, contracts for safety.  Denied A/V hallucinations.  Safety maintained with 15 minute checks.

## 2015-10-27 NOTE — Progress Notes (Signed)
Patient stated he is worried about where he will go after discharge.  Stated when he is frustrated he sleeps.  Will try not to worry about put his problems in God's hands.  Patient denied SI and Hi, contracts for safety.  Denied A/V hallucinations.
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
## 2015-10-27 NOTE — Progress Notes (Signed)
Writer spoke with patient in the dayroom 1:1 after he woke up . He reported that he had been up all day and attended all his groups. He reports that he had not napped today and was a little tired. He ate a snack and was compliant with his medications. He reported that he has not heard voices since being here. He plans to attend AA/NA classes once discharged but needs to find one in his area. Writer encouraged him to speak with his Child psychotherapistsocial worker about his concern. Support given and safety maintained on unit with 15 min checks.

## 2015-10-27 NOTE — Progress Notes (Signed)
D: Lonnie AguasHoward has been sleeping most of the evening.  Denies SI/HI/AVH at this time. Contracts for safety.   A: Encouragement and support given. Q15 minute room checks for patient safety. Medications administered as prescribed.   R: Continue to monitor for patient safety and medication effectiveness.

## 2015-10-27 NOTE — Plan of Care (Signed)
Problem: Education: Goal: Knowledge of the prescribed therapeutic regimen will improve Outcome: Progressing Nurse discussed depression/coping skills with patient.        

## 2015-10-28 MED ORDER — QUETIAPINE FUMARATE 50 MG PO TABS: 450.0000 mg | ORAL_TABLET | Freq: Every day | ORAL | 0 refills | Status: AC

## 2015-10-28 MED ORDER — QUETIAPINE FUMARATE 400 MG PO TABS
400.0000 mg | ORAL_TABLET | Freq: Every day | ORAL | Status: DC
  Filled 2015-10-28: qty 20

## 2015-10-28 MED ORDER — HYDROXYZINE HCL 25 MG PO TABS: 25.0000 mg | ORAL_TABLET | Freq: Four times a day (QID) | ORAL | 0 refills | Status: AC | PRN

## 2015-10-28 MED ORDER — QUETIAPINE FUMARATE 50 MG PO TABS
50.0000 mg | ORAL_TABLET | Freq: Every day | ORAL | Status: DC
  Filled 2015-10-28: qty 20

## 2015-10-28 MED ORDER — IBUPROFEN 800 MG PO TABS: 800.0000 mg | ORAL_TABLET | Freq: Three times a day (TID) | ORAL | 0 refills | Status: DC | PRN

## 2015-10-28 MED ORDER — TRAZODONE HCL 50 MG PO TABS: 50.0000 mg | ORAL_TABLET | Freq: Every evening | ORAL | 0 refills | Status: AC | PRN

## 2015-10-28 MED ORDER — GABAPENTIN 300 MG PO CAPS: 300.0000 mg | ORAL_CAPSULE | ORAL | 0 refills | Status: AC

## 2015-10-28 MED ORDER — DIVALPROEX SODIUM ER 500 MG PO TB24: 1000.0000 mg | ORAL_TABLET | Freq: Every day | ORAL | 0 refills | Status: AC

## 2015-10-28 MED ORDER — NICOTINE 21 MG/24HR TD PT24: 21.0000 mg | MEDICATED_PATCH | Freq: Every day | TRANSDERMAL | 0 refills | Status: DC

## 2015-10-28 NOTE — Progress Notes (Signed)
Recreation Therapy Notes  Date: 10/28/15 Time: 1000 Location: 500 Hall Dayroom  Group Topic: Coping Skills  Goal Area(s) Addresses:  Pt will be able to identify positive coping skills. Pt will be able to identify the benefits of positive coping skills. Pt will be able to identify how things will be different if positive coping skills are used post d/c.  Intervention: Financial traderWeb worksheet, colored pencils  Activity: OrthoptistWeb Design.  Patients were given a worksheet with a Orthoptistweb design on it.  Patients were to identify the things and situations they are dealing with that have them "stuck".  Patients were to then come up with coping skills they could use to help them get "unstuck" from the circumstances they are facing.  Education: PharmacologistCoping Skills, Building control surveyorDischarge Planning.   Education Outcome: Acknowledges understanding/In group clarification offered/Needs additional education.   Clinical Observations/Feedback: Pt did not attend group.    Caroll RancherMarjette Demetrius Barrell, LRT/CTRS         Caroll RancherLindsay, Ashantee Deupree A 10/28/2015 12:03 PM

## 2015-10-28 NOTE — [facility] Suicide Risk Assessment (Signed)
[facility]   Principal Problem: Schizoaffective disorder, bipolar type [facility](HCC) Discharge Diagnoses:  Patient Active Problem List   Diagnosis Date Noted  . Cocaine use disorder, severe, dependence (HCC) [F14.20] 10/22/2015  . Schizoaffective disorder, bipolar type (HCC) [F25.0] 04/08/2015  . TIA (transient ischemic attack) [G45.9] 08/09/2013  . Hematemesis [K92.0] 07/30/2013  . GI bleed [K92.2] 07/30/2013    Total Time spent with patient: 30 minutes  Musculoskeletal: Strength & Muscle Tone: within normal limits Gait & Station: normal Patient leans: N/A  Psychiatric Specialty Exam: Review of Systems  Psychiatric/Behavioral: Positive for substance abuse. Negative for depression and suicidal ideas.  All other systems reviewed and are negative.   Blood pressure 137/88, pulse (!) 105, temperature 97.6 F (36.4 C), temperature source Oral, resp. rate 16, height 5\' 11"  (1.803 m), weight 93.4 kg (206 lb), SpO2 100 %.Body mass index is 28.73 kg/m.  General Appearance: Casual  Eye Contact::  Fair  Speech:  Clear and Coherent409  Volume:  Normal  Mood:  Euthymic  Affect:  Appropriate  Thought Process:  Goal Directed and Descriptions of Associations: Intact  Orientation:  Full (Time, Place, and Person)  Thought Content:  Logical  Suicidal Thoughts:  No  Homicidal Thoughts:  No  Memory:  Immediate;   Fair Recent;   Fair Remote;   Fair  Judgement:  Fair  Insight:  Fair  Psychomotor Activity:  Normal  Concentration:  Fair  Recall:  [employer]Fair  Fund of Knowledge:Fair  Language: Fair  Akathisia:  No  Handed:  Right  AIMS (if indicated):     Assets:  Desire for Improvement  Sleep:  Number of Hours: 6  Cognition: WNL  ADL's:  Intact   Mental Status Per Nursing Assessment::   On Admission:  Suicidal ideation indicated by patient, Suicide plan, Plan includes specific time, place, or method, Self-harm thoughts, Self-harm behaviors, Intention to act on suicide  plan, Belief that plan would result in death  Demographic Factors:  Male  Loss Factors: NA  Historical Factors: Impulsivity  Risk Reduction Factors:   Positive coping skills or problem solving skills  Continued Clinical Symptoms:  Alcohol/Substance Abuse/Dependencies  Cognitive Features That Contribute To Risk:  None    Suicide Risk:  Minimal: No identifiable suicidal ideation.  Patients presenting with no risk factors but with morbid ruminations; may be classified as minimal risk based on the severity of the depressive symptoms  Follow-up Information    [facility] Follow up on 10/30/2015.   Why:  Screening for possible admission on Thursday Oct. 5th at 7:45am. Bring [place] ID, social security card, 2 week supply of medications. Call if you need to reschedule.  Contact information: [place] [place] [address] [phone]        [facility] .   Specialty:  Behavioral Health Why:  Go to the walk-in clinc M-F between 8-11AM after [facility] up appointment. Contact information: [address] [place] [address] [phone]           Plan Of Care/Follow-up recommendations:  Activity:  no restrictions Other:  none  Marri Mcneff, MD 10/28/2015, 9:56 AM

## 2015-10-28 NOTE — Progress Notes (Signed)
  [facility] :  Will you be returning to the same living situation after discharge:  Yes,  home At discharge, do you have transportation home?: Yes,  friend Do you have the ability to pay for your medications: Yes,  mental health  Release of information consent forms completed and in the chart;  Patient's signature needed at discharge.  Patient to Follow up at: Follow-up Information    [facility] Follow up on 10/30/2015.   Why:  Screening for possible admission on Thursday Oct. 5th at 7:45am. Bring [place] ID, social security card, 2 week supply of medications. Call if you need to reschedule.  Contact information: [place] [place] [address] [phone]        [facility] .   Specialty:  Behavioral Health Why:  Go to the walk-in clinc M-F between 8-11AM after you [facility] of [facility] for [facility] hospital follow up appointment. Contact information: [address] [place] [address] [phone]           Next level of care provider has [facility] to [facility] Link:no  Safety Planning and Suicide Prevention discussed: Yes,  yes  Have you used any form of tobacco in the last 30 days? (Cigarettes, Smokeless Tobacco, Cigars, and/or Pipes): Yes  Has patient been referred to the Quitline?: Patient refused referral  Patient has been referred for addiction treatment: Yes  Lonnie RogueRodney B [name] 10/28/2015, 9:29 AM
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
## 2015-10-28 NOTE — Progress Notes (Signed)
D: Pt present with depressed affect and mood. Limited but ppropriate interactions observed with others on unit.  Denies SI, HI, AVH aned pain. Pt d/c home as ordered and was picked up in the main lobby by her husband.  A: Scheduled medications administered as prescribed. D/C instructions reviewed with pt including prescriptions, medications samples and [facility]. Support and availability provided to pt. Encouraged to comply with d/c instructions. All belongings in locker 28 returned to pt at time of departure. Q 15 minutes safety checks maintained till time of d/c. R: Pt receptive to care. Compliant with medications when offered. Denies adverse drug reactions when assessed. Verbalized understanding related to d/c instructions. Signed belonging sheet in agreement with items received from locker. No physical distress evident at present. Q 15 minutes safety checks remains effective on and off unit till time of d/c without incident.

## 2016-02-02 ENCOUNTER — Emergency Department: Payer: Self-pay

## 2016-02-02 ENCOUNTER — Encounter: Payer: Self-pay | Admitting: Emergency Medicine

## 2016-02-02 ENCOUNTER — Emergency Department
Admission: EM | Admit: 2016-02-02 | Discharge: 2016-02-03 | Disposition: A | Discharge status: Home / Self Care | Payer: Self-pay | Attending: Student in an Organized Health Care Education/Training Program | Admitting: Student in an Organized Health Care Education/Training Program

## 2016-02-02 DIAGNOSIS — E119 Type 2 diabetes mellitus without complications: Secondary | ICD-10-CM | POA: Insufficient documentation

## 2016-02-02 DIAGNOSIS — F17210 Nicotine dependence, cigarettes, uncomplicated: Secondary | ICD-10-CM | POA: Insufficient documentation

## 2016-02-02 DIAGNOSIS — G9340 Encephalopathy, unspecified: Secondary | ICD-10-CM

## 2016-02-02 DIAGNOSIS — T50901A Poisoning by unspecified drugs, medicaments and biological substances, accidental (unintentional), initial encounter: Secondary | ICD-10-CM

## 2016-02-02 DIAGNOSIS — R400 Somnolence: Secondary | ICD-10-CM

## 2016-02-02 DIAGNOSIS — Z79899 Other long term (current) drug therapy: Secondary | ICD-10-CM | POA: Insufficient documentation

## 2016-02-02 DIAGNOSIS — I10 Essential (primary) hypertension: Secondary | ICD-10-CM | POA: Insufficient documentation

## 2016-02-02 LAB — GLUCOSE, CAPILLARY: Glucose-Capillary: 134 mg/dL — ABNORMAL HIGH (ref 65–99)

## 2016-02-02 LAB — COMPREHENSIVE METABOLIC PANEL
ALBUMIN: 3.9 g/dL (ref 3.5–5.0)
ALK PHOS: 46 U/L (ref 38–126)
ALT: 32 U/L (ref 17–63)
ANION GAP: 7 (ref 5–15)
AST: 31 U/L (ref 15–41)
BILIRUBIN TOTAL: 0.9 mg/dL (ref 0.3–1.2)
BUN: 18 mg/dL (ref 6–20)
CALCIUM: 8.9 mg/dL (ref 8.9–10.3)
CO2: 25 mmol/L (ref 22–32)
CREATININE: 1.46 mg/dL — AB (ref 0.61–1.24)
Chloride: 104 mmol/L (ref 101–111)
GFR calc Af Amer: >60 mL/min (ref >60)
GFR calc non Af Amer: 55 mL/min — ABNORMAL LOW (ref >60)
GLUCOSE: 112 mg/dL — AB (ref 65–99)
Potassium: 4.6 mmol/L (ref 3.5–5.1)
Sodium: 136 mmol/L (ref 135–145)
Total Protein: 7.3 g/dL (ref 6.5–8.1)

## 2016-02-02 MED ORDER — SODIUM CHLORIDE 0.9 % IV BOLUS (SEPSIS)
1000.0000 mL | Freq: Once | INTRAVENOUS | Status: AC
  Administered 2016-02-02: 1000 mL via INTRAVENOUS

## 2016-02-02 MED ORDER — NALOXONE HCL 2 MG/2ML IJ SOSY: 0.4000 mg | PREFILLED_SYRINGE | Freq: Once | INTRAMUSCULAR | Status: DC

## 2016-02-02 MED ORDER — NALOXONE HCL 2 MG/2ML IJ SOSY
PREFILLED_SYRINGE | INTRAMUSCULAR | Status: AC
  Administered 2016-02-02: 1 mg via INTRAVENOUS
  Filled 2016-02-02: qty 2

## 2016-02-02 MED ORDER — NALOXONE HCL 2 MG/2ML IJ SOSY
1.0000 mg | PREFILLED_SYRINGE | Freq: Once | INTRAMUSCULAR | Status: AC
  Administered 2016-02-02: 1 mg via INTRAVENOUS

## 2016-02-02 MED ORDER — SODIUM CHLORIDE 0.9 % IV BOLUS (SEPSIS): 1000.0000 mL | Freq: Once | INTRAVENOUS | Status: DC

## 2016-02-02 NOTE — ED Notes (Addendum)
Patient experiencing periods of apnea with O2 dropping to 83% on room air. Patient arousable to painful stimulation only. MD made aware. New orders to be placed. Will continue to monitor. Patient placed on 10L non-rebreather.
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
## 2016-02-02 NOTE — ED Triage Notes (Signed)
Patient from [facility] via [facility]. EMS reports patient was found unresponsive at 2200. Patient reports getting his 400mg  of seroquel and 50mg  trazadone at 9pm with he reports makes him extremely tired. Patient is slow to respond to questions but alert to name only. EMS reports giving 1 mg of narcan en route with increased responsiveness noted.

## 2016-02-03 LAB — ACETAMINOPHEN LEVEL

## 2016-02-03 LAB — URINE DRUG SCREEN, QUALITATIVE (ARMC ONLY)
Amphetamines, Ur Screen: NOT DETECTED
BARBITURATES, UR SCREEN: NOT DETECTED
BENZODIAZEPINE, UR SCRN: NOT DETECTED
CANNABINOID 50 NG, UR ~~LOC~~: NOT DETECTED
Cocaine Metabolite,Ur ~~LOC~~: NOT DETECTED
MDMA (Ecstasy)Ur Screen: NOT DETECTED
METHADONE SCREEN, URINE: NOT DETECTED
OPIATE, UR SCREEN: NOT DETECTED
PHENCYCLIDINE (PCP) UR S: NOT DETECTED
Tricyclic, Ur Screen: POSITIVE — AB

## 2016-02-03 LAB — CBC WITH DIFFERENTIAL/PLATELET
BASOS ABS: 0 10*3/uL (ref 0–0.1)
Basophils Relative: 1 %
EOS ABS: 0.1 10*3/uL (ref 0–0.7)
Eosinophils Relative: 2 %
HCT: 45.2 % (ref 40.0–52.0)
Hemoglobin: 14.8 g/dL (ref 13.0–18.0)
LYMPHS PCT: 39 %
Lymphs Abs: 2 10*3/uL (ref 1.0–3.6)
MCH: 28.1 pg (ref 26.0–34.0)
MCHC: 32.7 g/dL (ref 32.0–36.0)
MCV: 86 fL (ref 80.0–100.0)
MONO ABS: 0.5 10*3/uL (ref 0.2–1.0)
Monocytes Relative: 9 %
Neutro Abs: 2.5 10*3/uL (ref 1.4–6.5)
Neutrophils Relative %: 49 %
Platelets: 280 10*3/uL (ref 150–440)
RBC: 5.26 MIL/uL (ref 4.40–5.90)
RDW: 15 % — ABNORMAL HIGH (ref 11.5–14.5)
WBC: 5.1 10*3/uL (ref 3.8–10.6)

## 2016-02-03 LAB — SALICYLATE LEVEL: Salicylate Lvl: <7 mg/dL (ref 2.8–30.0)

## 2016-02-03 NOTE — ED Notes (Addendum)
Patient responsive to voice at this time. Patient assisted to stand at bedside to obtain urine sample. Patient is able to answer questions appropriately and states that he was given his normal dose of nightly meds at [facility] tonight. Reports he usually goes straight to bed after getting them but tonight wanted to stay up to watch the game. Patient trialed on room air. O2 stayed at 93% while patient was standing. Upon returning to sleep, O2 dropped to 88%. Patient placed back on 2L nasal cannula. MD made aware. Will continue to monitor.

## 2016-02-03 NOTE — Discharge Instructions (Signed)
You were seen tonight because you could not wake up apparently due to taking all of your sleeping medicines at one time.  He required oxygen for a period of a few hours in the emergency department because of how sleepy you were when you arrived.  We offered admission but you adamantly refused and since you are much more awake and alert at this time we feel that is appropriate.  Please return to [facility].

## 2016-02-03 NOTE — ED Notes (Signed)
Patient in bed with eyes closed. Respirations even. Patient responds to painful stimuli only. Unable to answer questions. Will continue to monitor.

## 2016-02-03 NOTE — ED Provider Notes (Signed)
  Clinical Course as of Feb 02 402  Sheral FlowMon Feb 02, 2016  2337 Spoke with poison control. They stated there are no documented reports that would suggest any cross reaction other than just known multiple medications with sedating effects. I do suspect that this is the cause of the patient's drowsiness. CT head is currently pending. Glucose is normal. No evidence of sepsis. We'll continue to monitor.  [PR]  2356 Patient reached up and grabbed nasal trumpet. Still drowsy but clearly protecting his airway. Therefore do not feel" to the patient emergently indicated at this time.  [PR]  2357 At this point I do suspect medication effect. Patient will be signed out to Dr. York CeriseForbach forcontinued monitoring and clinical sobriety.  [PR]  Tue Feb 03, 2016  0319 Three hours have passed and the patient is persistently somnolent but doing well on 2 L of oxygen.  He awakens to light stimulation and was even able to stand up briefly to urinate although then he fell back to sleep.  He has had no cardiac events and does not need telemetry.  I called and clarified all of this with the hospitalist, Dr. Tobi BastosPyreddy, who was under the impression the patient needed a CCU better telemetry.  I clarified that in my opinion that is not the case and that the patient just needs to be admitted to a regular floor bed where he can receive 2 L of oxygen and be observed to make sure that the medication effect wears off.  Dr. Clent RidgesParrett he said he would come see the patient to evaluate for admission  [CF]  0402 Dr. Tobi BastosPyreddy came down to admit the patient and the patient became belligerent, cursing and yelling and refusing admission.  He has been observed in the emergency department for 5-1/2 hours and is now clinically sober, maintaining his oxygenation, ambulates with a steady gait, and is perfectly awake and alert enough to be hostile to the point that the nurse had to ask the police officers and security representatives to step into the room.  I will  discharge him back to [facility].  [CF]    Clinical Course User Index [CF] Loleta Roseory Torrance Frech, MD [PR] Willy EddyPatrick Robinson, MD       Loleta Roseory Makayah Pauli, MD 02/03/16 [phone]

## 2016-02-03 NOTE — ED Notes (Signed)
Patient responsive to painful stimulus. Able to open eyes on command. Patient is not able to answer questions clearly at this time. Even respirations noted. Will continue to monitor.

## 2016-02-04 ENCOUNTER — Encounter: Payer: Self-pay | Admitting: Emergency Medicine

## 2016-02-04 ENCOUNTER — Emergency Department
Admission: EM | Admit: 2016-02-04 | Discharge: 2016-02-04 | Disposition: A | Discharge status: Home / Self Care | Payer: Federal, State, Local not specified - Other | Attending: Emergency Medicine | Admitting: Emergency Medicine

## 2016-02-04 DIAGNOSIS — F1290 Cannabis use, unspecified, uncomplicated: Secondary | ICD-10-CM | POA: Insufficient documentation

## 2016-02-04 DIAGNOSIS — F1490 Cocaine use, unspecified, uncomplicated: Secondary | ICD-10-CM | POA: Insufficient documentation

## 2016-02-04 DIAGNOSIS — J069 Acute upper respiratory infection, unspecified: Secondary | ICD-10-CM | POA: Insufficient documentation

## 2016-02-04 DIAGNOSIS — Z79899 Other long term (current) drug therapy: Secondary | ICD-10-CM | POA: Insufficient documentation

## 2016-02-04 DIAGNOSIS — E119 Type 2 diabetes mellitus without complications: Secondary | ICD-10-CM | POA: Insufficient documentation

## 2016-02-04 DIAGNOSIS — F17210 Nicotine dependence, cigarettes, uncomplicated: Secondary | ICD-10-CM | POA: Insufficient documentation

## 2016-02-04 DIAGNOSIS — B349 Viral infection, unspecified: Secondary | ICD-10-CM | POA: Insufficient documentation

## 2016-02-04 DIAGNOSIS — I10 Essential (primary) hypertension: Secondary | ICD-10-CM | POA: Insufficient documentation

## 2016-02-04 LAB — RAPID INFLUENZA A&B ANTIGENS: Influenza A (ARMC): NEGATIVE

## 2016-02-04 LAB — RAPID INFLUENZA A&B ANTIGENS (ARMC ONLY): INFLUENZA B (ARMC): NEGATIVE

## 2016-02-04 MED ORDER — IBUPROFEN 800 MG PO TABS
800.0000 mg | ORAL_TABLET | Freq: Once | ORAL | Status: AC
  Administered 2016-02-04: 800 mg via ORAL
  Filled 2016-02-04: qty 1

## 2016-02-04 MED ORDER — IBUPROFEN 800 MG PO TABS: 800.0000 mg | ORAL_TABLET | Freq: Three times a day (TID) | ORAL | 0 refills | Status: AC | PRN

## 2016-02-04 MED ORDER — PROMETHAZINE-DM 6.25-15 MG/5ML PO SYRP: 5.0000 mL | ORAL_SOLUTION | Freq: Four times a day (QID) | ORAL | 0 refills | Status: AC | PRN

## 2016-02-04 MED ORDER — FLUTICASONE PROPIONATE 50 MCG/ACT NA SUSP: 2.0000 | Freq: Every day | NASAL | 0 refills | Status: AC
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
## 2016-02-04 NOTE — ED Triage Notes (Signed)
Pt to ed with c/o body aches, fever, cough, congestion x 3 days.

## 2016-02-04 NOTE — ED Notes (Signed)
Pt verbalized understanding of d/c instructions, medications and follow up 

## 2016-02-05 ENCOUNTER — Ambulatory Visit: Payer: Self-pay

## 2016-02-05 ENCOUNTER — Ambulatory Visit: Payer: Self-pay | Admitting: Nurse Practitioner

## 2016-02-05 VITALS — BP 130/90 | HR 95 | Temp 98.6°F | Wt 242.6 lb

## 2016-02-05 DIAGNOSIS — F3111 Bipolar disorder, current episode manic without psychotic features, mild: Secondary | ICD-10-CM

## 2016-02-05 DIAGNOSIS — E119 Type 2 diabetes mellitus without complications: Secondary | ICD-10-CM

## 2016-02-05 DIAGNOSIS — R7989 Other specified abnormal findings of blood chemistry: Secondary | ICD-10-CM

## 2016-02-05 LAB — GLUCOSE, POCT (MANUAL RESULT ENTRY): POC Glucose: 77 mg/dl (ref 70–99)

## 2016-02-05 NOTE — Progress Notes (Signed)
Is in a residential treatment center for etoh and cocaine. States sober x 3 months   Was in [facility] on 02/02/2016, for acute encephalopathy, states took his regular meds and was sound asleep  Is on depakote for bipolar d/o, followed by [facility] On seroquel for depression, also helps with sleep  C/o blurry vision, states has never had a full eye exam  Elevated creatinine  Exam:   Alert verbally appropriate, in nad Ap rrr, BBrS clear No carotid bruits   Plan:    Will order eye exam   Will recheck bmp in one month, if creatinine keeps increasing, will do urine with a microalbumin/creatinine ratio at one month point.  , will also consider referral to nephrology.    Will do full comprehensive labs in 3 months

## 2016-02-10 ENCOUNTER — Other Ambulatory Visit: Payer: Self-pay

## 2016-02-10 DIAGNOSIS — R7989 Other specified abnormal findings of blood chemistry: Secondary | ICD-10-CM

## 2016-02-12 ENCOUNTER — Ambulatory Visit: Payer: Self-pay | Admitting: Ophthalmology

## 2016-02-13 LAB — BASIC METABOLIC PANEL
BUN / CREAT RATIO: 12 (ref 9–20)
BUN: 15 mg/dL (ref 6–24)
CO2: 27 mmol/L (ref 18–29)
Calcium: 9.4 mg/dL (ref 8.7–10.2)
Chloride: 98 mmol/L (ref 96–106)
Creatinine, Ser: 1.26 mg/dL (ref 0.76–1.27)
GFR, EST AFRICAN AMERICAN: 77 mL/min/{1.73_m2} (ref >59)
GFR, EST NON AFRICAN AMERICAN: 67 mL/min/{1.73_m2} (ref >59)
Glucose: 101 mg/dL — ABNORMAL HIGH (ref 65–99)
POTASSIUM: 4.7 mmol/L (ref 3.5–5.2)
SODIUM: 139 mmol/L (ref 134–144)

## 2016-02-13 LAB — MICROALBUMIN / CREATININE URINE RATIO
Creatinine, Urine: 128.8 mg/dL
Microalb/Creat Ratio: <2.3 mg/g creat (ref 0.0–30.0)
Microalbumin, Urine: <3 ug/mL

## 2016-02-17 ENCOUNTER — Other Ambulatory Visit: Payer: Self-pay

## 2016-02-17 ENCOUNTER — Other Ambulatory Visit: Payer: Self-pay | Admitting: Adult Health Nurse Practitioner

## 2016-02-17 ENCOUNTER — Ambulatory Visit: Payer: Self-pay | Admitting: Adult Health Nurse Practitioner

## 2016-02-17 VITALS — BP 141/86 | HR 113 | Temp 97.9°F | Wt 245.4 lb

## 2016-02-17 DIAGNOSIS — F250 Schizoaffective disorder, bipolar type: Secondary | ICD-10-CM

## 2016-02-17 DIAGNOSIS — Z0000 Encounter for general adult medical examination without abnormal findings: Secondary | ICD-10-CM

## 2016-02-17 NOTE — Addendum Note (Signed)
Addended by: Elenora GammaHOLT, LORRIE H on: 02/17/2016 06:40 PM   Modules accepted: Orders

## 2016-02-17 NOTE — Progress Notes (Signed)
  Patient: Lonnie [name] Male    DOB: 01/28/1965   51 y.o.   MRN: [id number] Visit Date: 02/17/2016  Today's Provider: Jacelyn Pieah Doles-Johnson, NP   Chief Complaint  Patient presents with  . Follow-up    depco level   . Plantar Warts   Subjective:    HPI   Creatinine improved, back WNL.   Pt concerned about a recurrent plantar's wart that he thinks he will need surgery for.  Pt states he has some pain with walking.   Pt would like depakote level- order in but not done.    BP elevated today.  Last BP 130/90. Pt states he smoked before he came.    Allergies  Allergen Reactions  . Morphine And Related Nausea And Vomiting   Previous Medications   DIVALPROEX (DEPAKOTE ER) 500 MG 24 HR TABLET    Take 2 tablets (1,000 mg total) by mouth at bedtime. For mood stabillization   FLUTICASONE (FLONASE) 50 MCG/ACT NASAL SPRAY    Place 2 sprays into both nostrils daily.   GABAPENTIN (NEURONTIN) 300 MG CAPSULE    Take 1 capsule (300 mg total) by mouth 3 (three) times daily at 8am, 3pm and bedtime. For agitation   HYDROXYZINE (ATARAX/VISTARIL) 25 MG TABLET    Take 1 tablet (25 mg total) by mouth every 6 (six) hours as needed for anxiety (Sleep).   IBUPROFEN (ADVIL,MOTRIN) 800 MG TABLET    Take 1 tablet (800 mg total) by mouth every 8 (eight) hours as needed (pain).   PROMETHAZINE-DEXTROMETHORPHAN (PROMETHAZINE-DM) 6.25-15 MG/5ML SYRUP    Take 5 mLs by mouth 4 (four) times daily as needed for cough.   QUETIAPINE (SEROQUEL) 50 MG TABLET    Take 9 tablets (450 mg total) by mouth at bedtime. For mood control   TRAZODONE (DESYREL) 50 MG TABLET    Take 1 tablet (50 mg total) by mouth at bedtime as needed for sleep.    Review of Systems  All other systems reviewed and are negative.   Social History  Substance Use Topics  . Smoking status: Current Every Day Smoker    Packs/day: 0.00    Types: Cigarettes  . Smokeless tobacco: Former NeurosurgeonUser    Types: Chew  . Alcohol use Yes   Objective:   BP (!)  141/86   Pulse (!) 113   Temp 97.9 F (36.6 C)   Wt 245 lb 6.4 oz (111.3 kg)   BMI 36.24 kg/m   Physical Exam  Constitutional: He is oriented to person, place, and time. He appears well-developed and well-nourished.  HENT:  Head: Normocephalic.  Neck: Normal range of motion. Neck supple.  Cardiovascular: Normal rate, regular rhythm and normal heart sounds.   Pulmonary/Chest: Effort normal and breath sounds normal.  Abdominal: Soft. Bowel sounds are normal.  Neurological: He is alert and oriented to person, place, and time.  Skin: Skin is warm and dry.  Vitals reviewed.       Assessment & Plan:        Instructed patient to sign up for charity care for plantar's wart.   Obtain depakote level today for Psychiatry.   FU in 3 months for comprehensive visit with labs prior to visit.  Re-evaluate BP and need for medications.   Jacelyn Pieah Doles-Johnson, NP   [facility]

## 2016-02-20 ENCOUNTER — Ambulatory Visit: Payer: Self-pay | Admitting: Pharmacy Technician

## 2016-02-20 DIAGNOSIS — Z79899 Other long term (current) drug therapy: Secondary | ICD-10-CM

## 2016-02-23 NOTE — Progress Notes (Signed)
Met with patient completed financial assistance application for [place] due to recent hospital visit.  Patient agreed to be responsible for gathering financial information and forwarding to appropriate department in [facility].    Completed [facility] application and contract.  Patient agreed to all terms of the [facility] contract.  Provided patient with Civil engineer, contracting based on his particular needs.    [place] [facility]

## 2016-02-26 ENCOUNTER — Ambulatory Visit: Payer: Self-pay | Admitting: Ophthalmology

## 2016-03-13 ENCOUNTER — Encounter: Payer: Self-pay | Admitting: Emergency Medicine

## 2016-03-13 ENCOUNTER — Emergency Department
Admission: EM | Admit: 2016-03-13 | Discharge: 2016-03-14 | Disposition: A | Discharge status: Home / Self Care | Payer: Self-pay | Attending: Emergency Medicine | Admitting: Emergency Medicine

## 2016-03-13 ENCOUNTER — Emergency Department: Payer: Self-pay

## 2016-03-13 DIAGNOSIS — Z79899 Other long term (current) drug therapy: Secondary | ICD-10-CM | POA: Insufficient documentation

## 2016-03-13 DIAGNOSIS — S29011A Strain of muscle and tendon of front wall of thorax, initial encounter: Secondary | ICD-10-CM | POA: Insufficient documentation

## 2016-03-13 DIAGNOSIS — Z791 Long term (current) use of non-steroidal anti-inflammatories (NSAID): Secondary | ICD-10-CM | POA: Insufficient documentation

## 2016-03-13 DIAGNOSIS — X500XXA Overexertion from strenuous movement or load, initial encounter: Secondary | ICD-10-CM | POA: Insufficient documentation

## 2016-03-13 DIAGNOSIS — Y93F2 Activity, caregiving, lifting: Secondary | ICD-10-CM | POA: Insufficient documentation

## 2016-03-13 DIAGNOSIS — Y999 Unspecified external cause status: Secondary | ICD-10-CM | POA: Insufficient documentation

## 2016-03-13 DIAGNOSIS — R0789 Other chest pain: Secondary | ICD-10-CM

## 2016-03-13 DIAGNOSIS — E119 Type 2 diabetes mellitus without complications: Secondary | ICD-10-CM | POA: Insufficient documentation

## 2016-03-13 DIAGNOSIS — Y929 Unspecified place or not applicable: Secondary | ICD-10-CM | POA: Insufficient documentation

## 2016-03-13 DIAGNOSIS — F17210 Nicotine dependence, cigarettes, uncomplicated: Secondary | ICD-10-CM | POA: Insufficient documentation

## 2016-03-13 DIAGNOSIS — I10 Essential (primary) hypertension: Secondary | ICD-10-CM | POA: Insufficient documentation

## 2016-03-13 LAB — CBC
HCT: 44.4 % (ref 40.0–52.0)
HEMOGLOBIN: 14.7 g/dL (ref 13.0–18.0)
MCH: 28.1 pg (ref 26.0–34.0)
MCHC: 33.1 g/dL (ref 32.0–36.0)
MCV: 84.9 fL (ref 80.0–100.0)
Platelets: 265 10*3/uL (ref 150–440)
RBC: 5.23 MIL/uL (ref 4.40–5.90)
RDW: 15.3 % — ABNORMAL HIGH (ref 11.5–14.5)
WBC: 7.1 10*3/uL (ref 3.8–10.6)

## 2016-03-13 LAB — BASIC METABOLIC PANEL
Anion gap: 8 (ref 5–15)
BUN: 16 mg/dL (ref 6–20)
CALCIUM: 8.9 mg/dL (ref 8.9–10.3)
CO2: 26 mmol/L (ref 22–32)
Chloride: 105 mmol/L (ref 101–111)
Creatinine, Ser: 1.14 mg/dL (ref 0.61–1.24)
Glucose, Bld: 120 mg/dL — ABNORMAL HIGH (ref 65–99)
Potassium: 4 mmol/L (ref 3.5–5.1)
Sodium: 139 mmol/L (ref 135–145)

## 2016-03-13 LAB — TROPONIN I: Troponin I: <0.03 ng/mL (ref <0.03)

## 2016-03-13 NOTE — ED Notes (Signed)
Pt reports pain to left chest just under left axilla, states he started a new workout routine but has been in new routine for a couple days without pain prior to today.  Pt denies any accompanying symptoms.  Pt reports he takes trazodone and seroquel at night, pt falling asleep during assessment.

## 2016-03-13 NOTE — ED Notes (Signed)
Pt has been taken to xray via wheelchair; to be taken to treatment room 16 when done in radiology

## 2016-03-13 NOTE — ED Notes (Signed)
Pt brought to ED from [facility] with c/o left upper chest pain; pt placed in wheelchair;

## 2016-03-13 NOTE — ED Triage Notes (Signed)
Pt states that he has been experiencing left sided rib pain that he noticed around 2130 this evening. Pt has recently started a new lifting routine during his work out but states that he felt no pain at the time yesterday. Pt is in NAD at this time.

## 2016-03-14 MED ORDER — KETOROLAC TROMETHAMINE 60 MG/2ML IM SOLN
15.0000 mg | INTRAMUSCULAR | Status: AC
  Administered 2016-03-14: 15 mg via INTRAMUSCULAR

## 2016-03-14 MED ORDER — KETOROLAC TROMETHAMINE 30 MG/ML IJ SOLN
INTRAMUSCULAR | Status: AC
  Administered 2016-03-14: 15 mg via INTRAMUSCULAR
  Filled 2016-03-14: qty 1

## 2016-03-14 NOTE — ED Notes (Signed)
Dr. Forbach at bedside.  

## 2016-03-14 NOTE — ED Notes (Signed)
Pt falling asleep with registration, drowsiness due to pt's night medications.  Pt stimulated verbally by this nurse to assist registration get information
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
## 2016-03-14 NOTE — Discharge Instructions (Signed)

## 2016-03-23 ENCOUNTER — Ambulatory Visit: Payer: Self-pay

## 2016-03-24 ENCOUNTER — Ambulatory Visit: Payer: Self-pay | Admitting: Ophthalmology

## 2016-05-11 ENCOUNTER — Other Ambulatory Visit: Payer: Self-pay

## 2016-05-18 ENCOUNTER — Ambulatory Visit: Payer: Self-pay

## 2016-05-19 IMAGING — DX DG MANDIBLE 4+V
4 series · 4 of 4 positions shown · non-contrast
Comparison: None.

CLINICAL DATA: Two day history of left and right, upper and lower,
dental pain. Broken teeth and dental abscess ease.

EXAM:
MANDIBLE - 4+ VIEW

[mandible pa]
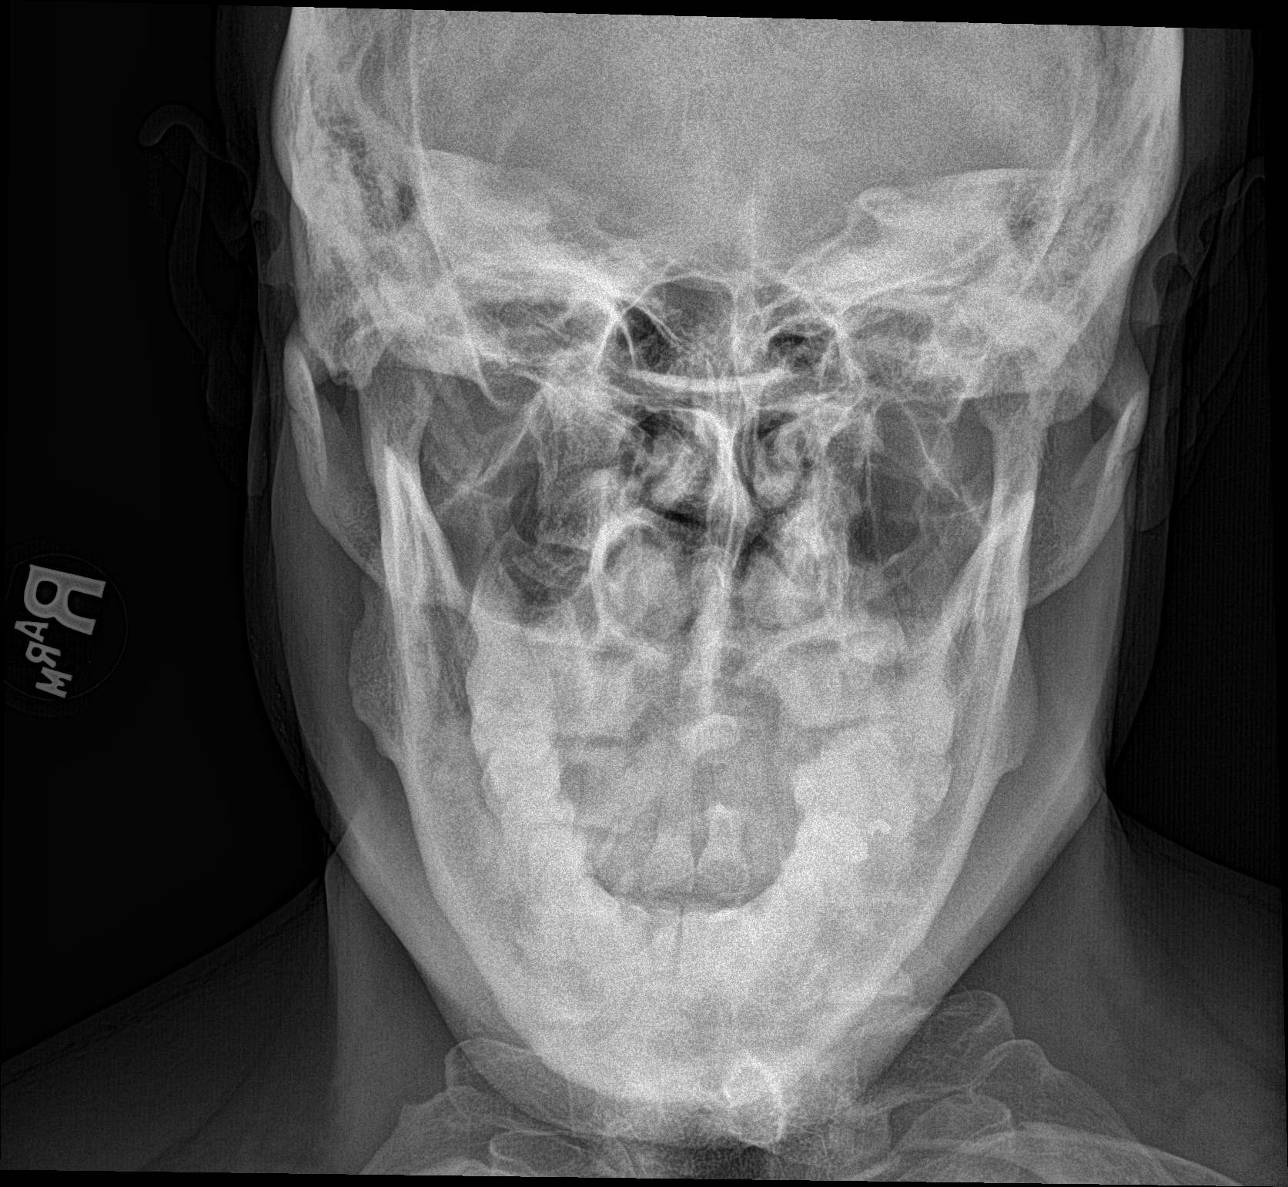

[mandible townes (1 of 3)]
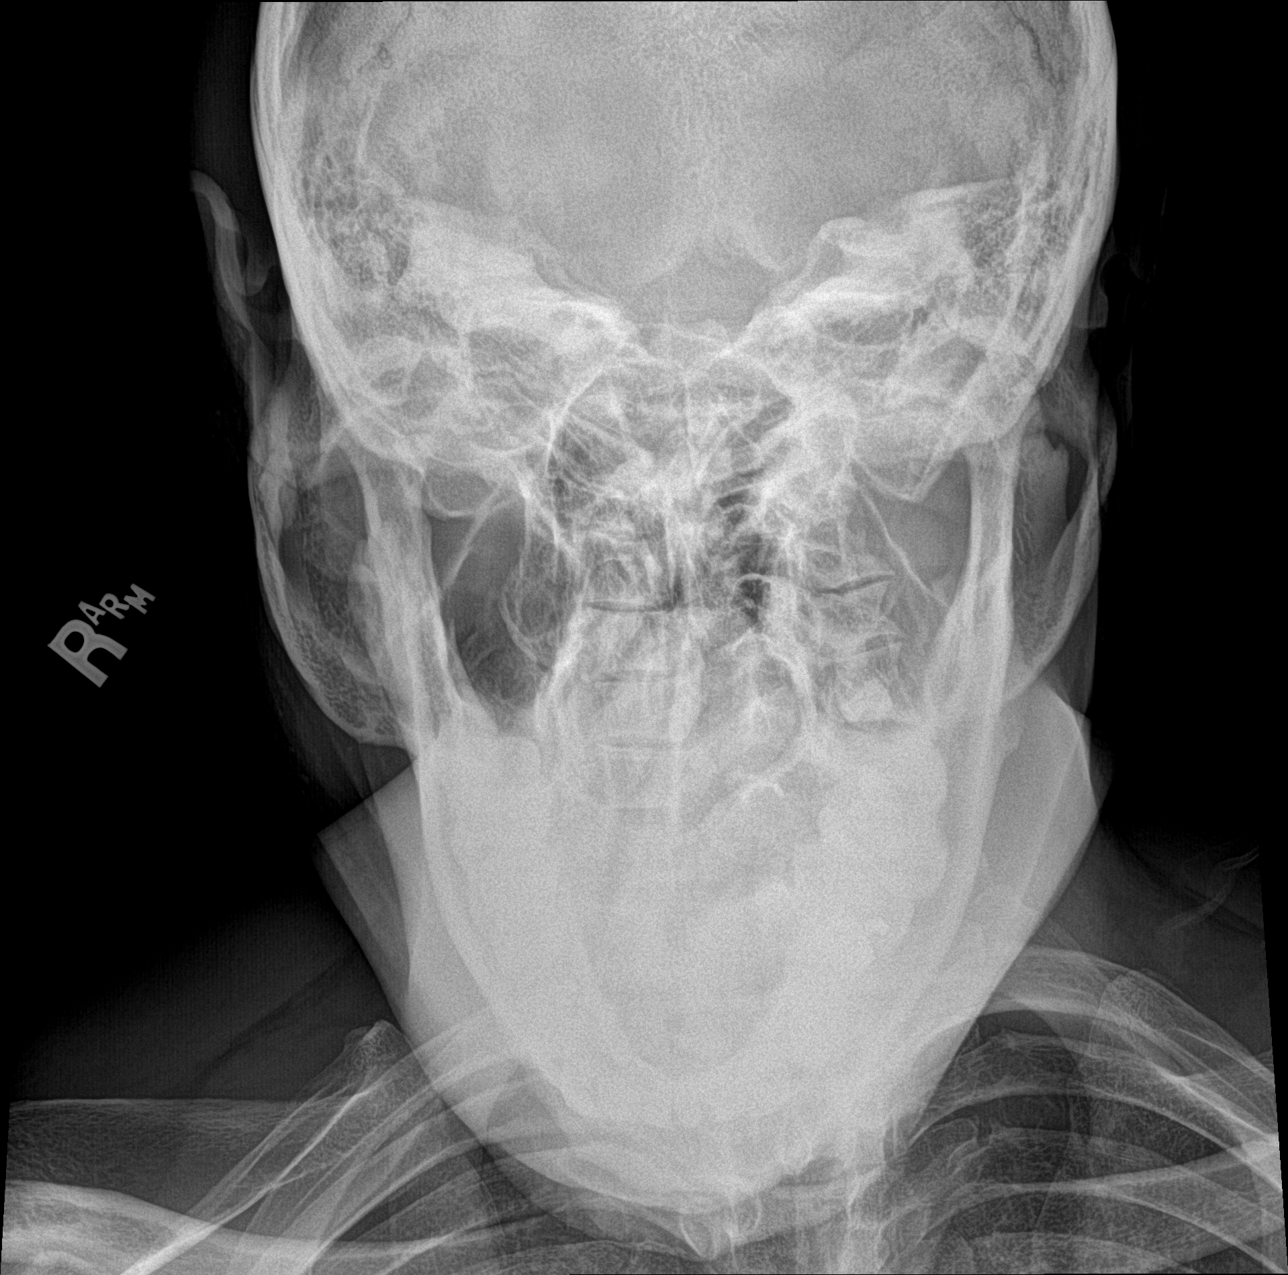

[mandible townes (2 of 3)]
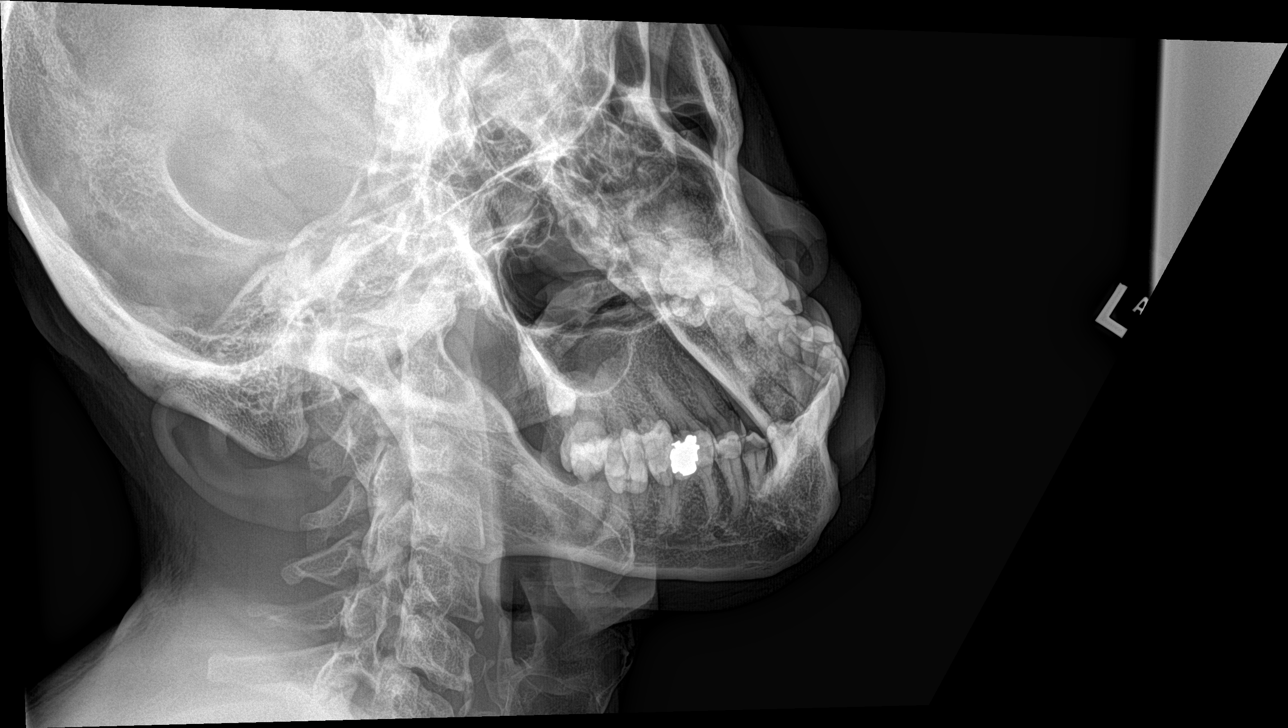

[mandible townes (3 of 3)]
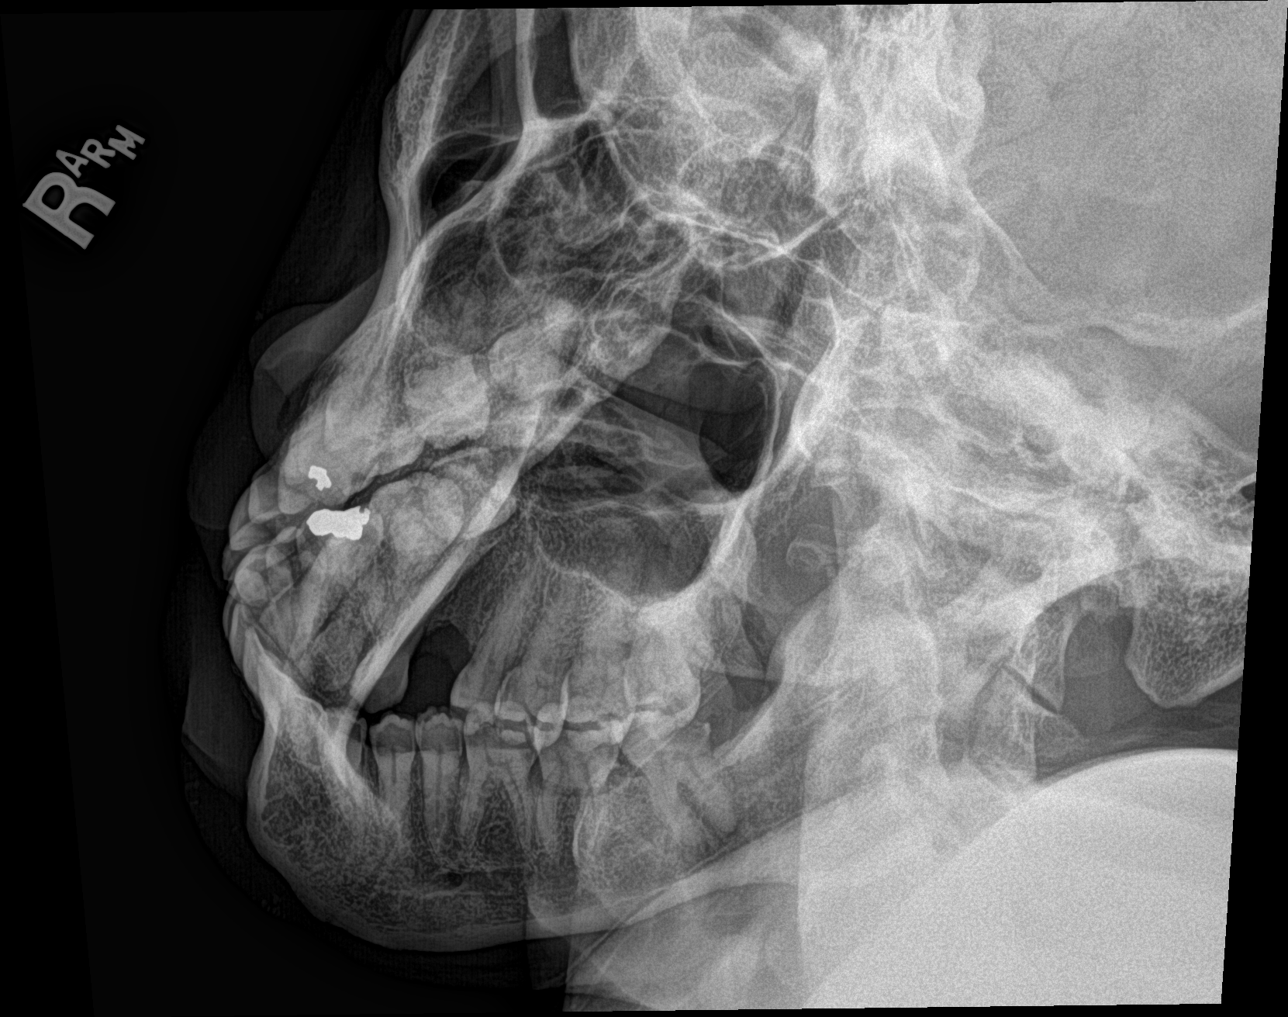

[4 of 4 positions shown; findings below may reference images not displayed]

FINDINGS: Four views of the mandible are provided. The mandible appears intact
and normal in mineralization throughout. No fracture line or
displaced fracture fragment seen. No acute - appearing cortical
irregularity or osseous lesion. No destructive change to suggest
osteomyelitis. No convincing evidence of periodontal abscess.
IMPRESSION: Unremarkable plain film examination of the mandible. No acute
findings.

## 2016-05-23 IMAGING — CT CT ABD-PELV W/ CM
1 of 3 series · 13 of 32 positions shown, 18 images · IV contrast (APPLIED)
Comparison: 11/06/2014 chest, abdomen and pelvis CT angiogram.

CLINICAL DATA: Upper abdominal pain for 3 days. Vomiting and
nausea. Right inguinal hernia repair.

EXAM:
CT ABDOMEN AND PELVIS WITH CONTRAST
TECHNIQUE: Multidetector CT imaging of the abdomen and pelvis was performed
using the standard protocol following bolus administration of
intravenous contrast.
CONTRAST:  25mL OMNIPAQUE IOHEXOL 300 MG/ML SOLN, 100mL OMNIPAQUE
IOHEXOL 300 MG/ML SOLN

[Series 2: abd/pelvis 5.0 b31f · axial · 0.75mm/px · z∈[-487,-82]mm · 13 of 91 slices shown, 18 images]
[im 5/91  soft-tissue]
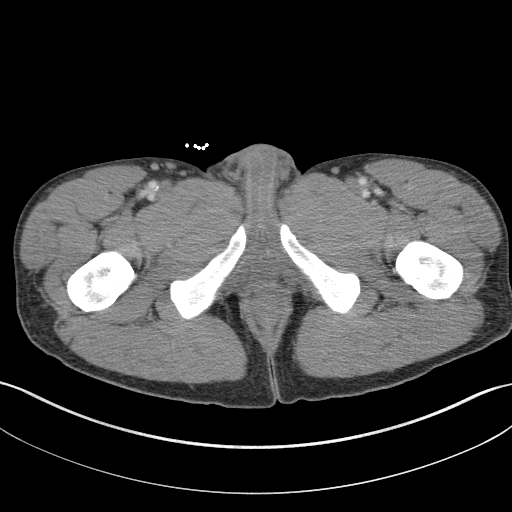
[im 5/91  bone]
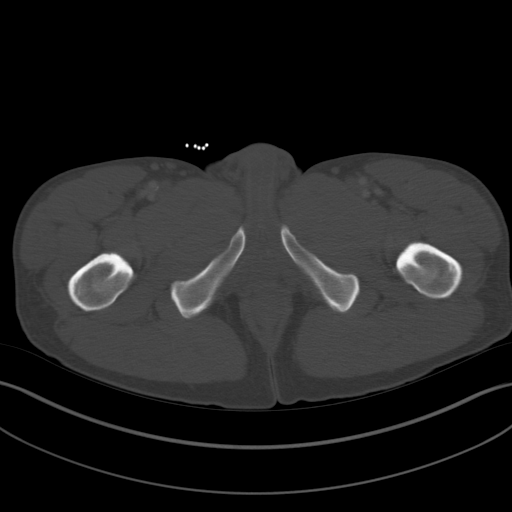
[im 15/91  soft-tissue]
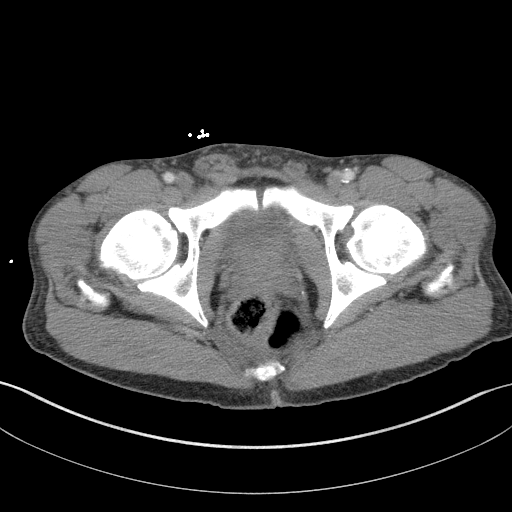
[im 19/91  soft-tissue]
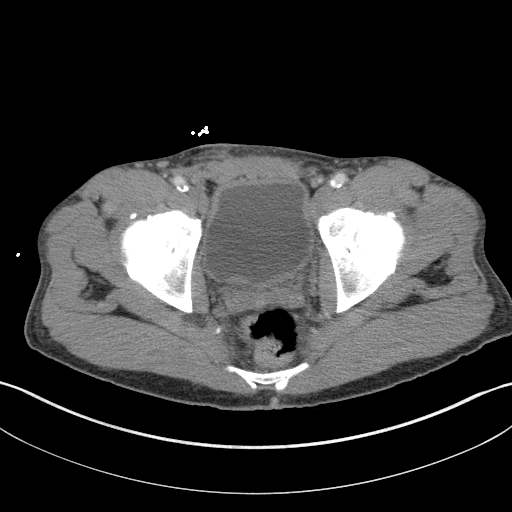
[im 29/91  soft-tissue]
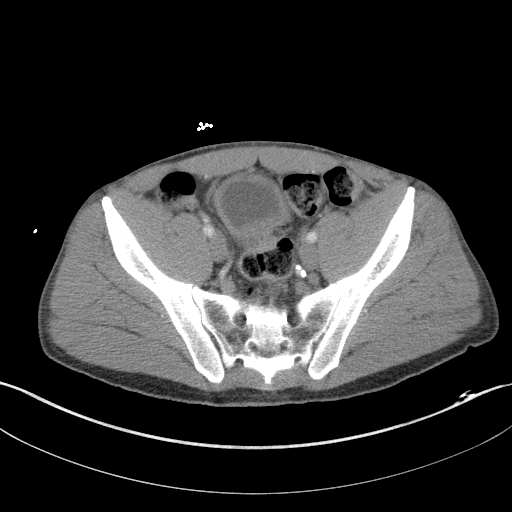
[im 34/91  soft-tissue]
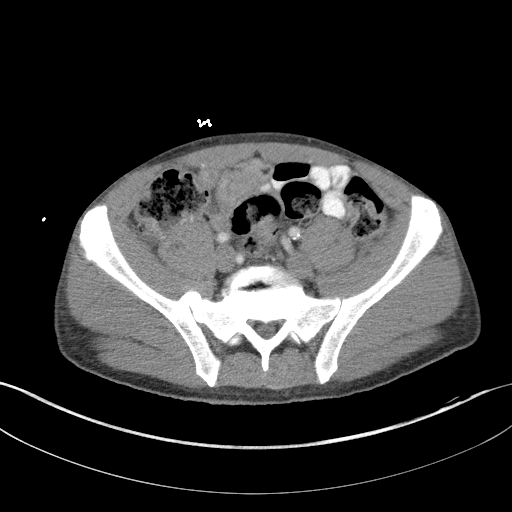
[im 43/91  soft-tissue]
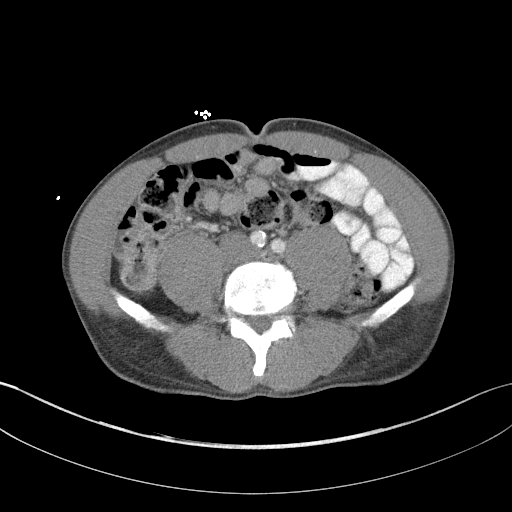
[im 48/91  soft-tissue]
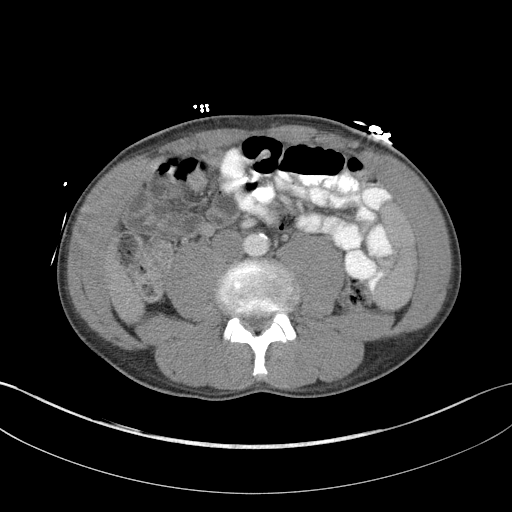
[im 57/91  soft-tissue]
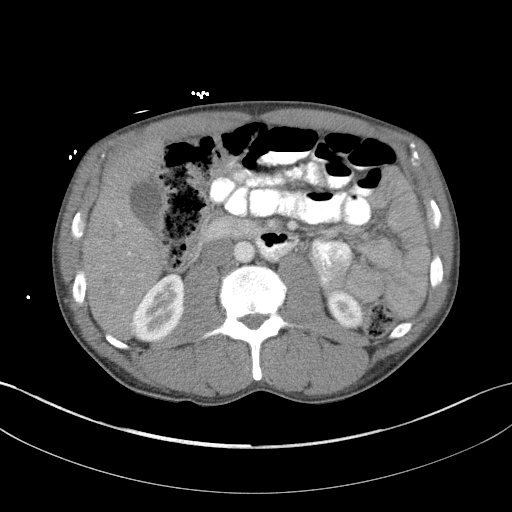
[im 62/91  soft-tissue]
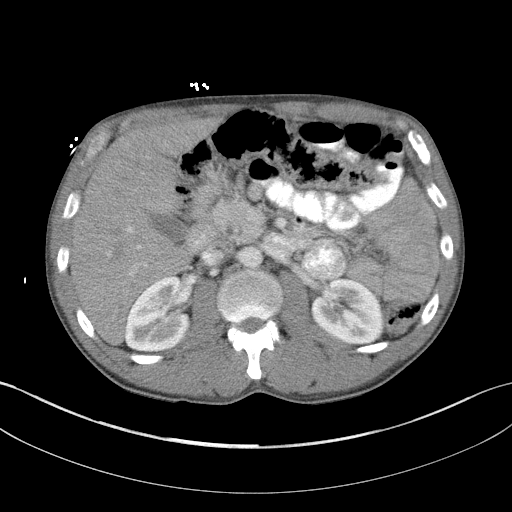
[im 62/91  bone]
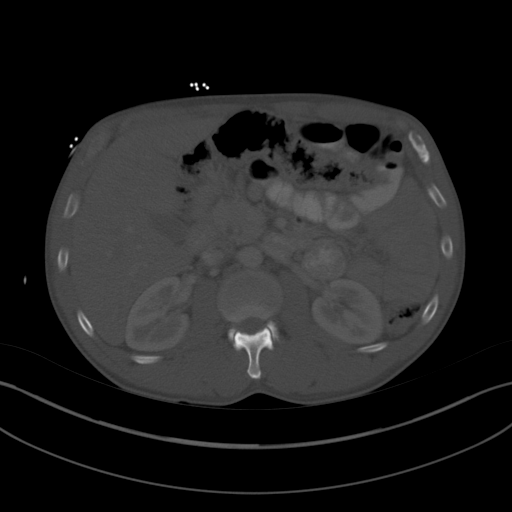
[im 72/91  soft-tissue]
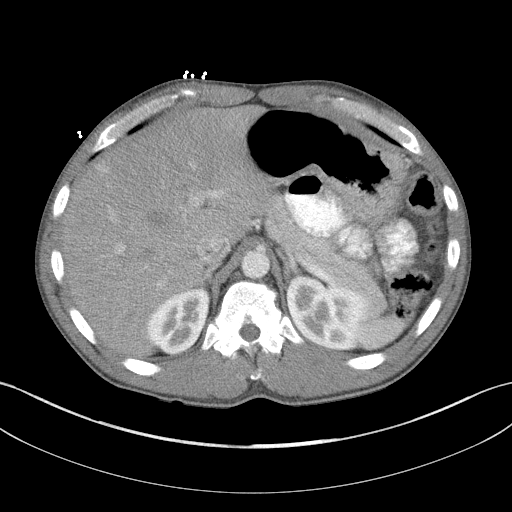
[im 72/91  lung]
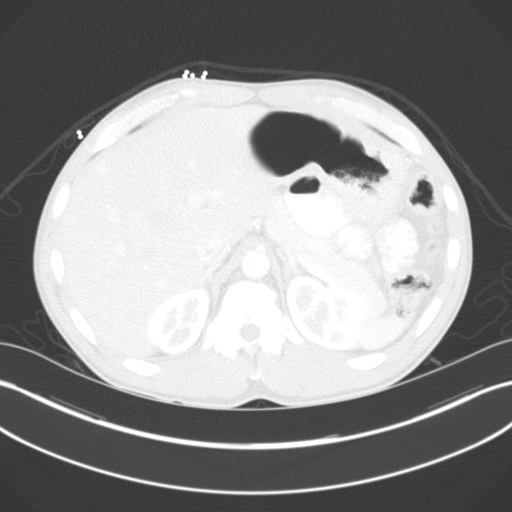
[im 76/91  soft-tissue]
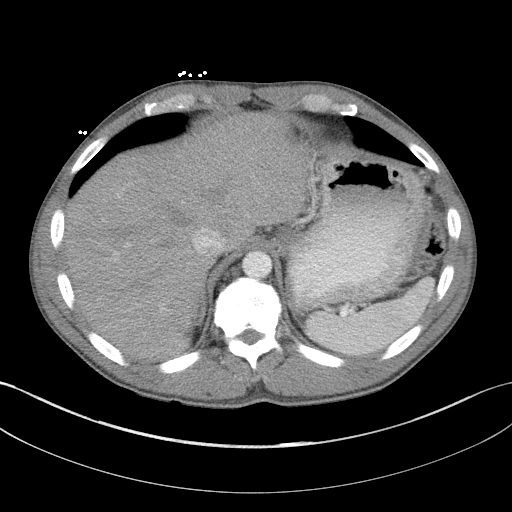
[im 76/91  lung]
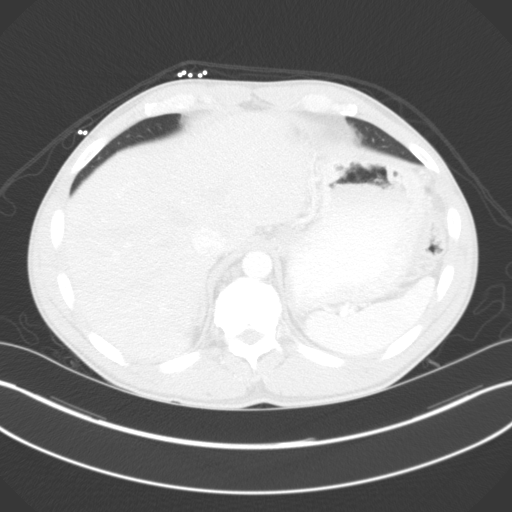
[im 81/91  lung]
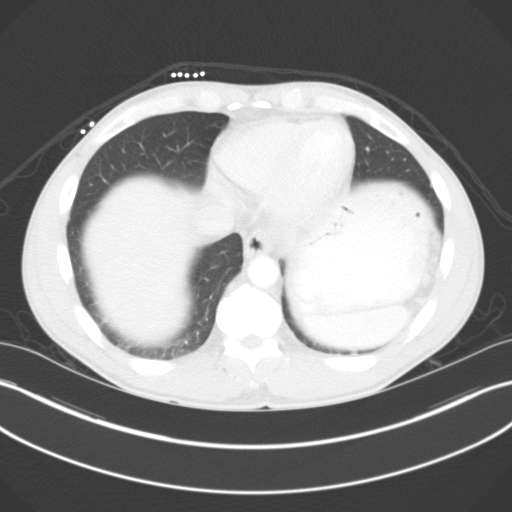
[im 86/91  soft-tissue]
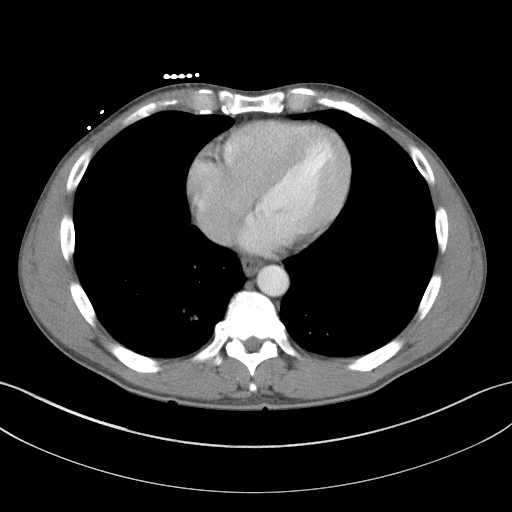
[im 86/91  lung]
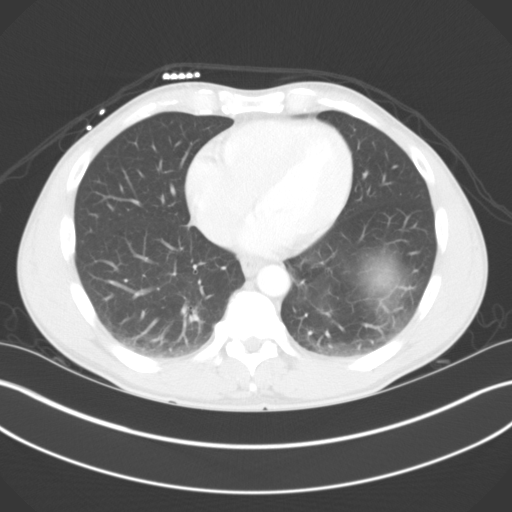

[13 of 32 positions shown; findings below may reference images not displayed]

FINDINGS: Lower chest: No significant pulmonary nodules or acute consolidative
airspace disease. Hypoventilatory changes in the dependent lung
bases.

Hepatobiliary: Normal liver with no liver mass. Normal gallbladder
with no radiopaque cholelithiasis. No biliary ductal dilatation.

Pancreas: Normal, with no mass or duct dilation.

Spleen: Normal size. No mass.

Adrenals/Urinary Tract: Normal adrenals. Normal kidneys with no
hydronephrosis and no renal mass. Stable mild diffuse bladder wall
thickening.

Stomach/Bowel: Grossly normal stomach. Normal caliber small bowel
with no small bowel wall thickening. Normal appendix. Normal large
bowel with no diverticulosis, large bowel wall thickening or
pericolonic fat stranding.

Vascular/Lymphatic: Mildly atherosclerotic nonaneurysmal abdominal
aorta. Patent portal, splenic, hepatic and renal veins. No
pathologically enlarged lymph nodes in the abdomen or pelvis.

Reproductive: Stable top-normal size prostate.

Other: No pneumoperitoneum, ascites or focal fluid collection.

Musculoskeletal: Mild-to-moderate degenerative changes in the
visualized thoracolumbar spine, most prominent at L5-S1. Asymmetric
degenerative change in the right sacroiliac joint. Stable mild
osteoarthritis in the weight-bearing portions of both hip joints.
Stable postsurgical changes from prior right inguinal hernia repair,
with no evidence of recurrent inguinal hernia.
IMPRESSION: 1. No acute abnormality. No evidence of acute pancreatitis. No
evidence of bowel perforation, bowel obstruction or acute bowel
inflammation. Normal appendix.
2. Stable postsurgical changes from right inguinal hernia repair,
with no evidence of a recurrent inguinal hernia.
3. Suggestion of stable chronic mild diffuse bladder wall
thickening, possibly indicating a nonspecific chronic bladder
voiding dysfunction. Prostate is top-normal size. Consider
correlation with urinalysis.

## 2017-04-29 ENCOUNTER — Telehealth: Payer: Self-pay | Admitting: Pharmacy Technician

## 2017-04-29 NOTE — Telephone Encounter (Signed)
Patient failed to provide 2019 financial documentation.  No additional medication assistance will be provided by [facility] without the required proof of income documentation.  Patient notified by letter.  Taila Basinski J. Khamil Lamica Care Manager [facility] 

## 2017-08-04 ENCOUNTER — Ambulatory Visit: Payer: Self-pay | Admitting: Pharmacy Technician

## 2017-08-04 DIAGNOSIS — Z79899 Other long term (current) drug therapy: Secondary | ICD-10-CM

## 2017-08-04 NOTE — Progress Notes (Signed)
Completed [facility] application and contract.  Patient agreed to all terms of the [facility] contract.    Patient approved to receive medication assistance through 2019, as long as eligibility criteria continues to be met.    Provided patient with Civil engineer, contracting based on his particular needs.    [place] [facility]

## 2017-08-29 ENCOUNTER — Telehealth: Payer: Self-pay | Admitting: Pharmacy Technician

## 2017-10-17 ENCOUNTER — Ambulatory Visit: Payer: Self-pay | Admitting: Pharmacist

## 2017-10-24 NOTE — Progress Notes (Signed)
[name] did not show for this appointment. We will try to reschedule the [facility].  Lonnie [name], PharmD [facility]-Pharmacy Operations Coordinator [phone]

## 2018-01-09 ENCOUNTER — Telehealth: Payer: Self-pay | Admitting: Pharmacy Technician

## 2018-01-09 NOTE — Telephone Encounter (Signed)
Patient has Medicaid.  No longer meets [facility]'s eligibility criteria.  Patient notified by letter.  Lonnie [name] J. Jermiyah Ricotta Care Manager [facility] 

## 2020-04-15 NOTE — Telephone Encounter (Signed)
To close telephone encounter
